# Patient Record
Sex: Female | Born: 1960 | Race: Black or African American | Hispanic: No | Marital: Married | Smoking: Current every day smoker
Health system: Southern US, Community
[De-identification: ages and names within clinical notes are randomized; demographics above are authoritative.]

## PROBLEM LIST (undated history)

## (undated) DIAGNOSIS — F319 Bipolar disorder, unspecified: Secondary | ICD-10-CM

## (undated) DIAGNOSIS — R569 Unspecified convulsions: Secondary | ICD-10-CM

## (undated) SURGERY — Surgical Case
Anesthesia: *Unknown

---

## 2013-08-31 LAB — COMPREHENSIVE METABOLIC PANEL
Alkaline Phosphatase: 115 U/L (ref 50–136)
Calcium, Total: 8.8 mg/dL (ref 8.5–10.1)
Co2: 23 mmol/L (ref 21–32)
EGFR (African American): >60
EGFR (Non-African Amer.): >60
Glucose: 84 mg/dL (ref 65–99)
Osmolality: 266 (ref 275–301)
Potassium: 3.7 mmol/L (ref 3.5–5.1)
SGOT(AST): 84 U/L — ABNORMAL HIGH (ref 15–37)
SGPT (ALT): 58 U/L (ref 12–78)

## 2013-08-31 LAB — CBC
HGB: 11.9 g/dL — ABNORMAL LOW (ref 12.0–16.0)
MCH: 26.1 pg (ref 26.0–34.0)
MCV: 81 fL (ref 80–100)
Platelet: 467 10*3/uL — ABNORMAL HIGH (ref 150–440)
RBC: 4.55 10*6/uL (ref 3.80–5.20)
WBC: 10.9 10*3/uL (ref 3.6–11.0)

## 2013-08-31 LAB — ETHANOL
Ethanol %: 0.127 % — ABNORMAL HIGH (ref 0.000–0.080)
Ethanol: 127 mg/dL

## 2013-08-31 LAB — URINALYSIS, COMPLETE
Bacteria: NONE SEEN
Bilirubin,UR: NEGATIVE
Glucose,UR: NEGATIVE mg/dL (ref 0–75)
Specific Gravity: 1.003 (ref 1.003–1.030)
Squamous Epithelial: 3

## 2013-08-31 LAB — DRUG SCREEN, URINE
Amphetamines, Ur Screen: NEGATIVE (ref ?–1000)
Cocaine Metabolite,Ur ~~LOC~~: NEGATIVE (ref ?–300)
Phencyclidine (PCP) Ur S: NEGATIVE (ref ?–25)
Tricyclic, Ur Screen: NEGATIVE (ref ?–1000)

## 2013-08-31 LAB — ACETAMINOPHEN LEVEL: Acetaminophen: <2 ug/mL

## 2013-08-31 LAB — VALPROIC ACID LEVEL: Valproic Acid: 18 ug/mL — ABNORMAL LOW

## 2013-09-01 ENCOUNTER — Inpatient Hospital Stay: Payer: Self-pay | Admitting: Psychiatry

## 2013-09-05 LAB — VALPROIC ACID LEVEL: Valproic Acid: 74 ug/mL
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
## 2020-01-23 ENCOUNTER — Ambulatory Visit
Admission: EM | Admit: 2020-01-23 | Discharge: 2020-01-23 | Disposition: A | Discharge status: Home / Self Care | Payer: Medicaid Other | Attending: Family Medicine | Admitting: Family Medicine

## 2020-01-23 ENCOUNTER — Other Ambulatory Visit: Payer: Self-pay

## 2020-01-23 ENCOUNTER — Encounter: Payer: Self-pay | Admitting: Emergency Medicine

## 2020-01-23 DIAGNOSIS — M25511 Pain in right shoulder: Secondary | ICD-10-CM | POA: Diagnosis not present

## 2020-01-23 DIAGNOSIS — T148XXA Other injury of unspecified body region, initial encounter: Secondary | ICD-10-CM

## 2020-01-23 DIAGNOSIS — M879 Osteonecrosis, unspecified: Secondary | ICD-10-CM | POA: Diagnosis not present

## 2020-01-23 HISTORY — DX: Bipolar disorder, unspecified: F31.9

## 2020-01-23 HISTORY — DX: Unspecified convulsions: R56.9

## 2020-01-23 MED ORDER — KETOROLAC TROMETHAMINE 10 MG PO TABS: 10.0000 mg | ORAL_TABLET | Freq: Four times a day (QID) | ORAL | 0 refills | Status: DC | PRN

## 2020-01-23 MED ORDER — TRIAMCINOLONE ACETONIDE 0.1 % EX OINT: 1.0000 "application " | TOPICAL_OINTMENT | Freq: Two times a day (BID) | CUTANEOUS | 0 refills | Status: DC
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
## 2020-01-23 NOTE — Discharge Instructions (Signed)
Rest.  Sling for comfort.  Pain medication as direct.  Please call [facility] [phone]) OR [facility] [phone]) for an appt.  Take care  Dr. Lacinda Axon

## 2020-03-22 ENCOUNTER — Ambulatory Visit: Payer: Medicaid Other | Attending: Family Medicine | Admitting: Physical Therapy

## 2020-04-01 ENCOUNTER — Ambulatory Visit: Payer: Medicaid Other | Admitting: Physical Therapy

## 2020-04-08 ENCOUNTER — Encounter: Payer: Medicaid Other | Admitting: Physical Therapy

## 2020-04-15 ENCOUNTER — Encounter: Payer: Medicaid Other | Admitting: Physical Therapy

## 2020-04-22 ENCOUNTER — Encounter: Payer: Medicaid Other | Admitting: Physical Therapy

## 2020-04-29 ENCOUNTER — Encounter: Payer: Medicaid Other | Admitting: Physical Therapy

## 2020-05-06 ENCOUNTER — Encounter: Payer: Medicaid Other | Admitting: Physical Therapy

## 2020-08-27 ENCOUNTER — Ambulatory Visit: Payer: Self-pay

## 2020-08-27 NOTE — Telephone Encounter (Signed)
Pt. Called to report injury to right foot on Tuesday, 10/26.  Stated a heavy wooden "carosel" rocking horse fell on her foot.  Stated she has not been able to get anywhere to have it x-rayed.  Doesn't have transportation until son comes home tonight from a trip.  Reported too painful to walk on; has been walking on side of foot when she goes to [place].  Has not looked at the foot, since the injury occurred.  Reported she put a double layer of sock on the foot, to help with the pain and swelling.  Reported the foot feels warm to touch.  Stated she cannot move the great toe at all.  Only able to move the 2nd-5th toes with her hand.  Has been applying ice to the foot at intervals.  Rated pain at "12" on 1-10 scale.  Pt. Stated she does not have any transportation to go to UC tonight, when advised.  Is expecting her son to come home later tonight.  Is unsure what time he will get home.  Does not want to go to ER.  Advised to continue to elevate the right foot, and apply ice for only 20 min. Intervals.  Advised to monitor right foot for swelling/ avoid having sock on too tight, as it could decrease blood flow to the foot.  Encouraged to seek ER treatment if pain becomes any worse, or if re-injury occurs.  Pt. Verb. Understanding.  (does not have a PCP; has appt. To establish care in December with a new PCP)      Reason for Disposition . [1] SEVERE pain AND [2] not improved 2 hours after pain medicine/ice packs    Injury to right foot on 10/26; child's rocking horse fell on top of foot.  Answer Assessment - Initial Assessment Questions 1. MECHANISM: "How did the injury happen?" (e.g., twisting injury, direct blow)      A carosel rocking horse fell on right foot 2. ONSET: "When did the injury happen?" (Minutes or hours ago)      08/24/20 3. LOCATION: "Where is the injury located?"      Right foot 4. APPEARANCE of INJURY: "What does the injury look like?"      "I haven't looked at it since the injury on  Tuesday." 5. WEIGHT-BEARING: "Can you put weight on that foot?" "Can you walk (four steps or more)?"       Unable to bear weight on right foot 6. SIZE: For cuts, bruises, or swelling, ask: "How large is it?" (e.g., inches or centimeters;  entire joint)      Denied any cuts; does not know about bruising; has not looked at  7. PAIN: "Is there pain?" If Yes, ask: "How bad is the pain?"    (e.g., Scale 1-10; or mild, moderate, severe)   - NONE (0): no pain.   - MILD (1-3): doesn't interfere with normal activities.    - MODERATE (4-7): interferes with normal activities (e.g., work or school) or awakens from sleep, limping.    - SEVERE (8-10): excruciating pain, unable to do any normal activities, unable to walk.      "12" on scale of 1-10 8. TETANUS: For any breaks in the skin, ask: "When was the last tetanus booster?"      9. OTHER SYMPTOMS: "Do you have any other symptoms?"      Pain right foot ; swelling.  Unsure about bruising; denied any break in skin. 10. PREGNANCY: "Is there any chance you are  pregnant?" "When was your last menstrual period?"       n/a  Protocols used: ANKLE AND FOOT INJURY-A-AH

## 2020-10-08 ENCOUNTER — Ambulatory Visit: Payer: Medicaid Other | Admitting: Family Medicine

## 2020-11-22 ENCOUNTER — Ambulatory Visit: Payer: Self-pay

## 2020-11-23 ENCOUNTER — Encounter: Payer: Self-pay | Admitting: Emergency Medicine

## 2020-11-26 ENCOUNTER — Ambulatory Visit: Payer: Medicaid Other | Admitting: Family Medicine

## 2020-12-27 ENCOUNTER — Ambulatory Visit: Payer: Medicaid Other | Admitting: Family Medicine

## 2021-04-21 ENCOUNTER — Telehealth: Payer: Self-pay

## 2021-04-21 NOTE — Telephone Encounter (Signed)
Received VM from patient. Attempted to call patient back- VM full and unable to leave message. Noted patient is seen by [facility]. Phone call placed to make them aware that patient is attempting to reach Palliative care. VM left

## 2022-05-15 ENCOUNTER — Emergency Department: Payer: Medicaid Other

## 2022-05-15 ENCOUNTER — Encounter: Payer: Self-pay | Admitting: *Deleted

## 2022-05-15 ENCOUNTER — Other Ambulatory Visit: Payer: Self-pay

## 2022-05-15 ENCOUNTER — Inpatient Hospital Stay
Admission: EM | Admit: 2022-05-15 | Discharge: 2022-05-19 | DRG: 871 | Disposition: A | Discharge status: Home / Self Care | Payer: Medicaid Other | Attending: Internal Medicine | Admitting: Internal Medicine

## 2022-05-15 DIAGNOSIS — Z95828 Presence of other vascular implants and grafts: Secondary | ICD-10-CM

## 2022-05-15 DIAGNOSIS — R1310 Dysphagia, unspecified: Secondary | ICD-10-CM | POA: Diagnosis present

## 2022-05-15 DIAGNOSIS — M542 Cervicalgia: Secondary | ICD-10-CM | POA: Diagnosis present

## 2022-05-15 DIAGNOSIS — R6510 Systemic inflammatory response syndrome (SIRS) of non-infectious origin without acute organ dysfunction: Secondary | ICD-10-CM

## 2022-05-15 DIAGNOSIS — G510 Bell's palsy: Secondary | ICD-10-CM | POA: Diagnosis present

## 2022-05-15 DIAGNOSIS — F17210 Nicotine dependence, cigarettes, uncomplicated: Secondary | ICD-10-CM | POA: Diagnosis present

## 2022-05-15 DIAGNOSIS — D75839 Thrombocytosis, unspecified: Secondary | ICD-10-CM | POA: Diagnosis present

## 2022-05-15 DIAGNOSIS — I959 Hypotension, unspecified: Secondary | ICD-10-CM | POA: Diagnosis present

## 2022-05-15 DIAGNOSIS — Z720 Tobacco use: Secondary | ICD-10-CM

## 2022-05-15 DIAGNOSIS — Z9181 History of falling: Secondary | ICD-10-CM

## 2022-05-15 DIAGNOSIS — R110 Nausea: Secondary | ICD-10-CM | POA: Diagnosis present

## 2022-05-15 DIAGNOSIS — Z931 Gastrostomy status: Secondary | ICD-10-CM

## 2022-05-15 DIAGNOSIS — F319 Bipolar disorder, unspecified: Secondary | ICD-10-CM | POA: Diagnosis present

## 2022-05-15 DIAGNOSIS — Z91138 Patient's unintentional underdosing of medication regimen for other reason: Secondary | ICD-10-CM

## 2022-05-15 DIAGNOSIS — R799 Abnormal finding of blood chemistry, unspecified: Secondary | ICD-10-CM

## 2022-05-15 DIAGNOSIS — C140 Malignant neoplasm of pharynx, unspecified: Secondary | ICD-10-CM

## 2022-05-15 DIAGNOSIS — F17290 Nicotine dependence, other tobacco product, uncomplicated: Secondary | ICD-10-CM | POA: Diagnosis present

## 2022-05-15 DIAGNOSIS — Z79899 Other long term (current) drug therapy: Secondary | ICD-10-CM

## 2022-05-15 DIAGNOSIS — Z1152 Encounter for screening for COVID-19: Secondary | ICD-10-CM

## 2022-05-15 DIAGNOSIS — R531 Weakness: Principal | ICD-10-CM

## 2022-05-15 DIAGNOSIS — D630 Anemia in neoplastic disease: Secondary | ICD-10-CM | POA: Diagnosis present

## 2022-05-15 DIAGNOSIS — Z79891 Long term (current) use of opiate analgesic: Secondary | ICD-10-CM

## 2022-05-15 DIAGNOSIS — Z681 Body mass index (BMI) 19 or less, adult: Secondary | ICD-10-CM

## 2022-05-15 DIAGNOSIS — Z85828 Personal history of other malignant neoplasm of skin: Secondary | ICD-10-CM

## 2022-05-15 DIAGNOSIS — C7989 Secondary malignant neoplasm of other specified sites: Secondary | ICD-10-CM | POA: Diagnosis present

## 2022-05-15 DIAGNOSIS — I82C12 Acute embolism and thrombosis of left internal jugular vein: Secondary | ICD-10-CM | POA: Diagnosis present

## 2022-05-15 DIAGNOSIS — E876 Hypokalemia: Secondary | ICD-10-CM | POA: Diagnosis present

## 2022-05-15 DIAGNOSIS — E860 Dehydration: Secondary | ICD-10-CM | POA: Diagnosis present

## 2022-05-15 DIAGNOSIS — I871 Compression of vein: Secondary | ICD-10-CM | POA: Diagnosis present

## 2022-05-15 DIAGNOSIS — R296 Repeated falls: Secondary | ICD-10-CM | POA: Diagnosis present

## 2022-05-15 DIAGNOSIS — G40909 Epilepsy, unspecified, not intractable, without status epilepticus: Secondary | ICD-10-CM | POA: Diagnosis present

## 2022-05-15 DIAGNOSIS — E43 Unspecified severe protein-calorie malnutrition: Secondary | ICD-10-CM | POA: Diagnosis present

## 2022-05-15 DIAGNOSIS — G893 Neoplasm related pain (acute) (chronic): Secondary | ICD-10-CM | POA: Diagnosis present

## 2022-05-15 DIAGNOSIS — Z9221 Personal history of antineoplastic chemotherapy: Secondary | ICD-10-CM

## 2022-05-15 DIAGNOSIS — I96 Gangrene, not elsewhere classified: Secondary | ICD-10-CM | POA: Diagnosis present

## 2022-05-15 DIAGNOSIS — E46 Unspecified protein-calorie malnutrition: Secondary | ICD-10-CM | POA: Insufficient documentation

## 2022-05-15 DIAGNOSIS — T402X6A Underdosing of other opioids, initial encounter: Secondary | ICD-10-CM | POA: Diagnosis present

## 2022-05-15 DIAGNOSIS — A419 Sepsis, unspecified organism: Principal | ICD-10-CM | POA: Diagnosis present

## 2022-05-15 DIAGNOSIS — C051 Malignant neoplasm of soft palate: Secondary | ICD-10-CM | POA: Diagnosis present

## 2022-05-15 LAB — CBC WITH DIFFERENTIAL/PLATELET
Abs Immature Granulocytes: 0.08 10*3/uL — ABNORMAL HIGH (ref 0.00–0.07)
Basophils Absolute: 0.1 10*3/uL (ref 0.0–0.1)
Basophils Relative: 0 %
Eosinophils Absolute: 0.1 10*3/uL (ref 0.0–0.5)
Eosinophils Relative: 1 %
HCT: 33.2 % — ABNORMAL LOW (ref 36.0–46.0)
Hemoglobin: 11.1 g/dL — ABNORMAL LOW (ref 12.0–15.0)
Immature Granulocytes: 1 %
Lymphocytes Relative: 8 %
Lymphs Abs: 1 10*3/uL (ref 0.7–4.0)
MCH: 32.7 pg (ref 26.0–34.0)
MCHC: 33.4 g/dL (ref 30.0–36.0)
MCV: 97.9 fL (ref 80.0–100.0)
Monocytes Absolute: 1.3 10*3/uL — ABNORMAL HIGH (ref 0.1–1.0)
Monocytes Relative: 11 %
Neutro Abs: 10 10*3/uL — ABNORMAL HIGH (ref 1.7–7.7)
Neutrophils Relative %: 79 %
Platelets: 463 10*3/uL — ABNORMAL HIGH (ref 150–400)
RBC: 3.39 MIL/uL — ABNORMAL LOW (ref 3.87–5.11)
RDW: 14.1 % (ref 11.5–15.5)
WBC: 12.5 10*3/uL — ABNORMAL HIGH (ref 4.0–10.5)
nRBC: 0 % (ref 0.0–0.2)

## 2022-05-15 LAB — COMPREHENSIVE METABOLIC PANEL
ALT: 7 U/L (ref 0–44)
AST: 13 U/L — ABNORMAL LOW (ref 15–41)
Albumin: 2.8 g/dL — ABNORMAL LOW (ref 3.5–5.0)
Alkaline Phosphatase: 137 U/L — ABNORMAL HIGH (ref 38–126)
Anion gap: 11 (ref 5–15)
BUN: 7 mg/dL — ABNORMAL LOW (ref 8–23)
CO2: 25 mmol/L (ref 22–32)
Calcium: 8.9 mg/dL (ref 8.9–10.3)
Chloride: 99 mmol/L (ref 98–111)
Creatinine, Ser: 0.71 mg/dL (ref 0.44–1.00)
GFR, Estimated: >60 mL/min (ref >60)
Glucose, Bld: 99 mg/dL (ref 70–99)
Potassium: 3.1 mmol/L — ABNORMAL LOW (ref 3.5–5.1)
Sodium: 135 mmol/L (ref 135–145)
Total Bilirubin: 0.3 mg/dL (ref 0.3–1.2)
Total Protein: 7.5 g/dL (ref 6.5–8.1)

## 2022-05-15 LAB — VALPROIC ACID LEVEL: Valproic Acid Lvl: <10 ug/mL — ABNORMAL LOW (ref 50.0–100.0)

## 2022-05-15 LAB — TROPONIN I (HIGH SENSITIVITY): Troponin I (High Sensitivity): 6 ng/L (ref <18)

## 2022-05-15 LAB — PROCALCITONIN: Procalcitonin: 13.13 ng/mL

## 2022-05-15 MED ORDER — SODIUM CHLORIDE 0.9 % IV SOLN: INTRAVENOUS | Status: DC

## 2022-05-15 MED ORDER — IOHEXOL 300 MG/ML  SOLN
75.0000 mL | Freq: Once | INTRAMUSCULAR | Status: AC | PRN
  Administered 2022-05-15: 75 mL via INTRAVENOUS

## 2022-05-15 MED ORDER — ACETAMINOPHEN 325 MG PO TABS
650.0000 mg | ORAL_TABLET | Freq: Once | ORAL | Status: AC
  Administered 2022-05-15: 650 mg via ORAL
  Filled 2022-05-15: qty 2

## 2022-05-15 MED ORDER — SODIUM CHLORIDE 0.9 % IV BOLUS (SEPSIS)
1000.0000 mL | Freq: Once | INTRAVENOUS | Status: AC
  Administered 2022-05-16: 1000 mL via INTRAVENOUS

## 2022-05-15 MED ORDER — MORPHINE SULFATE (PF) 4 MG/ML IV SOLN
4.0000 mg | Freq: Once | INTRAVENOUS | Status: AC
  Administered 2022-05-15: 4 mg via INTRAVENOUS
  Filled 2022-05-15: qty 1

## 2022-05-15 MED ORDER — FENTANYL CITRATE PF 50 MCG/ML IJ SOSY
50.0000 ug | PREFILLED_SYRINGE | Freq: Once | INTRAMUSCULAR | Status: AC
  Administered 2022-05-15: 50 ug via INTRAVENOUS
  Filled 2022-05-15: qty 1

## 2022-05-15 MED ORDER — SODIUM CHLORIDE 0.9 % IV BOLUS
1000.0000 mL | Freq: Once | INTRAVENOUS | Status: AC
  Administered 2022-05-15: 1000 mL via INTRAVENOUS

## 2022-05-15 NOTE — ED Provider Notes (Signed)
[facility] Provider Note    Event Date/Time   First MD Initiated Contact with Patient 05/15/22 1812     (approximate)  History   Chief Complaint: Fall  HPI  Christina Porter is a 61 y.o. female with a past medical history of bipolar, seizure disorder, stage IV throat cancer presents to the emergency department for weakness and falls.  According to the patient she has been feeling more weak at home states she is also had seizures at home.  States she has had increased falls recently as well.  Patient states she has a home health nurse said she had low blood pressure and referred her to the emergency department.  Patient does have a left facial droop and some swelling to the left face/throat that has been present for some time per family.  Patient states she was on chemotherapy and radiation but is no longer receiving treatment.  Patient's son is here with the patient.  Blood pressure currently 72/58.  Physical Exam   Triage Vital Signs: ED Triage Vitals  Enc Vitals Group     BP 05/15/22 1737 (!) 72/58     Pulse Rate 05/15/22 1737 (!) 116     Resp 05/15/22 1737 17     Temp 05/15/22 1737 98.7 F (37.1 C)     Temp Source 05/15/22 1737 Oral     SpO2 05/15/22 1737 98 %     Weight 05/15/22 1740 84 lb (38.1 kg)     Height 05/15/22 1740 '5\' 2"'$  (1.575 m)     Head Circumference --      Peak Flow --      Pain Score 05/15/22 1740 10     Pain Loc --      Pain Edu? --      Excl. in [place]? --     Most recent vital signs: Vitals:   05/15/22 1737  BP: (!) 72/58  Pulse: (!) 116  Resp: 17  Temp: 98.7 F (37.1 C)  SpO2: 98%    General: Awake, no distress.  CV:  Good peripheral perfusion.  Regular rate and rhythm  Resp:  Normal effort.  Equal breath sounds bilaterally.  Abd:  No distention.  Soft, nontender.  No rebound or guarding. Other:  Patient does have swelling of the left throat and jaw area which has been ongoing for some time per patient.   ED  Results / Procedures / Treatments   EKG  EKG viewed and interpreted by myself shows sinus tachycardia at 116 bpm with a narrow QRS, normal axis, prolonged QTc otherwise normal intervals, nonspecific but no concerning ST changes.  RADIOLOGY  I have reviewed and interpreted the CT images of the head I do not see any large bleed on my evaluation. Radiology is read the CT is no acute process. No acute cervical spine finding. Chest x-ray read as negative  MEDICATIONS ORDERED IN ED: Medications  sodium chloride 0.9 % bolus 1,000 mL (has no administration in time range)     IMPRESSION / MDM / ASSESSMENT AND PLAN / ED COURSE  I reviewed the triage vital signs and the nursing notes.  Patient's presentation is most consistent with acute presentation with potential threat to life or bodily function.  Patient presents emergency department for increased falls weakness found to be hypotensive 72/58.  Patient is complaining of chronic pain as well she takes oxycodone at home.  We will IV hydrate, treat pain.  We will check labs given the  increased falls and complaint of seizure we will obtain CT imaging of the head and C-spine will also obtain a chest x-ray.  CT scans of the head C-spine are negative.  Chest x-ray is negative.  Lab work is pending.  We will IV hydrate and treat pain once an IV is established.  We will continue to closely monitor in the emergency department.  Differential is quite broad but would include metabolic or electrolyte abnormality, dehydration, ACS.  Patient's blood pressure has improved nicely with fluids with 1 L fluid now it is currently 258 systolic during my evaluation.  Patient's chemistry is reassuring including normal renal function.  CBC shows slight leukocytosis otherwise no concerning findings.  CT scan of the head and chest x-ray do not show any acute findings.  CT scan of the C-spine does show what appears to be changes in the soft tissue of the neck we will repeat  CT imaging of the neck with contrast to look at the soft tissues.  Given the patient's falls and off balance sensation we will obtain an MRI of the brain to evaluate for metastatic disease.  Patient agreeable to plan of care.  She does state mild headache currently we will dose Tylenol and continue to closely monitor.  Urinalysis is pending as well, troponin pending  I reviewed the patient's last admission note from June at [facility].  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  Patient CT scan today continues to show similar findings.  Patient is feeling better after IV fluids, blood pressures improved, labs overall reassuring.  We will continue to closely monitor we will obtain an MRI with without to ensure no metastatic spread of disease given the patient's new weakness which might necessitate hospitalization otherwise I believe the patient would likely be able to be discharged home with continued home health therapy and follow-up with her doctor and oncology team.  FINAL CLINICAL IMPRESSION(S) / ED DIAGNOSES   Weakness Falls Hypotension   Note:  This document was prepared using Dragon voice recognition software and may include unintentional dictation errors.   Harvest Dark, MD 05/15/22 2311

## 2022-05-15 NOTE — ED Triage Notes (Addendum)
Pt comes via EMs from home with c/o hypotension and out of meds. EMS reports BP-93/64. Also states pt has been out of pain meds for two days. Pt has masses on in mouth per EMs. Pt has droop noted and per EMS that is her normal baseline.

## 2022-05-15 NOTE — ED Triage Notes (Signed)
Pt brought in via ems from home with recent falls, hypotension, hx throat cancer.  Pt has slurred speech and facial droop for 1 week per pt.  Pt reports a headache and difficulty walking.  Pt alert.  Pt has a port.

## 2022-05-15 NOTE — ED Notes (Signed)
Patient transported to MRI at this time. 

## 2022-05-15 NOTE — ED Provider Triage Note (Signed)
  Emergency Medicine Provider Triage Evaluation Note  Christina Porter , a 61 y.o.female,  was evaluated in triage.  Pt complains of recent falls.  Patient states that she has had seizures in the past few days and has had her head several times.  She states approximately a week ago, she noticed the left side of her face was not moving like it should, stating that she now talks " stupid".  She states that she is also out of her pain medication and would like refills.   Review of Systems  Positive: Left-sided facial droop, syncope/seizure, Negative: Denies fever, chest pain, vomiting  Physical Exam   Vitals:   05/15/22 1737  BP: (!) 72/58  Pulse: (!) 116  Resp: 17  Temp: 98.7 F (37.1 C)  SpO2: 98%   Gen:   Awake, no distress   Resp:  Normal effort  MSK:   Moves extremities without difficulty  Other:  Left-sided facial droop, decreased sensation on left side of face.  No neurological deficits in the upper or lower extremities.  Medical Decision Making  Given the patient's initial medical screening exam, the following diagnostic evaluation has been ordered. The patient will be placed in the appropriate treatment space, once one is available, to complete the evaluation and treatment. I have discussed the plan of care with the patient and I have advised the patient that an ED physician or mid-level practitioner will reevaluate their condition after the test results have been received, as the results may give them additional insight into the type of treatment they may need.    Diagnostics: Labs, UA, head/neck CT  Treatments: IV fluids   Teodoro Spray, [place] 05/15/22 1747

## 2022-05-15 NOTE — ED Provider Notes (Incomplete)
11:15 PM  Assumed care at shift change.  Patient here with history of metastatic throat cancer status post chemoradiation in 2022 with recent findings of recurrence in June 2023 followed by heme-onc at [facility] who presents to the emergency department with generalized weakness.  Patient did have hypotension which resolved with a liter of IV fluids.  No infectious symptoms.  No fever here but did have an oral temp of 99.4.  Has been slightly tachycardic.  White count of 12,000.  We will add on a procalcitonin.  Urinalysis, cultures pending.  MRI brain pending to evaluate for metastasis.  1:22 AM  Pt's MRI brain reviewed/interpreted by myself and radiologist and shows no metastasis or other acute abnormality.  Her procalcitonin has come back elevated at 13.  I have added on blood cultures and will give cefepime and vancomycin for possible sepsis.  Chest x-ray is clear.  Urine shows no sign of infection.  We will admit to the hospitalist.  1:43 AM  Consulted and discussed patient's case with hospitalist, Dr. Sidney Ace.  I have recommended admission and consulting physician agrees and will place admission orders.  Patient (and family if present) agree with this plan.   Blood pressure currently 117/73 with a heart rate of 106.  I reviewed all nursing notes, vitals, pertinent previous records.  All labs, EKGs, imaging ordered have been independently reviewed and interpreted by myself.    CRITICAL CARE Performed by: Pryor Curia   Total critical care time: 45 minutes  Critical care time was exclusive of separately billable procedures and treating other patients.  Critical care was necessary to treat or prevent imminent or life-threatening deterioration.  Critical care was time spent personally by me on the following activities: development of treatment plan with patient and/or surrogate as well as nursing, discussions with consultants, evaluation of patient's response to treatment, examination of  patient, obtaining history from patient or surrogate, ordering and performing treatments and interventions, ordering and review of laboratory studies, ordering and review of radiographic studies, pulse oximetry and re-evaluation of patient's condition.     Armiyah Capron, Delice Bison, DO 05/16/22 [phone]

## 2022-05-16 ENCOUNTER — Telehealth: Payer: Self-pay

## 2022-05-16 DIAGNOSIS — R296 Repeated falls: Secondary | ICD-10-CM | POA: Diagnosis present

## 2022-05-16 DIAGNOSIS — D630 Anemia in neoplastic disease: Secondary | ICD-10-CM | POA: Diagnosis present

## 2022-05-16 DIAGNOSIS — E860 Dehydration: Secondary | ICD-10-CM | POA: Diagnosis present

## 2022-05-16 DIAGNOSIS — Z1152 Encounter for screening for COVID-19: Secondary | ICD-10-CM | POA: Diagnosis not present

## 2022-05-16 DIAGNOSIS — G893 Neoplasm related pain (acute) (chronic): Secondary | ICD-10-CM | POA: Diagnosis present

## 2022-05-16 DIAGNOSIS — E46 Unspecified protein-calorie malnutrition: Secondary | ICD-10-CM | POA: Insufficient documentation

## 2022-05-16 DIAGNOSIS — C140 Malignant neoplasm of pharynx, unspecified: Secondary | ICD-10-CM

## 2022-05-16 DIAGNOSIS — I959 Hypotension, unspecified: Secondary | ICD-10-CM

## 2022-05-16 DIAGNOSIS — G40909 Epilepsy, unspecified, not intractable, without status epilepticus: Secondary | ICD-10-CM

## 2022-05-16 DIAGNOSIS — R799 Abnormal finding of blood chemistry, unspecified: Secondary | ICD-10-CM

## 2022-05-16 DIAGNOSIS — C051 Malignant neoplasm of soft palate: Secondary | ICD-10-CM | POA: Diagnosis present

## 2022-05-16 DIAGNOSIS — E876 Hypokalemia: Secondary | ICD-10-CM

## 2022-05-16 DIAGNOSIS — C7989 Secondary malignant neoplasm of other specified sites: Secondary | ICD-10-CM | POA: Diagnosis present

## 2022-05-16 DIAGNOSIS — I96 Gangrene, not elsewhere classified: Secondary | ICD-10-CM | POA: Diagnosis present

## 2022-05-16 DIAGNOSIS — A419 Sepsis, unspecified organism: Principal | ICD-10-CM

## 2022-05-16 DIAGNOSIS — F319 Bipolar disorder, unspecified: Secondary | ICD-10-CM | POA: Diagnosis present

## 2022-05-16 DIAGNOSIS — R110 Nausea: Secondary | ICD-10-CM | POA: Diagnosis present

## 2022-05-16 DIAGNOSIS — R1310 Dysphagia, unspecified: Secondary | ICD-10-CM | POA: Diagnosis present

## 2022-05-16 DIAGNOSIS — D75839 Thrombocytosis, unspecified: Secondary | ICD-10-CM | POA: Diagnosis present

## 2022-05-16 DIAGNOSIS — I871 Compression of vein: Secondary | ICD-10-CM | POA: Diagnosis present

## 2022-05-16 DIAGNOSIS — I82C12 Acute embolism and thrombosis of left internal jugular vein: Secondary | ICD-10-CM

## 2022-05-16 DIAGNOSIS — T402X6A Underdosing of other opioids, initial encounter: Secondary | ICD-10-CM | POA: Diagnosis present

## 2022-05-16 DIAGNOSIS — Z720 Tobacco use: Secondary | ICD-10-CM

## 2022-05-16 DIAGNOSIS — M542 Cervicalgia: Secondary | ICD-10-CM | POA: Diagnosis present

## 2022-05-16 DIAGNOSIS — Z681 Body mass index (BMI) 19 or less, adult: Secondary | ICD-10-CM | POA: Diagnosis not present

## 2022-05-16 DIAGNOSIS — E43 Unspecified severe protein-calorie malnutrition: Secondary | ICD-10-CM | POA: Insufficient documentation

## 2022-05-16 DIAGNOSIS — Z95828 Presence of other vascular implants and grafts: Secondary | ICD-10-CM | POA: Diagnosis not present

## 2022-05-16 LAB — BASIC METABOLIC PANEL
Anion gap: 3 — ABNORMAL LOW (ref 5–15)
BUN: 6 mg/dL — ABNORMAL LOW (ref 8–23)
CO2: 26 mmol/L (ref 22–32)
Calcium: 7.8 mg/dL — ABNORMAL LOW (ref 8.9–10.3)
Chloride: 105 mmol/L (ref 98–111)
Creatinine, Ser: 0.5 mg/dL (ref 0.44–1.00)
GFR, Estimated: >60 mL/min (ref >60)
Glucose, Bld: 125 mg/dL — ABNORMAL HIGH (ref 70–99)
Potassium: 3.4 mmol/L — ABNORMAL LOW (ref 3.5–5.1)
Sodium: 134 mmol/L — ABNORMAL LOW (ref 135–145)

## 2022-05-16 LAB — BLOOD CULTURE ID PANEL (REFLEXED) - BCID2

## 2022-05-16 LAB — PROTIME-INR
INR: 1.1 (ref 0.8–1.2)
Prothrombin Time: 14.2 seconds (ref 11.4–15.2)

## 2022-05-16 LAB — HIV ANTIBODY (ROUTINE TESTING W REFLEX): HIV Screen 4th Generation wRfx: NONREACTIVE

## 2022-05-16 LAB — URINALYSIS, ROUTINE W REFLEX MICROSCOPIC
Bacteria, UA: NONE SEEN
Bilirubin Urine: NEGATIVE
Glucose, UA: NEGATIVE mg/dL
Hgb urine dipstick: NEGATIVE
Ketones, ur: NEGATIVE mg/dL
Nitrite: NEGATIVE
Protein, ur: NEGATIVE mg/dL
Specific Gravity, Urine: 1.028 (ref 1.005–1.030)
pH: 6 (ref 5.0–8.0)

## 2022-05-16 LAB — MAGNESIUM
Magnesium: 2.1 mg/dL (ref 1.7–2.4)
Magnesium: 1.5 mg/dL — ABNORMAL LOW (ref 1.7–2.4)

## 2022-05-16 LAB — PHOSPHORUS: Phosphorus: 2.6 mg/dL (ref 2.5–4.6)

## 2022-05-16 LAB — PROCALCITONIN: Procalcitonin: 7.16 ng/mL

## 2022-05-16 LAB — CBC
HCT: 28.5 % — ABNORMAL LOW (ref 36.0–46.0)
Hemoglobin: 9.5 g/dL — ABNORMAL LOW (ref 12.0–15.0)
MCH: 33.1 pg (ref 26.0–34.0)
MCHC: 33.3 g/dL (ref 30.0–36.0)
MCV: 99.3 fL (ref 80.0–100.0)
Platelets: 380 10*3/uL (ref 150–400)
RBC: 2.87 MIL/uL — ABNORMAL LOW (ref 3.87–5.11)
RDW: 13.8 % (ref 11.5–15.5)
WBC: 10.8 10*3/uL — ABNORMAL HIGH (ref 4.0–10.5)
nRBC: 0 % (ref 0.0–0.2)

## 2022-05-16 LAB — GLUCOSE, CAPILLARY
Glucose-Capillary: 99 mg/dL (ref 70–99)
Glucose-Capillary: 111 mg/dL — ABNORMAL HIGH (ref 70–99)

## 2022-05-16 LAB — CORTISOL-AM, BLOOD: Cortisol - AM: 20 ug/dL (ref 6.7–22.6)

## 2022-05-16 LAB — SARS CORONAVIRUS 2 BY RT PCR: SARS Coronavirus 2 by RT PCR: NEGATIVE

## 2022-05-16 MED ORDER — TRAZODONE HCL 50 MG PO TABS: 25.0000 mg | ORAL_TABLET | Freq: Every evening | ORAL | Status: DC | PRN

## 2022-05-16 MED ORDER — CHLORHEXIDINE GLUCONATE CLOTH 2 % EX PADS
6.0000 | MEDICATED_PAD | Freq: Every day | CUTANEOUS | Status: DC
Start: 2022-05-17 — End: 2022-05-19
  Administered 2022-05-17 – 2022-05-19 (×3): 6 via TOPICAL

## 2022-05-16 MED ORDER — VANCOMYCIN HCL 500 MG/100ML IV SOLN: 500.0000 mg | INTRAVENOUS | Status: DC

## 2022-05-16 MED ORDER — FLUTICASONE PROPIONATE 50 MCG/ACT NA SUSP
2.0000 | Freq: Every day | NASAL | Status: DC
  Filled 2022-05-16: qty 16

## 2022-05-16 MED ORDER — VANCOMYCIN HCL IN DEXTROSE 1-5 GM/200ML-% IV SOLN
1000.0000 mg | Freq: Once | INTRAVENOUS | Status: AC
Start: 2022-05-16 — End: 2022-05-16
  Administered 2022-05-16: 1000 mg via INTRAVENOUS
  Filled 2022-05-16: qty 200

## 2022-05-16 MED ORDER — VANCOMYCIN HCL IN DEXTROSE 1-5 GM/200ML-% IV SOLN
INTRAVENOUS | Status: AC
  Administered 2022-05-16: 1000 mg via INTRAVENOUS
  Filled 2022-05-16: qty 200

## 2022-05-16 MED ORDER — SODIUM CHLORIDE 0.9 % IV SOLN
3.0000 g | Freq: Four times a day (QID) | INTRAVENOUS | Status: DC
  Administered 2022-05-16 – 2022-05-19 (×10): 3 g via INTRAVENOUS
  Filled 2022-05-16 (×2): qty 3
  Filled 2022-05-16 (×4): qty 8
  Filled 2022-05-16 (×2): qty 3
  Filled 2022-05-16: qty 8
  Filled 2022-05-16: qty 3
  Filled 2022-05-16 (×2): qty 8
  Filled 2022-05-16: qty 3
  Filled 2022-05-16: qty 8

## 2022-05-16 MED ORDER — ENOXAPARIN SODIUM 30 MG/0.3ML IJ SOSY
30.0000 mg | PREFILLED_SYRINGE | Freq: Every day | INTRAMUSCULAR | Status: DC
  Administered 2022-05-16 – 2022-05-18 (×4): 30 mg via SUBCUTANEOUS
  Filled 2022-05-16 (×4): qty 0.3

## 2022-05-16 MED ORDER — MORPHINE SULFATE (PF) 4 MG/ML IV SOLN
4.0000 mg | INTRAVENOUS | Status: DC | PRN
  Administered 2022-05-16 – 2022-05-18 (×2): 4 mg via INTRAVENOUS
  Filled 2022-05-16 (×3): qty 1

## 2022-05-16 MED ORDER — ACETAMINOPHEN 325 MG PO TABS: 650.0000 mg | ORAL_TABLET | Freq: Four times a day (QID) | ORAL | Status: DC | PRN

## 2022-05-16 MED ORDER — MORPHINE SULFATE ER 15 MG PO TBCR
15.0000 mg | EXTENDED_RELEASE_TABLET | Freq: Three times a day (TID) | ORAL | Status: DC
  Administered 2022-05-16 – 2022-05-19 (×10): 15 mg via ORAL
  Filled 2022-05-16 (×10): qty 1

## 2022-05-16 MED ORDER — TRAZODONE HCL 100 MG PO TABS
100.0000 mg | ORAL_TABLET | Freq: Every day | ORAL | Status: DC
  Administered 2022-05-16 – 2022-05-18 (×4): 100 mg via ORAL
  Filled 2022-05-16 (×4): qty 1

## 2022-05-16 MED ORDER — PROSOURCE TF PO LIQD
45.0000 mL | Freq: Two times a day (BID) | ORAL | Status: DC
Start: 2022-05-16 — End: 2022-05-19
  Administered 2022-05-16 – 2022-05-19 (×6): 45 mL
  Filled 2022-05-16 (×7): qty 45

## 2022-05-16 MED ORDER — VITAL HIGH PROTEIN PO LIQD: 1000.0000 mL | ORAL | Status: DC

## 2022-05-16 MED ORDER — ONDANSETRON HCL 4 MG/2ML IJ SOLN: 4.0000 mg | Freq: Four times a day (QID) | INTRAMUSCULAR | Status: DC | PRN

## 2022-05-16 MED ORDER — GUAIFENESIN ER 600 MG PO TB12
600.0000 mg | ORAL_TABLET | Freq: Two times a day (BID) | ORAL | Status: DC
  Administered 2022-05-16 – 2022-05-18 (×6): 600 mg via ORAL
  Filled 2022-05-16 (×6): qty 1

## 2022-05-16 MED ORDER — POTASSIUM CHLORIDE 20 MEQ PO PACK
40.0000 meq | PACK | Freq: Once | ORAL | Status: AC
Start: 2022-05-16 — End: 2022-05-16
  Administered 2022-05-16: 40 meq
  Filled 2022-05-16: qty 2

## 2022-05-16 MED ORDER — SODIUM CHLORIDE 0.9 % IV SOLN: 2.0000 g | Freq: Two times a day (BID) | INTRAVENOUS | Status: DC

## 2022-05-16 MED ORDER — OSMOLITE 1.5 CAL PO LIQD
237.0000 mL | Freq: Four times a day (QID) | ORAL | Status: DC
Start: 2022-05-16 — End: 2022-05-19
  Administered 2022-05-16 – 2022-05-19 (×10): 237 mL

## 2022-05-16 MED ORDER — METRONIDAZOLE 500 MG/100ML IV SOLN
500.0000 mg | Freq: Two times a day (BID) | INTRAVENOUS | Status: DC
  Administered 2022-05-16: 500 mg via INTRAVENOUS
  Filled 2022-05-16: qty 100

## 2022-05-16 MED ORDER — OXYCODONE HCL 5 MG PO TABS
10.0000 mg | ORAL_TABLET | Freq: Once | ORAL | Status: AC
  Administered 2022-05-16: 10 mg via ORAL
  Filled 2022-05-16: qty 2

## 2022-05-16 MED ORDER — POTASSIUM CHLORIDE 20 MEQ PO PACK
40.0000 meq | PACK | Freq: Once | ORAL | Status: AC
  Administered 2022-05-16: 40 meq via ORAL
  Filled 2022-05-16: qty 2

## 2022-05-16 MED ORDER — POTASSIUM CHLORIDE IN NACL 20-0.9 MEQ/L-% IV SOLN
INTRAVENOUS | Status: DC
  Filled 2022-05-16: qty 1000

## 2022-05-16 MED ORDER — FREE WATER
150.0000 mL | Freq: Four times a day (QID) | Status: DC
  Administered 2022-05-16 – 2022-05-19 (×11): 150 mL

## 2022-05-16 MED ORDER — OXYCODONE HCL 5 MG PO TABS
10.0000 mg | ORAL_TABLET | ORAL | Status: DC | PRN
  Administered 2022-05-17 – 2022-05-19 (×5): 10 mg via ORAL
  Filled 2022-05-16 (×5): qty 2

## 2022-05-16 MED ORDER — ACETAMINOPHEN 325 MG PO TABS
650.0000 mg | ORAL_TABLET | ORAL | Status: DC | PRN
  Administered 2022-05-17 – 2022-05-19 (×9): 650 mg via ORAL
  Filled 2022-05-16 (×10): qty 2

## 2022-05-16 MED ORDER — CEFEPIME HCL 2 G IV SOLR: 2.0000 g | Freq: Once | INTRAVENOUS | Status: AC

## 2022-05-16 MED ORDER — ADULT MULTIVITAMIN W/MINERALS CH
1.0000 | ORAL_TABLET | Freq: Every day | ORAL | Status: DC
  Administered 2022-05-16 – 2022-05-19 (×4): 1 via ORAL
  Filled 2022-05-16 (×4): qty 1

## 2022-05-16 MED ORDER — ACETAMINOPHEN 650 MG RE SUPP: 650.0000 mg | Freq: Four times a day (QID) | RECTAL | Status: DC | PRN

## 2022-05-16 MED ORDER — MAGNESIUM HYDROXIDE 400 MG/5ML PO SUSP: 30.0000 mL | Freq: Every day | ORAL | Status: DC | PRN

## 2022-05-16 MED ORDER — VANCOMYCIN HCL IN DEXTROSE 1-5 GM/200ML-% IV SOLN: 1000.0000 mg | Freq: Once | INTRAVENOUS | Status: DC

## 2022-05-16 MED ORDER — POTASSIUM CHLORIDE CRYS ER 20 MEQ PO TBCR
EXTENDED_RELEASE_TABLET | ORAL | Status: AC
  Filled 2022-05-16: qty 2

## 2022-05-16 MED ORDER — GADOBUTROL 1 MMOL/ML IV SOLN
3.0000 mL | Freq: Once | INTRAVENOUS | Status: AC | PRN
  Administered 2022-05-16: 3 mL via INTRAVENOUS

## 2022-05-16 MED ORDER — OXYCODONE HCL 5 MG PO TABS
10.0000 mg | ORAL_TABLET | Freq: Three times a day (TID) | ORAL | Status: DC | PRN
  Administered 2022-05-16: 10 mg via ORAL
  Filled 2022-05-16: qty 2

## 2022-05-16 MED ORDER — QUETIAPINE FUMARATE 25 MG PO TABS
100.0000 mg | ORAL_TABLET | Freq: Every day | ORAL | Status: DC
  Administered 2022-05-16 – 2022-05-18 (×4): 100 mg via ORAL
  Filled 2022-05-16 (×4): qty 4

## 2022-05-16 MED ORDER — ONDANSETRON HCL 4 MG PO TABS: 4.0000 mg | ORAL_TABLET | Freq: Four times a day (QID) | ORAL | Status: DC | PRN

## 2022-05-16 MED ORDER — POTASSIUM CHLORIDE CRYS ER 20 MEQ PO TBCR
40.0000 meq | EXTENDED_RELEASE_TABLET | Freq: Once | ORAL | Status: AC
  Administered 2022-05-16: 40 meq via ORAL

## 2022-05-16 MED ORDER — PIPERACILLIN-TAZOBACTAM 3.375 G IVPB
3.3750 g | Freq: Three times a day (TID) | INTRAVENOUS | Status: DC
Start: 2022-05-16 — End: 2022-05-16
  Administered 2022-05-16: 3.375 g via INTRAVENOUS
  Filled 2022-05-16: qty 50

## 2022-05-16 MED ORDER — MORPHINE SULFATE ER 15 MG PO TBCR
15.0000 mg | EXTENDED_RELEASE_TABLET | Freq: Once | ORAL | Status: AC
  Administered 2022-05-16: 15 mg via ORAL
  Filled 2022-05-16 (×2): qty 1

## 2022-05-16 MED ORDER — SODIUM CHLORIDE 0.9 % IV SOLN
INTRAVENOUS | Status: AC
  Administered 2022-05-16: 2 g via INTRAVENOUS
  Filled 2022-05-16: qty 12.5

## 2022-05-16 MED ORDER — GABAPENTIN 300 MG PO CAPS
600.0000 mg | ORAL_CAPSULE | Freq: Three times a day (TID) | ORAL | Status: DC
  Administered 2022-05-16: 600 mg via ORAL
  Filled 2022-05-16: qty 2

## 2022-05-16 NOTE — Assessment & Plan Note (Signed)
-   1/4 bottles from admission blood cultures growing Staph (species pending); for now is presumed contaminate (noted from port a cath)

## 2022-05-16 NOTE — Assessment & Plan Note (Addendum)
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  In setting of underlying neck cancer, was some concern for septic thrombophlebitis.  She is however asymptomatic with no complaints of distant organ involvement and has no high-grade fever or significant leukocytosis. -Have asked for ID evaluation for further opinion as well.  Currently low suspicion also -Antibiotics have been transitioned to Unasyn and we will continue to follow cultures -Last CT neck noted in care everywhere from 04/18/2022: Left IJ not visualized "similar to prior, likely reflecting chronic occlusion or compression". -Also discussed with oncology in house and no recommendation for anticoagulation.  She will have close outpatient follow-up with oncology

## 2022-05-16 NOTE — Assessment & Plan Note (Addendum)
repleted ?

## 2022-05-16 NOTE — Consult Note (Signed)
Pharmacy Antibiotic Note  Christina Porter is a 61 y.o. female admitted on 05/15/2022 with sepsis.  Pharmacy has been consulted for cefepime and Vancomycin dosing.  Plan: Cefepime 2 gram Q12H Vancomycin 1000 mg x 1 given in ED.  Initiate Vancomycin 500 mg Q24H. Goal AUC 400-550 Estimated AUC 441 Scr 0.8, TBW, Vd 0.72   Height: '5\' 2"'$  (157.5 cm) Weight: 38.1 kg (84 lb) IBW/kg (Calculated) : 50.1  Temp (24hrs), Avg:99.1 F (37.3 C), Min:98.7 F (37.1 C), Max:99.4 F (37.4 C)  Recent Labs  Lab 05/15/22 1758  WBC 12.5*  CREATININE 0.71    Estimated Creatinine Clearance: 44.4 mL/min (by C-G formula based on SCr of 0.71 mg/dL).    No Known Allergies  Antimicrobials this admission: 7/18 cefepime >>  7/18 Metronidazole >>  7/18 Vancomycin >>  Dose adjustments this admission:   Microbiology results: 7/17 BCx: sent 7/17 UCx: sent    Thank you for allowing pharmacy to be a part of this patient's care.  Dorothe Pea, PharmD, BCPS Clinical Pharmacist   05/16/2022 2:12 AM

## 2022-05-16 NOTE — Assessment & Plan Note (Addendum)
-   Leukocytosis, tachycardia, tachypnea, suspected source of necrotic neck mass which is known - Initially started on vancomycin, cefepime, Flagyl and transitioned to Unasyn after ID evaluation.  Augmentin prescribed at discharge to complete total of 2-week course -Blood cultures remained negative at time of discharge - Procalcitonin downtrending

## 2022-05-16 NOTE — Assessment & Plan Note (Addendum)
-   Prior notes reviewed in epic, notably June 2023 admission at [facility] when she was also evaluated by neurology.  She has mention of seizure disorder history but notes indicate not on medications. Notes indicate patient complains of "seziures" but with further investigation it seems to be "sharp electrocuting pain" and no loss of consciousness or postictal periods - to myself, she is also describing that the neck pain makes her "jolt and twitch quickly" because of the pain. She is alert and aware during these events and has full memory of them and they are not followed by any periods of lethargy, weakness, confusion to suggest a post-ictal state -Pharmacy has also reviewed prescription fill history and Depakote has not been filled in about 2 years (since 2021) -Patient on no medications which is consistent with epic chart review -I do not think she is having clinical seizures; if suspicion still increased and/or she presents soon after episode, she may benefit from continuous EEG possibly

## 2022-05-16 NOTE — Consult Note (Signed)
Pharmacy Antibiotic Note  Christina Porter is a 61 y.o. female admitted on 05/15/2022 with sepsis.  Pharmacy has been consulted for unasyn dosing.  Plan: Unasyn 3gm IV every 6 hours Continue to monitor renal function and dose accordingly  Height: '5\' 2"'$  (157.5 cm) Weight: 38.1 kg (84 lb) IBW/kg (Calculated) : 50.1  Temp (24hrs), Avg:98.8 F (37.1 C), Min:98.1 F (36.7 C), Max:99.4 F (37.4 C)  Recent Labs  Lab 05/15/22 1758 05/16/22 0519  WBC 12.5* 10.8*  CREATININE 0.71 0.50    Estimated Creatinine Clearance: 44.4 mL/min (by C-G formula based on SCr of 0.5 mg/dL).    No Known Allergies  Antimicrobials this admission: 7/18 Zosyn >> 7/18 7/18 Unasyn >>     Microbiology results: 7/17 BCx: GPC 7/17 BCID: staph species detected (1/4 anaerobic only)   Thank you for allowing pharmacy to be a part of this patient's care.  Darrick Penna 05/16/2022 7:10 PM

## 2022-05-16 NOTE — Assessment & Plan Note (Signed)
-   RD consulted - continue FLD and resume TF

## 2022-05-16 NOTE — H&P (Addendum)
PATIENT NAME: Christina Porter    MR#:  [id number]  DATE OF BIRTH:  1961/06/21  DATE OF ADMISSION:  05/15/2022  PRIMARY CARE PHYSICIAN: Neomia Dear, MD   Patient is coming from: Home  REQUESTING/REFERRING PHYSICIAN: Ward, Delice Bison, DO  CHIEF COMPLAINT:   Chief Complaint  Patient presents with   Fall    HISTORY OF PRESENT ILLNESS:  Christina Porter is a 61 y.o. African-American female with medical history significant for bipolar 1 disorder, seizures and metastatic throat cancer which has been managed at [facility] in 2022 and recently had a relapse last month.  Palliative care has been thought but was not seen by palliative yet and she wants to still be full code.  She has been having generalized weakness and believes that she had a seizure at home.  She has been having increasing falls recently as well.  Her home health nurse noticed her blood pressure was low and therefore she was sent to the ER.  She has a left facial droop and swelling to left face/throat which has been there for a while she has been on chemotherapy and radiotherapy and is no longer receiving treatment.  She admits to nausea without vomiting.  She has been having mild dyspnea and wheezing.  She admits to dizziness and lightheadedness with her hypotension.  No dysuria, oliguria or hematuria or flank pain.  She has chills but denies any measured fever.  ED Course: Upon presentation to the emergency room, BP was 98/74 with heart rate of 102.  Oral temperature was 99.4 later labs revealed a potassium of 3.1 with albumin of 2.8 alk phos 137 protein of 7.5.  CBC showed leukocytosis 12.5 with neutrophilia and anemia close to baseline with thrombocytosis close to previous levels.  Procalcitonin came back at 13.  Blood cultures were added. EKG as reviewed by me : showed sinus tachycardia with a rate of 116 with right axis deviation and nonspecific intraventricular conduction delay. Imaging: Two-view  chest x-ray showed no acute cardiopulmonary disease.  Soft tissue neck CT showed the following: 1. Area of abnormal contrast enhancement at left level 2A, likely nodal conglomerate with areas of necrosis. There is likely extranodal extension with a cutaneous lesion of the retroauricular soft tissue. 2. Diffuse edema of the soft tissues of the oropharynx, hypopharynx and larynx, likely sequela of radiation therapy. 3. 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  Noncontrasted CT scan and C-spine CT showed the following: 1.  No acute intracranial process. 2.  No acute fracture or traumatic listhesis in the cervical spine. 3. Evaluation of the neck soft tissues is limited by the absence of intravenous contrast. Within this limitation, there are changes in the left neck that are likely related to the patient's throat cancer.  Brain MRI showed no evidence for metastasis or acute abnormality.  The patient was given IV vancomycin and cefepime, 650 mg p.o. Tylenol, fentanyl 50 mcg, MS Contin and OxyContin, 40 mill [place]abbell p.o. potassium chloride and 1 L bolus of IV normal saline.  She will be admitted to a medical telemetry bed for further evaluation and management. PAST MEDICAL HISTORY:   Past Medical History:  Diagnosis Date   Bipolar 1 disorder ([place])    Seizures ([place])     PAST SURGICAL HISTORY:   Past Surgical History:  Procedure Laterality Date   CESAREAN SECTION      SOCIAL HISTORY:   Social History   Tobacco Use  Smoking status: Every Day    Types: Cigarettes   Smokeless tobacco: Never  Substance Use Topics   Alcohol use: Yes    FAMILY HISTORY:   Family History  Family history unknown: Yes  No pertinent familial diseases.  DRUG ALLERGIES:  No Known Allergies  REVIEW OF SYSTEMS:   ROS As per history of present illness. All pertinent systems were reviewed above. Constitutional, HEENT, cardiovascular, respiratory, GI, GU, musculoskeletal, neuro, psychiatric,  endocrine, integumentary and hematologic systems were reviewed and are otherwise negative/unremarkable except for positive findings mentioned above in the HPI.   MEDICATIONS AT HOME:   Prior to Admission medications   Medication Sig Start Date End Date Taking? Authorizing Provider  morphine (MS CONTIN) 15 MG 12 hr tablet Take 15 mg by mouth 3 (three) times daily. 04/25/22  Yes [provider]  Oxycodone HCl 10 MG TABS Take 10 mg by mouth every 8 (eight) hours as needed. 04/13/22  Yes [provider]  QUEtiapine (SEROQUEL) 100 MG tablet Take 100 mg by mouth at bedtime. 02/13/22  Yes [provider]  traZODone (DESYREL) 100 MG tablet Take 100 mg by mouth at bedtime. 02/13/22  Yes [provider]  acetaminophen (TYLENOL) 500 MG tablet Take by mouth. Patient not taking: Reported on 05/16/2022    [provider]  divalproex (DEPAKOTE ER) 500 MG 24 hr tablet Take by mouth. Patient not taking: Reported on 05/16/2022 01/08/20   [provider]  dronabinol (MARINOL) 2.5 MG capsule Take 2.5 mg by mouth 2 (two) times daily. Patient not taking: Reported on 05/16/2022 03/02/22   [provider]  gabapentin (NEURONTIN) 300 MG capsule Take 600 mg by mouth 3 (three) times daily. Patient not taking: Reported on 05/16/2022 04/03/22   [provider]  ibuprofen (ADVIL) 600 MG tablet Take by mouth. Patient not taking: Reported on 05/16/2022 01/31/22 01/31/23  [provider]  loperamide (IMODIUM) 2 MG capsule Take by mouth. Patient not taking: Reported on 05/16/2022 04/25/22 05/25/22  [provider]  mirtazapine (REMERON) 15 MG tablet Take by mouth. Patient not taking: Reported on 05/16/2022 01/08/20   [provider]  Multiple Vitamins-Minerals (THERA-M) TABS Take 1 tablet by mouth daily. Patient not taking: Reported on 05/16/2022 04/25/22 09/02/22  [provider]      VITAL SIGNS:  Blood pressure 98/70, pulse 99,  temperature 99.4 F (37.4 C), temperature source Oral, resp. rate 18, height $RemoveBe'5\' 2"'jGnbOfZWE$  (1.575 m), weight 38.1 kg, SpO2 97 %.  PHYSICAL EXAMINATION:  Physical Exam  GENERAL:  61 y.o.-year-old African-American female patient lying in the bed with no acute distress.  EYES: Pupils equal, round, reactive to light and accommodation. No scleral icterus. Extraocular muscles intact.  HEENT: Head atraumatic, normocephalic. Oropharynx and nasopharynx clear.  NECK:  Supple, no jugular venous distention. No thyroid enlargement, no tenderness.  Left throat and jaw area swelling with left infra-auricular mass. LUNGS: Normal breath sounds bilaterally, no wheezing, rales,rhonchi or crepitation. No use of accessory muscles of respiration.  CARDIOVASCULAR: Regular rate and rhythm, S1, S2 normal. No murmurs, rubs, or gallops.  ABDOMEN: Soft, nondistended, nontender. Bowel sounds present. No organomegaly or mass.  G-tube in place without clear signs of infection surrounding it. EXTREMITIES: No pedal edema, cyanosis, or clubbing.  NEUROLOGIC: Cranial nerves II through XII are intact. Muscle strength 5/5 in all extremities. Sensation intact. Gait not checked.  PSYCHIATRIC: The patient is alert and oriented x 3.  Normal affect and good eye contact. SKIN: No obvious rash, lesion,  or ulcer.   LABORATORY PANEL:   CBC Recent Labs  Lab 05/15/22 1758  WBC 12.5*  HGB 11.1*  HCT 33.2*  PLT 463*   ------------------------------------------------------------------------------------------------------------------  Chemistries  Recent Labs  Lab 05/15/22 1758  NA 135  K 3.1*  CL 99  CO2 25  GLUCOSE 99  BUN 7*  CREATININE 0.71  CALCIUM 8.9  MG 2.1  AST 13*  ALT 7  ALKPHOS 137*  BILITOT 0.3   ------------------------------------------------------------------------------------------------------------------  Cardiac Enzymes No results for input(s): "TROPONINI" in the last 168  hours. ------------------------------------------------------------------------------------------------------------------  RADIOLOGY:  MR Brain W and Wo Contrast  Result Date: 05/16/2022 CLINICAL DATA:  Seizures and balance loss. History of head neck carcinoma. EXAM: MRI HEAD WITHOUT AND WITH CONTRAST TECHNIQUE: Multiplanar, multiecho pulse sequences of the brain and surrounding structures were obtained without and with intravenous contrast. CONTRAST:  49m GADAVIST GADOBUTROL 1 MMOL/ML IV SOLN COMPARISON:  None Available. FINDINGS: Brain: No acute infarct, mass effect or extra-axial collection. No acute or chronic hemorrhage. Normal white matter signal, parenchymal volume and CSF spaces. The midline structures are normal. Vascular: Major flow voids are preserved. Skull and upper cervical spine: Findings within the soft tissues of the left neck are more completely described on the concomitant CT of the neck. Briefly, there is confluent lymphadenopathy with central necrosis and a postauricular skin lesion. Normal calvarium. Sinuses/Orbits:No paranasal sinus fluid levels or advanced mucosal thickening. No mastoid or middle ear effusion. Normal orbits. IMPRESSION: 1. Normal brain.  No intracranial metastatic disease. 2. Findings of disease spread within the soft tissues of the left neck are more completely described on the concomitant CT of the neck. Electronically Signed   By: KUlyses JarredM.D.   On: 05/16/2022 01:04   CT Soft Tissue Neck W Contrast  Result Date: 05/15/2022 CLINICAL DATA:  Soft tissue swelling. History of head neck carcinoma. EXAM: CT NECK WITH CONTRAST TECHNIQUE: Multidetector CT imaging of the neck was performed using the standard protocol following the bolus administration of intravenous contrast. RADIATION DOSE REDUCTION: This exam was performed according to the departmental dose-optimization program which includes automated exposure control, adjustment of the mA and/or kV according to  patient size and/or use of iterative reconstruction technique. CONTRAST:  727mOMNIPAQUE IOHEXOL 300 MG/ML  SOLN COMPARISON:  None Available. FINDINGS: PHARYNX AND LARYNX: Diffuse edema of the soft tissues of the oropharynx, hypopharynx and larynx, including the epiglottis. There is a small retropharyngeal effusion but no retropharyngeal or peritonsillar abscess. SALIVARY GLANDS: Increased salivary gland enhancement is likely a post treatment effect. THYROID: Normal. LYMPH NODES: Left level 2A nodal conglomerate with area of central necrosis that measures approximately 1.4 cm. There is an enhancing 5 mm right level 1A node. VASCULAR: The left internal jugular vein is thrombosed. There is abnormal contrast enhancement surrounding its course in the left neck, just distal to the carotid bifurcation. The other major vessels are patent. There is a right chest wall Port-A-Cath with its tip below the field of view. LIMITED INTRACRANIAL: Normal. VISUALIZED ORBITS: Normal. MASTOIDS AND VISUALIZED PARANASAL SINUSES: No fluid levels or advanced mucosal thickening. No mastoid effusion. SKELETON: No bony spinal canal stenosis. No lytic or blastic lesions. UPPER CHEST: Clear. OTHER: There is a left retro auricular skin lesion measuring 2.5 x 1.3 cm. IMPRESSION: 1. Area of abnormal contrast enhancement at left level 2A, likely nodal conglomerate with areas of necrosis. There is likely extranodal extension with a cutaneous lesion of the retroauricular soft tissue. 2. Diffuse edema of the soft  tissues of the oropharynx, hypopharynx and larynx, likely sequela of radiation therapy. 3. 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 Electronically Signed   By: Ulyses Jarred M.D.   On: 05/15/2022 23:03   DG Chest 2 View  Result Date: 05/15/2022 CLINICAL DATA:  Weakness EXAM: CHEST - 2 VIEW COMPARISON:  Chest radiograph 08/31/2013 FINDINGS: A right chest wall port is in place with tip terminating in the right atrium. The  cardiomediastinal silhouette is normal. There is no focal consolidation or pulmonary edema. There is no pleural effusion or pneumothorax There is no acute osseous abnormality. IMPRESSION: No radiographic evidence of acute cardiopulmonary process. Electronically Signed   By: Valetta Mole M.D.   On: 05/15/2022 18:19   CT Head Wo Contrast  Result Date: 05/15/2022 CLINICAL DATA:  Slurred speech facial droop multiple falls EXAM: CT HEAD WITHOUT CONTRAST CT CERVICAL SPINE WITHOUT CONTRAST TECHNIQUE: Multidetector CT imaging of the head and cervical spine was performed following the standard protocol without intravenous contrast. Multiplanar CT image reconstructions of the cervical spine were also generated. RADIATION DOSE REDUCTION: This exam was performed according to the departmental dose-optimization program which includes automated exposure control, adjustment of the mA and/or kV according to patient size and/or use of iterative reconstruction technique. COMPARISON:  None Available. FINDINGS: CT HEAD FINDINGS Brain: No evidence of acute infarction, hemorrhage, cerebral edema, mass, mass effect, or midline shift. No hydrocephalus or extra-axial fluid collection. Vascular: No hyperdense vessel. Skull: Normal. Negative for fracture or focal lesion. Sinuses/Orbits: Mild mucosal thickening in the maxillary sinuses. The orbits are unremarkable. Other: The mastoid air cells are well aerated. CT CERVICAL SPINE FINDINGS Alignment: Reversal of the normal cervical lordosis. No traumatic listhesis. Skull base and vertebrae: No acute fracture or suspicious osseous lesion. Soft tissues and spinal canal: No prevertebral fluid or swelling. No visible canal hematoma. Disc levels: No high-grade spinal canal stenosis or neural foraminal narrowing. Upper chest: No focal pulmonary opacity or pleural effusion. Other: Evaluation of the neck soft tissues is limited by the absence of intravenous contrast. Within this limitation, abnormal  soft tissue in the left parapharyngeal space, in the region of left level 2A lymph nodes, extending into the left paravertebral musculature, and an exophytic lesion posterior to the left ear are favored to be related to the patient's known throat cancer. IMPRESSION: 1.  No acute intracranial process. 2.  No acute fracture or traumatic listhesis in the cervical spine. 3. Evaluation of the neck soft tissues is limited by the absence of intravenous contrast. Within this limitation, there are changes in the left neck that are likely related to the patient's throat cancer. Electronically Signed   By: Merilyn Baba M.D.   On: 05/15/2022 18:13   CT Cervical Spine Wo Contrast  Result Date: 05/15/2022 CLINICAL DATA:  Slurred speech facial droop multiple falls EXAM: CT HEAD WITHOUT CONTRAST CT CERVICAL SPINE WITHOUT CONTRAST TECHNIQUE: Multidetector CT imaging of the head and cervical spine was performed following the standard protocol without intravenous contrast. Multiplanar CT image reconstructions of the cervical spine were also generated. RADIATION DOSE REDUCTION: This exam was performed according to the departmental dose-optimization program which includes automated exposure control, adjustment of the mA and/or kV according to patient size and/or use of iterative reconstruction technique. COMPARISON:  None Available. FINDINGS: CT HEAD FINDINGS Brain: No evidence of acute infarction, hemorrhage, cerebral edema, mass, mass effect, or midline shift. No hydrocephalus or extra-axial fluid collection. Vascular: No hyperdense vessel. Skull: Normal. Negative for fracture or focal lesion.  Sinuses/Orbits: Mild mucosal thickening in the maxillary sinuses. The orbits are unremarkable. Other: The mastoid air cells are well aerated. CT CERVICAL SPINE FINDINGS Alignment: Reversal of the normal cervical lordosis. No traumatic listhesis. Skull base and vertebrae: No acute fracture or suspicious osseous lesion. Soft tissues and  spinal canal: No prevertebral fluid or swelling. No visible canal hematoma. Disc levels: No high-grade spinal canal stenosis or neural foraminal narrowing. Upper chest: No focal pulmonary opacity or pleural effusion. Other: Evaluation of the neck soft tissues is limited by the absence of intravenous contrast. Within this limitation, abnormal soft tissue in the left parapharyngeal space, in the region of left level 2A lymph nodes, extending into the left paravertebral musculature, and an exophytic lesion posterior to the left ear are favored to be related to the patient's known throat cancer. IMPRESSION: 1.  No acute intracranial process. 2.  No acute fracture or traumatic listhesis in the cervical spine. 3. Evaluation of the neck soft tissues is limited by the absence of intravenous contrast. Within this limitation, there are changes in the left neck that are likely related to the patient's throat cancer. Electronically Signed   By: Merilyn Baba M.D.   On: 05/15/2022 18:13      IMPRESSION AND PLAN:  Assessment and Plan: * Sepsis ([place]) - This is sepsis of undetermined organism and questionable source.  This is manifested by leukocytosis of 12.5 with neutrophilia, tachycardia and tachypnea.  She also has hypotension and can qualify her for severe sepsis. - The patient will be admitted to a medical telemetry bed. - We will continue broad-spectrum antibiotics with IV vancomycin, cefepime and Flagyl. - We will follow blood cultures. - Her neck nodal conglomerate with necrosis could be the source.  Seizure disorder ([place]) - We will continue Neurontin and Depakote ER.  Throat cancer [facility]) - This is a stage IV throat cancer. - Pain management will be provided. - She may need palliative care consult at some point.  Hypokalemia Potassium will be replaced.  Bipolar 1 disorder ([place]) - We will continue her Seroquel and Depakote ER.  We will resume her mirtazapine and Cymbalta as well.  Tobacco abuse -  She will be counseled for smoking cessation.   DVT prophylaxis: Lovenox.  Advanced Care Planning:  Code Status: full code.  This was discussed with her. Family Communication:  The plan of care was discussed in details with the patient (and family). I answered all questions. The patient agreed to proceed with the above mentioned plan. Further management will depend upon hospital course. Disposition Plan: Back to previous home environment Consults called: none.  All the records are reviewed and case discussed with ED provider.  Status is: Inpatient   At the time of the admission, it appears that the appropriate admission status for this patient is inpatient.  This is judged to be reasonable and necessary in order to provide the required intensity of service to ensure the patient's safety given the presenting symptoms, physical exam findings and initial radiographic and laboratory data in the context of comorbid conditions.  The patient requires inpatient status due to high intensity of service, high risk of further deterioration and high frequency of surveillance required.  I certify that at the time of admission, it is my clinical judgment that the patient will require inpatient hospital care extending more than 2 midnights.  Dispo: The patient is from: Home              Anticipated d/c is to: Home              Patient currently is not medically stable to d/c.              Difficult to place patient: No  Christel Mormon M.D on 05/16/2022 at 4:25 AM  [facility]   From 7 PM-7 AM, contact night-coverage [email or web address]  CC: Primary care physician; Neomia Dear, MD
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
## 2022-05-16 NOTE — Progress Notes (Addendum)
PHARMACY - PHYSICIAN COMMUNICATION CRITICAL VALUE ALERT - BLOOD CULTURE IDENTIFICATION (BCID)  Christina Porter is an 61 y.o. female who presented to [facility] on [facility]/15/2022 with a chief complaint of fall  Assessment:  1/4 anaerobic bottle growing GPC staph species no res gene detected (include suspected source if known)  Name of physician (or Provider) Contacted: Dr Ara Kussmaul  Current antibiotics: Unasyn IV  Changes to prescribed antibiotics recommended:  Patient is on recommended antibiotics - No changes needed  Results for orders placed or performed during the hospital encounter of 05/15/22  Blood Culture ID Panel (Reflexed) (Collected: 05/15/2022 11:48 PM)  Result Value Ref Range   Enterococcus faecalis NOT DETECTED NOT DETECTED   Enterococcus Faecium NOT DETECTED NOT DETECTED   Listeria monocytogenes NOT DETECTED NOT DETECTED   Staphylococcus species DETECTED (A) NOT DETECTED   Staphylococcus aureus (BCID) NOT DETECTED NOT DETECTED   Staphylococcus epidermidis NOT DETECTED NOT DETECTED   Staphylococcus lugdunensis NOT DETECTED NOT DETECTED   Streptococcus species NOT DETECTED NOT DETECTED   Streptococcus agalactiae NOT DETECTED NOT DETECTED   Streptococcus pneumoniae NOT DETECTED NOT DETECTED   Streptococcus pyogenes NOT DETECTED NOT DETECTED   A.calcoaceticus-baumannii NOT DETECTED NOT DETECTED   Bacteroides fragilis NOT DETECTED NOT DETECTED   Enterobacterales NOT DETECTED NOT DETECTED   Enterobacter cloacae complex NOT DETECTED NOT DETECTED   Escherichia coli NOT DETECTED NOT DETECTED   Klebsiella aerogenes NOT DETECTED NOT DETECTED   Klebsiella oxytoca NOT DETECTED NOT DETECTED   Klebsiella pneumoniae NOT DETECTED NOT DETECTED   Proteus species NOT DETECTED NOT DETECTED   Salmonella species NOT DETECTED NOT DETECTED   Serratia marcescens NOT DETECTED NOT DETECTED   Haemophilus influenzae NOT DETECTED NOT DETECTED   Neisseria meningitidis NOT DETECTED NOT  DETECTED   Pseudomonas aeruginosa NOT DETECTED NOT DETECTED   Stenotrophomonas maltophilia NOT DETECTED NOT DETECTED   Candida albicans NOT DETECTED NOT DETECTED   Candida auris NOT DETECTED NOT DETECTED   Candida glabrata NOT DETECTED NOT DETECTED   Candida krusei NOT DETECTED NOT DETECTED   Candida parapsilosis NOT DETECTED NOT DETECTED   Candida tropicalis NOT DETECTED NOT DETECTED   Cryptococcus neoformans/gattii NOT DETECTED NOT DETECTED    Darrick Penna 05/16/2022  7:16 PM

## 2022-05-16 NOTE — Progress Notes (Addendum)
Initial Nutrition Assessment  DOCUMENTATION CODES:   Severe malnutrition in context of chronic illness, Underweight  INTERVENTION:   -MVI with minerals daily -Advance diet to full liquid diet to better mimic home regimen; case discussed with MD and received permission to advance diet -Initiate bolus TF per home regimen:   237 ml (1 ARC carton) Osmolite 1.5 4 times daily via g-tube  30 ml Prosource TF BID.    75 ml free water flush before and after each feeding administration  Tube feeding regimen provides 1500 kcal (100% of needs), 82 grams of protein, and 724 ml of H2O.  Total free water: 1324 ml daily  -Monitor Mg, K, and Phos daily and replete as needed secondary to high refeeding risk  NUTRITION DIAGNOSIS:   Severe Malnutrition related to chronic illness (stage IV throat cancer) as evidenced by moderate fat depletion, severe fat depletion, moderate muscle depletion, severe muscle depletion.  GOAL:   Patient will meet greater than or equal to 90% of their needs  MONITOR:   PO intake, TF tolerance  REASON FOR ASSESSMENT:   Consult Enteral/tube feeding initiation and management  ASSESSMENT:   Pt with medical history significant for bipolar 1 disorder, seizures and metastatic throat cancer which has been managed at [facility] in 2022 and recently had a relapse last month.  Pt admitted with sepsis.   Reviewed I/O's: -350 ml x 24 hours  UOP: 350 ml x 24 hours  Pt with stage IV throat cancer and is followed by [facility]. Reviewed outpatient RD notes from earlier this year. Pt administers 1 carton of Osmolite 1.5 via PEG 4 times per day (1200. 1600, 2000, 0000). Pt refused continuous feedings at last outpatient RD visit and reports preference for bolus feedings.   Spoke with pt at bedside, who kept her eyes closed throughout visit, but answered RD questions. Pt reports she takes very little by mouth due to pain, but will eat broth, juices, and a few bites of pudding. She also takes  a MVI PO per her report.   Pt confirms use of feeding tube at home. She states "I'm not as good about using it at home and I need to be better. That's my goal for when I go home". Pt shares that she typically administers 2-4 cartons of Osmolite 1.5 via g-tube daily. Typical feeding schedule is 0800, 1200, 1600, and 2000, so she has time to watch her TV shows at night. Pt amenable to re-start home TF regimen. RD discussed importance of adequate nutrition and administering TF as prescribed to help meet nutritional goals.   Per pt, her UBW is around 125#, but estimates she has lost about 55# over the past year and half gradually. Noted wt of 92# on 04/19/22. Pt has experienced a 9.1% wt loss over the past month, which is significant for time frame.   Pt currently on clear liquid diet. Case discussed with MD; RD received permission to advance diet to full liquids to better mimic home regimen.   Medications reviewed and include morphine.   Labs reviewed: Na: 134, K: 3.4.    NUTRITION - FOCUSED PHYSICAL EXAM:  Flowsheet Row Most Recent Value  Orbital Region Mild depletion  Upper Arm Region Severe depletion  Thoracic and Lumbar Region Severe depletion  Buccal Region Moderate depletion  Temple Region Mild depletion  Clavicle Bone Region Severe depletion  Clavicle and Acromion Bone Region Severe depletion  Scapular Bone Region Severe depletion  Dorsal Hand Moderate depletion  Patellar Region Severe depletion  Anterior Thigh Region Severe depletion  Posterior Calf Region Severe depletion  Edema (RD Assessment) None  Hair Reviewed  Eyes Reviewed  Mouth Reviewed  Skin Reviewed  Nails Reviewed       Diet Order:   Diet Order             Diet full liquid Room service appropriate? Yes; Fluid consistency: Thin  Diet effective now                   EDUCATION NEEDS:   Education needs have been addressed  Skin:  Skin Assessment: Reviewed RN Assessment  Last BM:  05/14/22  Height:    Ht Readings from Last 1 Encounters:  05/15/22 '5\' 2"'$  (1.575 m)    Weight:   Wt Readings from Last 1 Encounters:  05/15/22 38.1 kg    Ideal Body Weight:  50 kg  BMI:  Body mass index is 15.36 kg/m.  Estimated Nutritional Needs:   Kcal:  1500-1700  Protein:  80-95 grams  Fluid:  > 1.5 L    Loistine Chance, RD, LDN, [place] Registered Dietitian II Certified Diabetes Care and Education Specialist Please refer to [facility] for RD and/or RD on-call/weekend/after hours pager

## 2022-05-16 NOTE — Hospital Course (Addendum)
[name] is a 61 yo AA female with PMH soft palate SCC (follows with Dr. Harriette Ohara, [facility] bipolar disorder, tobacco use, chronic pain who presented with hypotension, uncontrolled pain (out of pain meds as least 2-4 days), and recent falls. She underwent extensive work-up on admission due to concern for sepsis. She was afebrile, noted to be tachycardic, and hypotensive (72/58). CT soft tissue neck noted nodal conglomerate mass with area of necrosis (seen on prior [name] imaging too). She was started on antibiotics and admitted for further work-up and monitoring.   Blood cultures obtained on admission remained negative at time of discharge.  Of note, did grow Staph hominis in 1 bottle which was considered contaminant per ID as well. Procalcitonin was elevated on admission, 13.13 and after initiation of antibiotics showed good response with trending and had down trended to 1.7 prior to discharge. She was maintained on Unasyn during hospitalization and discharged with Augmentin to complete a total of 2-week course.  She has close followup with oncology on 7/25 as well. Furthermore, her main complaint on admission was uncontrolled pain in her neck.  Due to being out of pain medication, she required increased doses initially to achieve adequate pain control.  Case was discussed with Dr. Venora Maples as well. At discharge she is provided a bridging prescription of MS Contin and Oxy IR.  2-week course was provided to allow adequate bridging and follow-up for her to obtain new prescriptions/refills on these chronic pain medications.  Dosages and prescriptions were verified on the database.

## 2022-05-16 NOTE — ED Notes (Signed)
Pt took her trazodone and Seroquel PO. Pt choked on trazodone and coughed the pill back up. Pt put pill in med cup and wanted to dissolve pill. This nurse said she would reach out to provider about the fact she had the issue with the medication.

## 2022-05-16 NOTE — Progress Notes (Signed)
Progress Note    Christina Porter   LGX:[id number]  DOB: August 26, 1961  DOA: 05/15/2022     0 PCP: Neomia Dear, MD  Initial CC: Uncontrolled pain, hypotension, falling  Hospital Course: Christina Porter is a 61 yo AA female with PMH soft palate SCC (follows with Dr. Harriette Ohara, [facility]), bipolar disorder, tobacco use, chronic pain who presented with hypotension, uncontrolled pain (out of pain meds x 2 days), and recent falls. She underwent extensive work-up on admission due to concern for sepsis. She was afebrile, noted to be tachycardic, and hypotensive (72/58). CT soft tissue neck noted nodal conglomerate mass with area of necrosis. She was started on antibiotics and admitted for further work-up and monitoring.   Interval History:  Seen in the ER this morning.  Pain was her biggest complaint.  She again brought up having "seizures" and asked for medication for this.  After reviewing her chart, she is not treated for a true seizure disorder and it appears that the pains she describes makes her shake and jolt at times which she is interpreting as seizure.   Assessment and Plan: * Sepsis ([place]) - Leukocytosis, tachycardia, tachypnea, suspected source of necrotic neck mass which is known - Initially started on vancomycin, cefepime, Flagyl and transitioned to Unasyn after ID evaluation -Continue following cultures -Follow-up cortisol - Trend procalcitonin -Hypotension may also be due to abuse of opioids which may also be contributing to her falls  Internal jugular vein thrombosis, left ([place]) - Left IJ thrombosis noted on CT on admission.  In setting of underlying neck cancer, was some concern for septic thrombophlebitis.  She is however asymptomatic with no complaints of distant organ involvement and has no high-grade fever or significant leukocytosis. -Have asked for ID evaluation for further opinion as well.  Currently low suspicion also -Antibiotics have been transitioned to Unasyn and we  will continue to follow cultures -Last CT neck noted in care everywhere from 04/18/2022: Left IJ not visualized "similar to prior, likely reflecting chronic occlusion or compression".  Seizure disorder [facility]) - Prior notes reviewed in epic, notably June 2023 admission at [facility] when she was also evaluated by neurology.  She has mention of seizure disorder history but notes indicate not on medications. Notes indicate patient complains of "seziures" but with further investigation it seems to be "sharp electrocuting pain" and no loss of consciousness or postictal periods -Depakote noted to be on med rec but not taking per reconciliation  Throat cancer ([place]) - soft palate SCC; left neck FNA positive for malignancy consistent with metastatic SCC.  PET scan 12/15/2020 also notable for local metastatic disease - follows with Dr. Harriette Ohara at [facility] - s/p chemo (cisplatin) - last heme/onc OV 02/15/21 -Recently contacted by radiation oncology (Dr. Crisoforo Oxford) 05/12/2022.  They are trying to reschedule study injection and simulation appointments; she has missed prior appointments but endorses wanting to resume radiation - will discuss case with Dr. Harriette Ohara regarding rec's while patient is hospitalized -One of her biggest complaints is uncontrolled pain and apparently was out of pain medication for about 2 days prior to admission.  Concern for possibly taking more than prescribed given running out; database reviewed, filled MS Contin 15 mg on 04/25/2022 (quantity 90 for 30-day supply).  Filled oxy IR 5 mg on 04/25/2022 (quantity 180, 30-day supply) -Theoretically her supply should still go for another approximately 7 to 10 days - For now, continue on morphine IV for breakthrough pain control and oxycodone (higher than home dosing) - resuming  MS contin as well   Contamination of blood culture - 1/4 bottles from admission blood cultures growing Staph (species pending); for now is presumed contaminate (noted from port a  cath)  Hypokalemia - replete as needed  Bipolar 1 disorder ([place]) - continue Seroquel and Depakote ER.  We will resume her mirtazapine and Cymbalta as well.  Protein-calorie malnutrition, severe - RD consulted - continue FLD and resume TF  Tobacco abuse - She will be counseled for smoking cessation.   Old records reviewed in assessment of this patient  Antimicrobials: Cefepime 05/16/22 x 1 Vanc 05/16/22 x 2 doses Zosyn 05/16/22 x 1 Unasyn 05/16/22 >> current  DVT prophylaxis:  enoxaparin (LOVENOX) injection 30 mg Start: 05/16/22 0300   Code Status:   Code Status: Full Code  Mobility Assessment (last 72 hours)     Mobility Assessment     Row Name 05/16/22 1328 05/16/22 0759         Does patient have an order for bedrest or is patient medically unstable No - Continue assessment No - Continue assessment      What is the highest level of mobility based on the progressive mobility assessment? Level 5 (Walks with assist in room/hall) - Balance while stepping forward/back and can walk in room with assist - Complete --               Disposition Plan:  Home in 2-3 days Status is: Inpt  Objective: Blood pressure 108/81, pulse 99, temperature 98.1 F (36.7 C), resp. rate 18, height '5\' 2"'$  (1.575 m), weight 38.1 kg, SpO2 98 %.  Examination:  Physical Exam Constitutional:      General: She is not in acute distress.    Appearance: Normal appearance.  HENT:     Head: Normocephalic and atraumatic.     Mouth/Throat:     Mouth: Mucous membranes are dry.  Eyes:     Extraocular Movements: Extraocular movements intact.  Neck:     Comments: ~4 cm indurated left sided neck mass noted which is TTP Cardiovascular:     Rate and Rhythm: Normal rate and regular rhythm.  Pulmonary:     Effort: Pulmonary effort is normal.     Breath sounds: Normal breath sounds.  Abdominal:     General: Bowel sounds are normal. There is no distension.     Palpations: Abdomen is soft.      Tenderness: There is no abdominal tenderness.  Musculoskeletal:        General: Normal range of motion.  Skin:    General: Skin is warm and dry.  Neurological:     General: No focal deficit present.     Mental Status: She is alert.  Psychiatric:        Mood and Affect: Mood normal.      Consultants:  ID  Procedures:    Data Reviewed: Results for orders placed or performed during the hospital encounter of 05/15/22 (from the past 24 hour(s))  Blood culture (routine x 2)     Status: None (Preliminary result)   Collection Time: 05/15/22 11:48 PM   Specimen: BLOOD  Result Value Ref Range   Specimen Description BLOOD PORTA CATH    Special Requests      BOTTLES DRAWN AEROBIC AND ANAEROBIC Blood Culture results may not be optimal due to an inadequate volume of blood received in culture bottles   Culture  Setup Time      Organism ID to follow ANAEROBIC BOTTLE ONLY [place]  CRITICAL RESULT CALLED TO, READ BACK BY AND VERIFIED WITH: Nilsa Nutting 05/16/22 1708 KLW Performed at [facility], [address] [place], [place] [address]    Culture GRAM POSITIVE COCCI    Report Status PENDING   Urinalysis, Routine w reflex microscopic     Status: Abnormal   Collection Time: 05/15/22 11:48 PM  Result Value Ref Range   Color, Urine YELLOW (A) YELLOW   APPearance CLEAR (A) CLEAR   Specific Gravity, Urine 1.028 1.005 - 1.030   pH 6.0 5.0 - 8.0   Glucose, UA NEGATIVE NEGATIVE mg/dL   Hgb urine dipstick NEGATIVE NEGATIVE   Bilirubin Urine NEGATIVE NEGATIVE   Ketones, ur NEGATIVE NEGATIVE mg/dL   Protein, ur NEGATIVE NEGATIVE mg/dL   Nitrite NEGATIVE NEGATIVE   Leukocytes,Ua SMALL (A) NEGATIVE   RBC / HPF 0-5 0 - 5 RBC/hpf   WBC, UA 0-5 0 - 5 WBC/hpf   Bacteria, UA NONE SEEN NONE SEEN   Squamous Epithelial / LPF 0-5 0 - 5   Mucus PRESENT    Hyaline Casts, UA PRESENT   Blood Culture ID Panel (Reflexed)     Status: Abnormal   Collection Time: 05/15/22 11:48 PM   Result Value Ref Range   Enterococcus faecalis NOT DETECTED NOT DETECTED   Enterococcus Faecium NOT DETECTED NOT DETECTED   Listeria monocytogenes NOT DETECTED NOT DETECTED   Staphylococcus species DETECTED (A) NOT DETECTED   Staphylococcus aureus (BCID) NOT DETECTED NOT DETECTED   Staphylococcus epidermidis NOT DETECTED NOT DETECTED   Staphylococcus lugdunensis NOT DETECTED NOT DETECTED   Streptococcus species NOT DETECTED NOT DETECTED   Streptococcus agalactiae NOT DETECTED NOT DETECTED   Streptococcus pneumoniae NOT DETECTED NOT DETECTED   Streptococcus pyogenes NOT DETECTED NOT DETECTED   A.calcoaceticus-baumannii NOT DETECTED NOT DETECTED   Bacteroides fragilis NOT DETECTED NOT DETECTED   Enterobacterales NOT DETECTED NOT DETECTED   Enterobacter cloacae complex NOT DETECTED NOT DETECTED   Escherichia coli NOT DETECTED NOT DETECTED   Klebsiella aerogenes NOT DETECTED NOT DETECTED   Klebsiella oxytoca NOT DETECTED NOT DETECTED   Klebsiella pneumoniae NOT DETECTED NOT DETECTED   Proteus species NOT DETECTED NOT DETECTED   Salmonella species NOT DETECTED NOT DETECTED   Serratia marcescens NOT DETECTED NOT DETECTED   Haemophilus influenzae NOT DETECTED NOT DETECTED   Neisseria meningitidis NOT DETECTED NOT DETECTED   Pseudomonas aeruginosa NOT DETECTED NOT DETECTED   Stenotrophomonas maltophilia NOT DETECTED NOT DETECTED   Candida albicans NOT DETECTED NOT DETECTED   Candida auris NOT DETECTED NOT DETECTED   Candida glabrata NOT DETECTED NOT DETECTED   Candida krusei NOT DETECTED NOT DETECTED   Candida parapsilosis NOT DETECTED NOT DETECTED   Candida tropicalis NOT DETECTED NOT DETECTED   Cryptococcus neoformans/gattii NOT DETECTED NOT DETECTED  SARS Coronavirus 2 by RT PCR (hospital order, performed in [place] hospital lab) *cepheid single result test* Anterior Nasal Swab     Status: None   Collection Time: 05/16/22  1:10 AM   Specimen: Anterior Nasal Swab  Result Value  Ref Range   SARS Coronavirus 2 by RT PCR NEGATIVE NEGATIVE  HIV Antibody (routine testing w rflx)     Status: None   Collection Time: 05/16/22  5:19 AM  Result Value Ref Range   HIV Screen 4th Generation wRfx Non Reactive Non Reactive  Protime-INR     Status: None   Collection Time: 05/16/22  5:19 AM  Result Value Ref Range   Prothrombin Time 14.2 11.4 -  15.2 seconds   INR 1.1 0.8 - 1.2  Procalcitonin     Status: None   Collection Time: 05/16/22  5:19 AM  Result Value Ref Range   Procalcitonin 7.16 ng/mL  Basic metabolic panel     Status: Abnormal   Collection Time: 05/16/22  5:19 AM  Result Value Ref Range   Sodium 134 (L) 135 - 145 mmol/L   Potassium 3.4 (L) 3.5 - 5.1 mmol/L   Chloride 105 98 - 111 mmol/L   CO2 26 22 - 32 mmol/L   Glucose, Bld 125 (H) 70 - 99 mg/dL   BUN 6 (L) 8 - 23 mg/dL   Creatinine, Ser 0.50 0.44 - 1.00 mg/dL   Calcium 7.8 (L) 8.9 - 10.3 mg/dL   GFR, Estimated >60 >60 mL/min   Anion gap 3 (L) 5 - 15  CBC     Status: Abnormal   Collection Time: 05/16/22  5:19 AM  Result Value Ref Range   WBC 10.8 (H) 4.0 - 10.5 K/uL   RBC 2.87 (L) 3.87 - 5.11 MIL/uL   Hemoglobin 9.5 (L) 12.0 - 15.0 g/dL   HCT 28.5 (L) 36.0 - 46.0 %   MCV 99.3 80.0 - 100.0 fL   MCH 33.1 26.0 - 34.0 pg   MCHC 33.3 30.0 - 36.0 g/dL   RDW 13.8 11.5 - 15.5 %   Platelets 380 150 - 400 K/uL   nRBC 0.0 0.0 - 0.2 %  Glucose, capillary     Status: None   Collection Time: 05/16/22  5:05 PM  Result Value Ref Range   Glucose-Capillary 99 70 - 99 mg/dL    I have Reviewed nursing notes, Vitals, and Lab results since pt's last encounter. Pertinent lab results : see above I have ordered test including BMP, CBC, Mg I have reviewed the last note from staff over past 24 hours I have discussed pt's care plan and test results with nursing staff, case manager   LOS: 0 days   Dwyane Dee, MD [facility] 05/16/2022, 7:21 PM

## 2022-05-16 NOTE — Consult Note (Addendum)
NAME: Christina Porter  DOB: Jun 12, 1961  MRN: [id number]  Date/Time: 05/16/2022 2:38 PM  REQUESTING PROVIDER: Dr.Girguis Subjective:  REASON FOR CONSULT: septic thrombophlebitis ?Chart reviewed completely- spoke to patient and her son Christina Porter. Demetric Porter is a 61 y.o. 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 The tumor was abutting on the left carotid and left IJ was obstructed Case was reviewed at Tumor Board and no surgical intervention was recommended because of extensive disease and great vessel involvement. Pt was asked to follow up with primary oncologist for clinical trial ( SBRT+nanoparticle injection on NBTX 1100 study Pt was discharged home and has visiting nurse three times a week- yesterday they checked her BP and it was low and EMS was called and she was sent to ED Pt also c/o pain not being well controlled She says she has been falling and thinks it is her grand mal seizures She deneis any fever  Feels very weak Severe pain Has lost weight Son who came later said nobody has talked to him and also nobody called from [facility] regarding follow up appts In the ED  Initial BP 72/58, Pulse 116, temp 98,7 and sats 98% WBC 12.5, HB 11, pLT 463, cr 0.71 K 3.1, Cr 0.71, alb 2.8, AST 13 CT head without contrast no acute process CT soft tissue neck showed nodal conglomerate with necrosis at left level 2 A. Extranodal extension with cutaneous lesion Diffuse edema of the soft tissue of oropharynx, hypopharynx and larynx likely sequela to radiation therapy and thrombosis of left IJ vein. No lactate was done- procal was 13 and she was started on vanco/cefepime I am asked to see her for septic thrombophlebitis.   Past Medical History:  Diagnosis Date   Bipolar 1  disorder ([place])    Seizures ([place])    Smoker Alcohol use  Past Surgical History:  Procedure Laterality Date   CESAREAN SECTION      Social History   Socioeconomic History   Marital status: Married    Spouse name: Not on file   Number of children: Not on file   Years of education: Not on file   Highest education level: Not on file  Occupational History   Not on file  Tobacco Use   Smoking status: Every Day    Types: Cigarettes   Smokeless tobacco: Never  Vaping Use   Vaping Use: Never used  Substance and Sexual Activity   Alcohol use: Yes   Drug use: Never   Sexual activity: Not on file  Other Topics Concern   Not on file  Social History Narrative   ** Merged History Encounter **       Social Determinants of Health   Financial Resource Strain: Not on file  Food Insecurity: Not on file  Transportation Needs: Not on file  Physical Activity: Not on file  Stress: Not on file  Social Connections: Not on file  Intimate Partner Violence: Not on file    Family History  Family history unknown: Yes   No Known Allergies I? Current Facility-Administered Medications  Medication Dose Route Frequency Provider Last Rate Last Admin   acetaminophen (TYLENOL) tablet 650 mg  650 mg Oral Q6H PRN Mansy, Arvella Merles, MD  Or   acetaminophen (TYLENOL) suppository 650 mg  650 mg Rectal Q6H PRN Mansy, Jan A, MD       enoxaparin (LOVENOX) injection 30 mg  30 mg Subcutaneous QHS Mansy, Jan A, MD   30 mg at 05/16/22 0305   fluticasone (FLONASE) 50 MCG/ACT nasal spray 2 spray  2 spray Each Nare Daily Mansy, Jan A, MD       guaiFENesin (MUCINEX) 12 hr tablet 600 mg  600 mg Oral BID Mansy, Jan A, MD   600 mg at 05/16/22 0855   magnesium hydroxide (MILK OF MAGNESIA) suspension 30 mL  30 mL Oral Daily PRN Mansy, Jan A, MD       morphine (MS CONTIN) 12 hr tablet 15 mg  15 mg Oral TID Mansy, Jan A, MD   15 mg at 05/16/22 0855   ondansetron (ZOFRAN) tablet 4 mg  4 mg Oral Q6H PRN Mansy, Jan A, MD        Or   ondansetron [facility]) injection 4 mg  4 mg Intravenous Q6H PRN Mansy, Jan A, MD       oxyCODONE (Oxy IR/ROXICODONE) immediate release tablet 10 mg  10 mg Oral Q8H PRN Mansy, Jan A, MD   10 mg at 05/16/22 1311   piperacillin-tazobactam (ZOSYN) IVPB 3.375 g  3.375 g Intravenous Lenise Arena, MD 12.5 mL/hr at 05/16/22 1425 3.375 g at 05/16/22 1425   QUEtiapine (SEROQUEL) tablet 100 mg  100 mg Oral QHS Mansy, Jan A, MD   100 mg at 05/16/22 0302   traZODone (DESYREL) tablet 100 mg  100 mg Oral QHS Mansy, Jan A, MD   100 mg at 05/16/22 0302   traZODone (DESYREL) tablet 25 mg  25 mg Oral QHS PRN Mansy, Arvella Merles, MD         Abtx:  Anti-infectives (From admission, onward)    Start     Dose/Rate Route Frequency Ordered Stop   05/17/22 0300  vancomycin (VANCOREADY) IVPB 500 mg/100 mL  Status:  Discontinued        500 mg 100 mL/hr over 60 Minutes Intravenous Every 24 hours 05/16/22 0215 05/16/22 1035   05/16/22 1300  ceFEPIme (MAXIPIME) 2 g in sodium chloride 0.9 % 100 mL IVPB  Status:  Discontinued        2 g 200 mL/hr over 30 Minutes Intravenous Every 12 hours 05/16/22 0202 05/16/22 1035   05/16/22 1200  piperacillin-tazobactam (ZOSYN) IVPB 3.375 g        3.375 g 12.5 mL/hr over 240 Minutes Intravenous Every 8 hours 05/16/22 1035     05/16/22 0300  metroNIDAZOLE (FLAGYL) IVPB 500 mg  Status:  Discontinued        500 mg 100 mL/hr over 60 Minutes Intravenous Every 12 hours 05/16/22 0202 05/16/22 1035   05/16/22 0215  vancomycin (VANCOCIN) IVPB 1000 mg/200 mL premix  Status:  Discontinued        1,000 mg 200 mL/hr over 60 Minutes Intravenous  Once 05/16/22 0202 05/16/22 0206   05/16/22 0015  ceFEPIme (MAXIPIME) 2 g in sodium chloride 0.9 % 100 mL IVPB        2 g 200 mL/hr over 30 Minutes Intravenous  Once 05/16/22 0010 05/16/22 0212   05/16/22 0015  vancomycin (VANCOCIN) IVPB 1000 mg/200 mL premix        1,000 mg 200 mL/hr over 60 Minutes Intravenous  Once 05/16/22 0010 05/16/22 0516        REVIEW OF SYSTEMS:  Const:  negative fever, negative chills, +weight loss Eyes: negative diplopia or visual changes, negative eye pain ENT: difficulty swallowing Swelling left side of face Left facial palsy Pain left side of the neck and face Resp:  cough,no dyspnea Cards: negative for chest pain, palpitations, lower extremity edema GU: negative for frequency, dysuria and hematuria GI: Negative for abdominal pain, diarrhea, bleeding, constipation Skin: negative for rash and pruritus Heme: negative for easy bruising and gum/nose bleeding MS: general weakness Neurolo:says falls, seizures Psych:  anxiety, depression  Endocrine: negative for thyroid, diabetes Allergy/Immunology- negative for any medication or food allergies ? Objective:  VITALS:  BP 118/80   Pulse 95   Temp 98.6 F (37 C)   Resp 19   Ht '5\' 2"'$  (1.575 m)   Wt 38.1 kg   SpO2 100%   BMI 15.36 kg/m  LDA port PHYSICAL EXAM:  General:  Awake Alert, frustrated and irritable no distress at rest,  Head: Normocephalic, without obvious abnormality, atraumatic. Eyes: no proptosis ENT unable to open the mouth adequately to examine the throat and moth Dentition poor Neck: radiation changes left side of neck with thickened discolored skin Fungating mass upper part of neck- no discharge   Back: did not  examine Lungs: b/l air entry PORT  Heart: s1s2 Abdomen: Soft, PEG in place Extremities: atraumatic, no cyanosis. No edema. No clubbing Skin: No rashes or lesions. Or bruising Lymph: Cervical, supraclavicular normal. Neurologic: left facial palsy Grossly non-focal Pertinent Labs Lab Results CBC    Component Value Date/Time   WBC 10.8 (H) 05/16/2022 0519   RBC 2.87 (L) 05/16/2022 0519   HGB 9.5 (L) 05/16/2022 0519   HGB 11.9 (L) 08/31/2013 0436   HCT 28.5 (L) 05/16/2022 0519   HCT 36.8 08/31/2013 0436   PLT 380 05/16/2022 0519   PLT 467 (H) 08/31/2013 0436   MCV 99.3 05/16/2022 0519   MCV 81  08/31/2013 0436   MCH 33.1 05/16/2022 0519   MCHC 33.3 05/16/2022 0519   RDW 13.8 05/16/2022 0519   RDW 19.9 (H) 08/31/2013 0436   LYMPHSABS 1.0 05/15/2022 1758   MONOABS 1.3 (H) 05/15/2022 1758   EOSABS 0.1 05/15/2022 1758   BASOSABS 0.1 05/15/2022 1758       Latest Ref Rng & Units 05/16/2022    5:19 AM 05/15/2022    5:58 PM 08/31/2013    4:36 AM  CMP  Glucose 70 - 99 mg/dL 125  99  84   BUN 8 - 23 mg/dL '6  7  3   '$ Creatinine 0.44 - 1.00 mg/dL 0.50  0.71  0.77   Sodium 135 - 145 mmol/L 134  135  135   Potassium 3.5 - 5.1 mmol/L 3.4  3.1  3.7   Chloride 98 - 111 mmol/L 105  99  105   CO2 22 - 32 mmol/L '26  25  23   '$ Calcium 8.9 - 10.3 mg/dL 7.8  8.9  8.8   Total Protein 6.5 - 8.1 g/dL  7.5  8.5   Total Bilirubin 0.3 - 1.2 mg/dL  0.3  0.3   Alkaline Phos 38 - 126 U/L  137  115   AST 15 - 41 U/L  13  84   ALT 0 - 44 U/L  7  58       Microbiology: Recent Results (from the past 240 hour(s))  Blood culture (routine x 2)     Status: None (Preliminary result)   Collection Time: 05/15/22 11:48 PM   Specimen: BLOOD  Result Value Ref Range Status   Specimen Description BLOOD PORTA CATH  Final   Special Requests   Final    BOTTLES DRAWN AEROBIC AND ANAEROBIC Blood Culture results may not be optimal due to an inadequate volume of blood received in culture bottles   Culture   Final    NO GROWTH < 12 HOURS Performed at [facility], [address] [place], [place] [address]    Report Status PENDING  Incomplete  SARS Coronavirus 2 by RT PCR (hospital order, performed in [facility] hospital lab) *cepheid single result test* Anterior Nasal Swab     Status: None   Collection Time: 05/16/22  1:10 AM   Specimen: Anterior Nasal Swab  Result Value Ref Range Status   SARS Coronavirus 2 by RT PCR NEGATIVE NEGATIVE Final    Comment: (NOTE) SARS-CoV-2 target nucleic acids are NOT DETECTED.  The SARS-CoV-2 RNA is generally detectable in upper and lower respiratory specimens  during the acute phase of infection. The lowest concentration of SARS-CoV-2 viral copies this assay can detect is 250 copies / mL. A negative result does not preclude SARS-CoV-2 infection and should not be used as the sole basis for treatment or other patient management decisions.  A negative result may occur with improper specimen collection / handling, submission of specimen other than nasopharyngeal swab, presence of viral mutation(s) within the areas targeted by this assay, and inadequate number of viral copies (<250 copies / mL). A negative result must be combined with clinical observations, patient history, and epidemiological information.  Fact Sheet for Patients:   https://www.patel.info/  Fact Sheet for Healthcare Providers: https://hall.com/  This test is not yet approved or  cleared by the [place] FDA and has been authorized for detection and/or diagnosis of SARS-CoV-2 by FDA under an Emergency Use Authorization (EUA).  This EUA will remain in effect (meaning this test can be used) for the duration of the COVID-19 declaration under Section 564(b)(1) of the Act, 21 U.S.C. section 360bbb-3(b)(1), unless the authorization is terminated or revoked sooner.  Performed at [facility], [address] [place],  [address]     IMAGING RESULTS: CXR no infiltrate CT neck  I have personally reviewed the films ?left nodal conglomerate with area of necrosis Thrombosis of left IJ  Impression/Recommendation ?SCC of the soft palate on the left side s/p chemoradiation in 2022, now with recurrence with extranodal extension on the left with fungating dry mass and chronic Left IJ thrombosis  Left IJ thrombosis due to compression from tumor VS tumor thrombosis VS Septic thrombosis which  is less likely- eventhough at risk for infection,because of the tumor location with fusobacterium and strep and other anerobes from mouth  she  has no fever, no leucocytosis and no other features of Lemierre's  like lung emboli or proptosis  Currently on zosyn- change to unasyn and if culture is negative by 3-4 days then can stop Need to discuss with her oncologist at [facility] Dr.Sheth to see whether she is a candidate for clinical trial and further treatment VS hospice If she remains stable then could be seen at [facility] as OP rather than transfer  Coag neg staph is likely a contaminant from port- will not treat  Watch closely for complication like extension of thrombus to involve the sinuses in the brain Bleeding due to erosion into left carotid Aspiration pneumonia  Procal can be high in malignancy , but treat like infection currently  Dysphagia secondary to radiation complications -has  peg Malnutrtition ? Anemia  Hypotension on presentaion- she has soft BP usually- could be worsened by dehydration Check cortisol  Falls ? Pt says she has seizures Primary team to explore this further  Current tobacco /marijuana user ___________________________________________________ Discussed with patient, her son and requesting provider in detail Total time spent 60 min Note:  This document was prepared using Dragon voice recognition software and may include unintentional dictation errors.

## 2022-05-16 NOTE — Assessment & Plan Note (Addendum)
-   continue Seroquel and Depakote ER.  We will resume her mirtazapine and Cymbalta as well.

## 2022-05-16 NOTE — Progress Notes (Signed)
PHARMACIST - PHYSICIAN COMMUNICATION  CONCERNING:  Enoxaparin (Lovenox) for DVT Prophylaxis    Filed Weights   05/15/22 1740  Weight: 38.1 kg (84 lb)    Body mass index is 15.36 kg/m.  Estimated Creatinine Clearance: 44.4 mL/min (by C-G formula based on SCr of 0.5 mg/dL).  Patient is candidate for enoxaparin '30mg'$  every 24 hours based on CrCl <62m/min or Weight <45kg  DESCRIPTION: [facility]  Patient is receiving enoxaparin 30 mg every 24 hours    CWynelle Cleveland PharmD Clinical Pharmacist  05/16/2022 10:53 AM

## 2022-05-16 NOTE — ED Notes (Addendum)
Pt refusing 2nd set of blood cultures to be drawn at this time. MD Mansy notified.

## 2022-05-16 NOTE — Consult Note (Signed)
Pharmacy Antibiotic Note  Christina Porter is a 61 y.o. female admitted on 05/15/2022 with sepsis. Presented with hypotension and severe sepsis possibly 2/2 neck nodal conglomerate with necrosis. Past medical history includes metastatic throat cancer, bipolar 1 disorder, and seizures. Pharmacy has been consulted for cefepime and vancomycin dosing.   On day 2 of antibiotics. Renal function remains stable.   Plan: Cefepime 2 gram Q12H Vancomycin 1000 mg x 1 given in ED.  Continue vancomycin 500 mg Q24H. Goal AUC 400-550 Estimated AUC 441 Scr 0.8, TBW, Vd 0.72   Height: '5\' 2"'$  (157.5 cm) Weight: 38.1 kg (84 lb) IBW/kg (Calculated) : 50.1  Temp (24hrs), Avg:99.1 F (37.3 C), Min:98.7 F (37.1 C), Max:99.4 F (37.4 C)  Recent Labs  Lab 05/15/22 1758 05/16/22 0519  WBC 12.5* 10.8*  CREATININE 0.71 0.50     Estimated Creatinine Clearance: 44.4 mL/min (by C-G formula based on SCr of 0.5 mg/dL).    No Known Allergies  Antimicrobials this admission: 7/18 cefepime >>  7/18 Metronidazole >>  7/18 Vancomycin >>  Dose adjustments this admission:   Microbiology results: 7/17 BCx: NG < 12 hours 7/17 UCx: in process    Thank you for allowing pharmacy to be a part of this patient's care.  Wynelle Cleveland, PharmD Clinical Pharmacist   05/16/2022 8:26 AM

## 2022-05-16 NOTE — Progress Notes (Signed)
End of shift note:  Pt was admitted to the floor for IV antibiotic and pain management. Right chest port is SL. VS improving. Schedule medications were given through peg tube. Plan of care was reviewed with pt and pt's son.

## 2022-05-16 NOTE — Assessment & Plan Note (Addendum)
-   continues to smoke and vape unfortunately -Patient was found vaping in her room on 1 occasion during hospitalization and on another occasion she had left her room and was found smoking outside

## 2022-05-16 NOTE — Telephone Encounter (Signed)
Received inbound call from Sarajane Marek with [employer] calling to confirm patient admission. This RNCM advised patient is no longer in the ED and has been admitted. No additional TOC needs at this time.

## 2022-05-16 NOTE — Assessment & Plan Note (Addendum)
-   soft palate SCC; left neck FNA positive for malignancy consistent with metastatic SCC.  PET scan 12/15/2020 also notable for local metastatic disease - follows with Dr. Harriette Ohara at [facility] - s/p chemo (cisplatin) -Recently contacted by radiation oncology (Dr. Crisoforo Oxford) 05/12/2022.  They are trying to reschedule study injection and simulation appointments; she has missed prior appointments but endorses wanting to resume radiation -One of her biggest complaints is uncontrolled pain and apparently was out of pain medication for about 4 days prior to admission (according to patient). She states her medication "pouch" is missing and she thinks has been "stolen". Regardless she says she will not have any pain medication available to her when she goes home.  - per database: filled MS Contin 15 mg on 04/25/2022 (quantity 90 for 30-day supply).  Filled oxy IR 5 mg on 04/25/2022 (quantity 180, 30-day supply) - I provided patient a 2 week course of MS Contin and OxyIR at discharge; she is instructed to follow up with Dr. Venora Maples within this timeframe to obtain new scripts/refills; patient voiced understanding  - patient has followup appt with Dr. Harriette Ohara on 7/25 as well

## 2022-05-16 NOTE — Progress Notes (Signed)
Admission profile updated. ?

## 2022-05-16 NOTE — Consult Note (Signed)
PHARMACY -  BRIEF ANTIBIOTIC NOTE   Pharmacy has received consult(s) for cefepime and Vancomycin from an ED provider.  The patient's profile has been reviewed for ht/wt/allergies/indication/available labs.    One time order(s) placed for  Cefepime 2 gram Vancomycin 1000 mg  Further antibiotics/pharmacy consults should be ordered by admitting physician if indicated.                       Thank you, Dorothe Pea, PharmD, BCPS Clinical Pharmacist   05/16/2022  12:11 AM

## 2022-05-17 DIAGNOSIS — A419 Sepsis, unspecified organism: Secondary | ICD-10-CM | POA: Diagnosis not present

## 2022-05-17 DIAGNOSIS — C140 Malignant neoplasm of pharynx, unspecified: Secondary | ICD-10-CM | POA: Diagnosis not present

## 2022-05-17 LAB — CBC WITH DIFFERENTIAL/PLATELET
Abs Immature Granulocytes: 0.04 10*3/uL (ref 0.00–0.07)
Basophils Absolute: 0 10*3/uL (ref 0.0–0.1)
Basophils Relative: 0 %
Eosinophils Absolute: 0.2 10*3/uL (ref 0.0–0.5)
Eosinophils Relative: 3 %
HCT: 26.3 % — ABNORMAL LOW (ref 36.0–46.0)
Hemoglobin: 8.6 g/dL — ABNORMAL LOW (ref 12.0–15.0)
Immature Granulocytes: 0 %
Lymphocytes Relative: 6 %
Lymphs Abs: 0.6 10*3/uL — ABNORMAL LOW (ref 0.7–4.0)
MCH: 31.9 pg (ref 26.0–34.0)
MCHC: 32.7 g/dL (ref 30.0–36.0)
MCV: 97.4 fL (ref 80.0–100.0)
Monocytes Absolute: 1.2 10*3/uL — ABNORMAL HIGH (ref 0.1–1.0)
Monocytes Relative: 13 %
Neutro Abs: 7.3 10*3/uL (ref 1.7–7.7)
Neutrophils Relative %: 78 %
Platelets: 341 10*3/uL (ref 150–400)
RBC: 2.7 MIL/uL — ABNORMAL LOW (ref 3.87–5.11)
RDW: 13.9 % (ref 11.5–15.5)
WBC: 9.4 10*3/uL (ref 4.0–10.5)
nRBC: 0 % (ref 0.0–0.2)

## 2022-05-17 LAB — BASIC METABOLIC PANEL
Anion gap: 3 — ABNORMAL LOW (ref 5–15)
BUN: 6 mg/dL — ABNORMAL LOW (ref 8–23)
CO2: 27 mmol/L (ref 22–32)
Calcium: 8.5 mg/dL — ABNORMAL LOW (ref 8.9–10.3)
Chloride: 106 mmol/L (ref 98–111)
Creatinine, Ser: 0.5 mg/dL (ref 0.44–1.00)
GFR, Estimated: >60 mL/min (ref >60)
Glucose, Bld: 121 mg/dL — ABNORMAL HIGH (ref 70–99)
Potassium: 3.6 mmol/L (ref 3.5–5.1)
Sodium: 136 mmol/L (ref 135–145)

## 2022-05-17 LAB — CORTISOL-AM, BLOOD: Cortisol - AM: 12 ug/dL (ref 6.7–22.6)

## 2022-05-17 LAB — GLUCOSE, CAPILLARY
Glucose-Capillary: 117 mg/dL — ABNORMAL HIGH (ref 70–99)
Glucose-Capillary: 117 mg/dL — ABNORMAL HIGH (ref 70–99)
Glucose-Capillary: 119 mg/dL — ABNORMAL HIGH (ref 70–99)
Glucose-Capillary: 130 mg/dL — ABNORMAL HIGH (ref 70–99)
Glucose-Capillary: 133 mg/dL — ABNORMAL HIGH (ref 70–99)
Glucose-Capillary: 135 mg/dL — ABNORMAL HIGH (ref 70–99)
Glucose-Capillary: 142 mg/dL — ABNORMAL HIGH (ref 70–99)

## 2022-05-17 LAB — PHOSPHORUS
Phosphorus: 2.6 mg/dL (ref 2.5–4.6)
Phosphorus: 2.6 mg/dL (ref 2.5–4.6)

## 2022-05-17 LAB — MAGNESIUM
Magnesium: 1.5 mg/dL — ABNORMAL LOW (ref 1.7–2.4)
Magnesium: 2.1 mg/dL (ref 1.7–2.4)

## 2022-05-17 LAB — PROCALCITONIN: Procalcitonin: 3.69 ng/mL

## 2022-05-17 MED ORDER — POTASSIUM CHLORIDE 20 MEQ PO PACK
40.0000 meq | PACK | Freq: Once | ORAL | Status: AC
Start: 2022-05-17 — End: 2022-05-17
  Administered 2022-05-17: 40 meq
  Filled 2022-05-17: qty 2

## 2022-05-17 MED ORDER — MAGNESIUM SULFATE 2 GM/50ML IV SOLN
2.0000 g | Freq: Once | INTRAVENOUS | Status: AC
  Administered 2022-05-17: 2 g via INTRAVENOUS
  Filled 2022-05-17: qty 50

## 2022-05-17 MED ORDER — GABAPENTIN 100 MG PO CAPS
200.0000 mg | ORAL_CAPSULE | Freq: Three times a day (TID) | ORAL | Status: DC
  Administered 2022-05-17 – 2022-05-19 (×7): 200 mg via ORAL
  Filled 2022-05-17 (×7): qty 2

## 2022-05-17 NOTE — Progress Notes (Signed)
Progress Note    Christina Porter   [id number]:[id number]  DOB: 1960/11/11  DOA: 05/15/2022     1 PCP: Neomia Dear, MD  Initial CC: Uncontrolled pain, hypotension, falling  Hospital Course: Christina Porter is a 61 yo AA female with PMH soft palate SCC (follows with Dr. Harriette Ohara, [facility]), bipolar disorder, tobacco use, chronic pain who presented with hypotension, uncontrolled pain (out of pain meds x 2 days), and recent falls. She underwent extensive work-up on admission due to concern for sepsis. She was afebrile, noted to be tachycardic, and hypotensive (72/58). CT soft tissue neck noted nodal conglomerate mass with area of necrosis. She was started on antibiotics and admitted for further work-up and monitoring.   Interval History:  No events overnight.  Resting in bed when seen this morning.  Pain is still her biggest complaint and issue but is more controlled after being restarted back on pain medication yesterday. She denies any fevers, chills. Recommended that she needs to follow-up outpatient as planned with radiation oncology for further discussion regarding treatment for her neck mass.  She further confirms that she is missing her medication pouch at home and would not have any pain medications at time of discharge. I have placed a phone call to her provider at [facility] to further discuss her pain regimen plan prior to discharge.  Assessment and Plan: * Sepsis ([place]) - Leukocytosis, tachycardia, tachypnea, suspected source of necrotic neck mass which is known - Initially started on vancomycin, cefepime, Flagyl and transitioned to Unasyn after ID evaluation -Continue following cultures - cortisol: Normal - Procalcitonin downtrending  Internal jugular vein thrombosis, left ([place]) - Left IJ thrombosis noted on CT on admission.  In setting of underlying neck cancer, was some concern for septic thrombophlebitis.  She is however asymptomatic with no complaints of distant organ involvement and  has no high-grade fever or significant leukocytosis. -Have asked for ID evaluation for further opinion as well.  Currently low suspicion also -Antibiotics have been transitioned to Unasyn and we will continue to follow cultures -Last CT neck noted in care everywhere from 04/18/2022: Left IJ not visualized "similar to prior, likely reflecting chronic occlusion or compression".  Seizure disorder [facility]) - Prior notes reviewed in epic, notably June 2023 admission at [facility] when she was also evaluated by neurology.  She has mention of seizure disorder history but notes indicate not on medications. 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  PET scan 12/15/2020 also notable for local metastatic disease - follows with Dr. Harriette Ohara at [facility] - s/p chemo (cisplatin) - last heme/onc OV 02/15/21 -Recently contacted by radiation oncology (Dr. Crisoforo Oxford) 05/12/2022.  They are trying to reschedule study injection and simulation appointments; she has missed prior appointments but endorses wanting to resume radiation - will discuss case with Dr. Harriette Ohara regarding rec's while patient is hospitalized (called office and left message for callback on 7/19) -One of her biggest complaints is uncontrolled pain and apparently was out of pain medication for about 4 days prior to admission (according to patient). She states her medication "pouch" is missing and she think has been "stolen". Regardless she  says she will not have any pain medication available to her when she goes home.  - per database: filled MS Contin 15 mg on 04/25/2022  (quantity 90 for 30-day supply).  Filled oxy IR 5 mg on 04/25/2022 (quantity 180, 30-day supply) - For now, continue on morphine IV for breakthrough pain control and oxycodone (higher than home dosing) - continue MS contin   Contamination of blood culture - 1/4 bottles from admission blood cultures growing Staph (species pending); for now is presumed contaminate (noted from port a cath)  Hypokalemia - replete as needed  Bipolar 1 disorder ([place]) - continue Seroquel and Depakote ER.  We will resume her mirtazapine and Cymbalta as well.  Protein-calorie malnutrition, severe - RD consulted - continue FLD and resume TF  Tobacco abuse - She will be counseled for smoking cessation.   Old records reviewed in assessment of this patient  Antimicrobials: Cefepime 05/16/22 x 1 Vanc 05/16/22 x 2 doses Zosyn 05/16/22 x 1 Unasyn 05/16/22 >> current  DVT prophylaxis:  enoxaparin (LOVENOX) injection 30 mg Start: 05/16/22 0300   Code Status:   Code Status: Full Code  Mobility Assessment (last 72 hours)     Mobility Assessment     Row Name 05/17/22 0900 05/16/22 2051 05/16/22 1328 05/16/22 0759     Does patient have an order for bedrest or is patient medically unstable No - Continue assessment No - Continue assessment No - Continue assessment No - Continue assessment    What is the highest level of mobility based on the progressive mobility assessment? Level 5 (Walks with assist in room/hall) - Balance while stepping forward/back and can walk in room with assist - Complete Level 5 (Walks with assist in room/hall) - Balance while stepping forward/back and can walk in room with assist - Complete Level 5 (Walks with assist in room/hall) - Balance while stepping forward/back and can walk in room with assist - Complete --             Disposition Plan:  Home in 1-2 days Status is: Inpt  Objective: Blood pressure (!) 89/67, pulse 99, temperature 98.1 F (36.7 C), resp. rate 18, height '5\' 2"'$   (1.914 m), weight 42 kg, SpO2 100 %.  Examination:  Physical Exam Constitutional:      General: She is not in acute distress.    Appearance: Normal appearance.  HENT:     Head: Normocephalic and atraumatic.     Mouth/Throat:     Mouth: Mucous membranes are dry.  Eyes:     Extraocular Movements: Extraocular movements intact.  Neck:     Comments: ~4 cm indurated left sided neck mass noted which is TTP Cardiovascular:     Rate and Rhythm: Normal rate and regular rhythm.  Pulmonary:     Effort: Pulmonary effort is normal.     Breath sounds: Normal breath sounds.  Abdominal:     General: Bowel sounds are normal. There is no distension.     Palpations: Abdomen is soft.     Tenderness: There is no abdominal tenderness.  Musculoskeletal:        General: Normal range of motion.  Skin:    General: Skin is warm and dry.  Neurological:     General: No focal deficit present.     Mental Status: She is alert.  Psychiatric:        Mood and Affect: Mood normal.      Consultants:  ID  Procedures:    Data Reviewed: Results  for orders placed or performed during the hospital encounter of 05/15/22 (from the past 24 hour(s))  Glucose, capillary     Status: None   Collection Time: 05/16/22  5:05 PM  Result Value Ref Range   Glucose-Capillary 99 70 - 99 mg/dL  Glucose, capillary     Status: Abnormal   Collection Time: 05/16/22  8:07 PM  Result Value Ref Range   Glucose-Capillary 111 (H) 70 - 99 mg/dL  Magnesium     Status: Abnormal   Collection Time: 05/16/22 10:07 PM  Result Value Ref Range   Magnesium 1.5 (L) 1.7 - 2.4 mg/dL  Phosphorus     Status: None   Collection Time: 05/16/22 10:07 PM  Result Value Ref Range   Phosphorus 2.6 2.5 - 4.6 mg/dL  Glucose, capillary     Status: Abnormal   Collection Time: 05/17/22 12:09 AM  Result Value Ref Range   Glucose-Capillary 117 (H) 70 - 99 mg/dL  Glucose, capillary     Status: Abnormal   Collection Time: 05/17/22  3:22 AM  Result  Value Ref Range   Glucose-Capillary 130 (H) 70 - 99 mg/dL  Procalcitonin     Status: None   Collection Time: 05/17/22  4:34 AM  Result Value Ref Range   Procalcitonin 3.69 ng/mL  Magnesium     Status: Abnormal   Collection Time: 05/17/22  4:34 AM  Result Value Ref Range   Magnesium 1.5 (L) 1.7 - 2.4 mg/dL  Phosphorus     Status: None   Collection Time: 05/17/22  4:34 AM  Result Value Ref Range   Phosphorus 2.6 2.5 - 4.6 mg/dL  Cortisol-am, blood     Status: None   Collection Time: 05/17/22  4:34 AM  Result Value Ref Range   Cortisol - AM 12.0 6.7 - 22.6 ug/dL  Basic metabolic panel     Status: Abnormal   Collection Time: 05/17/22  4:34 AM  Result Value Ref Range   Sodium 136 135 - 145 mmol/L   Potassium 3.6 3.5 - 5.1 mmol/L   Chloride 106 98 - 111 mmol/L   CO2 27 22 - 32 mmol/L   Glucose, Bld 121 (H) 70 - 99 mg/dL   BUN 6 (L) 8 - 23 mg/dL   Creatinine, Ser 0.50 0.44 - 1.00 mg/dL   Calcium 8.5 (L) 8.9 - 10.3 mg/dL   GFR, Estimated >60 >60 mL/min   Anion gap 3 (L) 5 - 15  CBC with Differential/Platelet     Status: Abnormal   Collection Time: 05/17/22  4:34 AM  Result Value Ref Range   WBC 9.4 4.0 - 10.5 K/uL   RBC 2.70 (L) 3.87 - 5.11 MIL/uL   Hemoglobin 8.6 (L) 12.0 - 15.0 g/dL   HCT 26.3 (L) 36.0 - 46.0 %   MCV 97.4 80.0 - 100.0 fL   MCH 31.9 26.0 - 34.0 pg   MCHC 32.7 30.0 - 36.0 g/dL   RDW 13.9 11.5 - 15.5 %   Platelets 341 150 - 400 K/uL   nRBC 0.0 0.0 - 0.2 %   Neutrophils Relative % 78 %   Neutro Abs 7.3 1.7 - 7.7 K/uL   Lymphocytes Relative 6 %   Lymphs Abs 0.6 (L) 0.7 - 4.0 K/uL   Monocytes Relative 13 %   Monocytes Absolute 1.2 (H) 0.1 - 1.0 K/uL   Eosinophils Relative 3 %   Eosinophils Absolute 0.2 0.0 - 0.5 K/uL   Basophils Relative 0 %   Basophils Absolute  0.0 0.0 - 0.1 K/uL   Immature Granulocytes 0 %   Abs Immature Granulocytes 0.04 0.00 - 0.07 K/uL  Glucose, capillary     Status: Abnormal   Collection Time: 05/17/22  8:12 AM  Result Value Ref  Range   Glucose-Capillary 117 (H) 70 - 99 mg/dL   Comment 1 Notify RN    Comment 2 Document in Chart   Glucose, capillary     Status: Abnormal   Collection Time: 05/17/22 11:40 AM  Result Value Ref Range   Glucose-Capillary 119 (H) 70 - 99 mg/dL   Comment 1 Notify RN    Comment 2 Document in Chart   Glucose, capillary     Status: Abnormal   Collection Time: 05/17/22  3:40 PM  Result Value Ref Range   Glucose-Capillary 142 (H) 70 - 99 mg/dL    I have Reviewed nursing notes, Vitals, and Lab results since pt's last encounter. Pertinent lab results : see above I have ordered test including BMP, CBC, Mg I have reviewed the last note from staff over past 24 hours I have discussed pt's care plan and test results with nursing staff, case manager   LOS: 1 day   Dwyane Dee, MD [facility] 05/17/2022, 4:06 PM

## 2022-05-17 NOTE — Plan of Care (Signed)

## 2022-05-18 DIAGNOSIS — I82C12 Acute embolism and thrombosis of left internal jugular vein: Secondary | ICD-10-CM | POA: Diagnosis not present

## 2022-05-18 DIAGNOSIS — C051 Malignant neoplasm of soft palate: Secondary | ICD-10-CM | POA: Diagnosis not present

## 2022-05-18 LAB — CBC WITH DIFFERENTIAL/PLATELET
Abs Immature Granulocytes: 0.06 10*3/uL (ref 0.00–0.07)
Basophils Absolute: 0 10*3/uL (ref 0.0–0.1)
Basophils Relative: 0 %
Eosinophils Absolute: 0.3 10*3/uL (ref 0.0–0.5)
Eosinophils Relative: 3 %
HCT: 25.9 % — ABNORMAL LOW (ref 36.0–46.0)
Hemoglobin: 8.6 g/dL — ABNORMAL LOW (ref 12.0–15.0)
Immature Granulocytes: 1 %
Lymphocytes Relative: 6 %
Lymphs Abs: 0.6 10*3/uL — ABNORMAL LOW (ref 0.7–4.0)
MCH: 32.7 pg (ref 26.0–34.0)
MCHC: 33.2 g/dL (ref 30.0–36.0)
MCV: 98.5 fL (ref 80.0–100.0)
Monocytes Absolute: 1.4 10*3/uL — ABNORMAL HIGH (ref 0.1–1.0)
Monocytes Relative: 14 %
Neutro Abs: 7.4 10*3/uL (ref 1.7–7.7)
Neutrophils Relative %: 76 %
Platelets: 335 10*3/uL (ref 150–400)
RBC: 2.63 MIL/uL — ABNORMAL LOW (ref 3.87–5.11)
RDW: 14.1 % (ref 11.5–15.5)
WBC: 9.7 10*3/uL (ref 4.0–10.5)
nRBC: 0 % (ref 0.0–0.2)

## 2022-05-18 LAB — BASIC METABOLIC PANEL
Anion gap: 4 — ABNORMAL LOW (ref 5–15)
BUN: 6 mg/dL — ABNORMAL LOW (ref 8–23)
CO2: 29 mmol/L (ref 22–32)
Calcium: 8.5 mg/dL — ABNORMAL LOW (ref 8.9–10.3)
Chloride: 103 mmol/L (ref 98–111)
Creatinine, Ser: 0.54 mg/dL (ref 0.44–1.00)
GFR, Estimated: >60 mL/min (ref >60)
Glucose, Bld: 123 mg/dL — ABNORMAL HIGH (ref 70–99)
Potassium: 3.9 mmol/L (ref 3.5–5.1)
Sodium: 136 mmol/L (ref 135–145)

## 2022-05-18 LAB — GLUCOSE, CAPILLARY
Glucose-Capillary: 133 mg/dL — ABNORMAL HIGH (ref 70–99)
Glucose-Capillary: 123 mg/dL — ABNORMAL HIGH (ref 70–99)
Glucose-Capillary: 143 mg/dL — ABNORMAL HIGH (ref 70–99)
Glucose-Capillary: 113 mg/dL — ABNORMAL HIGH (ref 70–99)

## 2022-05-18 LAB — CULTURE, BLOOD (ROUTINE X 2)

## 2022-05-18 LAB — MAGNESIUM: Magnesium: 1.7 mg/dL (ref 1.7–2.4)

## 2022-05-18 LAB — URINE CULTURE

## 2022-05-18 LAB — PHOSPHORUS: Phosphorus: 3 mg/dL (ref 2.5–4.6)

## 2022-05-18 LAB — PROCALCITONIN: Procalcitonin: 1.7 ng/mL

## 2022-05-18 MED ORDER — GUAIFENESIN 100 MG/5ML PO LIQD: 10.0000 mL | ORAL | Status: DC | PRN

## 2022-05-18 NOTE — Progress Notes (Signed)
Progress Note    Lya R. Carleah Yablonski   ZOX:[id number]  DOB: [birth year]  DOA: 05/15/2022     2 PCP: Neomia Dear, MD  Initial CC: Uncontrolled pain, hypotension, falling  Hospital Course: [name] is a 61 yo AA female with PMH soft palate SCC (follows with Dr. Harriette Ohara, [facility]), bipolar disorder, tobacco use, chronic pain who presented with hypotension, uncontrolled pain (out of pain meds x 2 days), and recent falls. She underwent extensive work-up on admission due to concern for sepsis. She was afebrile, noted to be tachycardic, and hypotensive (72/58). CT soft tissue neck noted nodal conglomerate mass with area of necrosis. She was started on antibiotics and admitted for further work-up and monitoring.   Interval History:  Patient found to be vaping overnight then complained of congestion this morning.  To vapes were confiscated by nursing and checked into security. This morning she is otherwise still fixated on the mass on her left neck thinking it is getting bigger.  I was able to discuss her pain regimen with Dr. Venora Maples yesterday.  I will plan on bridging her with pain medication until she can make a follow-up appointment outpatient.  Patient understands this plan.  Assessment and Plan: * Sepsis ([place]) - Leukocytosis, tachycardia, tachypnea, suspected source of necrotic neck mass which is known - Initially started on vancomycin, cefepime, Flagyl and transitioned to Unasyn after ID evaluation -Continue following cultures - cortisol: Normal - Procalcitonin downtrending  Internal jugular vein thrombosis, left ([place]) - Left IJ thrombosis noted on CT on admission.  In setting of underlying neck cancer, was some concern for septic thrombophlebitis.  She is however asymptomatic with no complaints of distant organ involvement and has no high-grade fever or significant leukocytosis. -Have asked for ID evaluation for further opinion as well.  Currently low suspicion also -Antibiotics  have been transitioned to Unasyn and we will continue to follow cultures -Last CT neck noted in care everywhere from 04/18/2022: Left IJ not visualized "similar to prior, likely reflecting chronic occlusion or compression".  Seizure disorder [facility]) - Prior notes reviewed in epic, notably June 2023 admission at [facility] when she was also evaluated by neurology.  She has mention of seizure disorder history but notes indicate not on medications. 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  PET scan 12/15/2020 also notable for local metastatic disease - follows with Dr. Harriette Ohara at [facility] - s/p chemo (cisplatin) - last heme/onc OV 02/15/21 -Recently contacted by radiation oncology (Dr. Crisoforo Oxford) 05/12/2022.  They are trying to reschedule study injection and simulation appointments; she has missed prior appointments but endorses wanting to resume radiation - will discuss case with Dr. Harriette Ohara regarding rec's while patient is hospitalized (called office and left message for callback on 7/19) -One of her biggest complaints is uncontrolled pain and apparently was out of pain medication for about 4 days prior to admission (according to patient). She states her medication "pouch" is missing and she think has been "stolen". Regardless she says she will not have any pain medication available to her when she goes home.  - per database: filled MS Contin 15 mg on  04/25/2022 (quantity 90 for 30-day supply).  Filled oxy IR 5 mg on 04/25/2022 (quantity 180, 30-day supply) - For now, continue on morphine IV for breakthrough pain control  and oxycodone (higher than home dosing) - continue MS contin   Contamination of blood culture - 1/4 bottles from admission blood cultures growing Staph (species pending); for now is presumed contaminate (noted from port a cath)  Hypokalemia - replete as needed  Bipolar 1 disorder ([place]) - continue Seroquel and Depakote ER.  We will resume her mirtazapine and Cymbalta as well.  Protein-calorie malnutrition, severe - RD consulted - continue FLD and resume TF  Tobacco abuse - She will be counseled for smoking cessation.   Old records reviewed in assessment of this patient  Antimicrobials: Cefepime 05/16/22 x 1 Vanc 05/16/22 x 2 doses Zosyn 05/16/22 x 1 Unasyn 05/16/22 >> current  DVT prophylaxis:  enoxaparin (LOVENOX) injection 30 mg Start: 05/16/22 0300   Code Status:   Code Status: Full Code  Mobility Assessment (last 72 hours)     Mobility Assessment     Row Name 05/17/22 2200 05/17/22 0900 05/16/22 2051 05/16/22 1328 05/16/22 0759   Does patient have an order for bedrest or is patient medically unstable No - Continue assessment No - Continue assessment No - Continue assessment No - Continue assessment No - Continue assessment   What is the highest level of mobility based on the progressive mobility assessment? Level 5 (Walks with assist in room/hall) - Balance while stepping forward/back and can walk in room with assist - Complete Level 5 (Walks with assist in room/hall) - Balance while stepping forward/back and can walk in room with assist - Complete Level 5 (Walks with assist in room/hall) - Balance while stepping forward/back and can walk in room with assist - Complete Level 5 (Walks with assist in room/hall) - Balance while stepping forward/back and can walk in room with assist - Complete --            Disposition Plan:  Home in 1-2 days Status is: Inpt  Objective: Blood pressure 113/89, pulse (!) 112, temperature 98.2 F (36.8 C), temperature source Oral, resp.  rate 18, height '5\' 2"'$  (1.575 m), weight 42 kg, SpO2 100 %.  Examination:  Physical Exam Constitutional:      General: She is not in acute distress.    Appearance: Normal appearance.  HENT:     Head: Normocephalic and atraumatic.     Mouth/Throat:     Mouth: Mucous membranes are dry.  Eyes:     Extraocular Movements: Extraocular movements intact.  Neck:     Comments: ~4 cm indurated left sided neck mass noted which is TTP Cardiovascular:     Rate and Rhythm: Normal rate and regular rhythm.  Pulmonary:     Effort: Pulmonary effort is normal.     Breath sounds: Normal breath sounds.  Abdominal:     General: Bowel sounds are normal. There is no distension.     Palpations: Abdomen is soft.     Tenderness: There is no abdominal tenderness.  Musculoskeletal:        General: Normal range of motion.  Skin:    General: Skin is warm and dry.  Neurological:     General: No focal deficit present.     Mental Status: She is alert.  Psychiatric:        Mood and Affect: Mood normal.      Consultants:  ID  Procedures:    Data Reviewed:  Results for orders placed or performed during the hospital encounter of 05/15/22 (from the past 24 hour(s))  Magnesium     Status: None   Collection Time: 05/17/22  6:04 PM  Result Value Ref Range   Magnesium 2.1 1.7 - 2.4 mg/dL  Phosphorus     Status: None   Collection Time: 05/17/22  6:04 PM  Result Value Ref Range   Phosphorus 2.6 2.5 - 4.6 mg/dL  Glucose, capillary     Status: Abnormal   Collection Time: 05/17/22  8:17 PM  Result Value Ref Range   Glucose-Capillary 133 (H) 70 - 99 mg/dL  Glucose, capillary     Status: Abnormal   Collection Time: 05/17/22 11:29 PM  Result Value Ref Range   Glucose-Capillary 135 (H) 70 - 99 mg/dL  Glucose, capillary     Status: Abnormal   Collection Time: 05/18/22  3:41 AM  Result Value Ref Range   Glucose-Capillary 133 (H) 70 - 99 mg/dL  Procalcitonin     Status: None   Collection Time: 05/18/22  3:53  AM  Result Value Ref Range   Procalcitonin 1.70 ng/mL  Magnesium     Status: None   Collection Time: 05/18/22  3:53 AM  Result Value Ref Range   Magnesium 1.7 1.7 - 2.4 mg/dL  Phosphorus     Status: None   Collection Time: 05/18/22  3:53 AM  Result Value Ref Range   Phosphorus 3.0 2.5 - 4.6 mg/dL  Basic metabolic panel     Status: Abnormal   Collection Time: 05/18/22  3:53 AM  Result Value Ref Range   Sodium 136 135 - 145 mmol/L   Potassium 3.9 3.5 - 5.1 mmol/L   Chloride 103 98 - 111 mmol/L   CO2 29 22 - 32 mmol/L   Glucose, Bld 123 (H) 70 - 99 mg/dL   BUN 6 (L) 8 - 23 mg/dL   Creatinine, Ser 0.54 0.44 - 1.00 mg/dL   Calcium 8.5 (L) 8.9 - 10.3 mg/dL   GFR, Estimated >60 >60 mL/min   Anion gap 4 (L) 5 - 15  CBC with Differential/Platelet     Status: Abnormal   Collection Time: 05/18/22  3:53 AM  Result Value Ref Range   WBC 9.7 4.0 - 10.5 K/uL   RBC 2.63 (L) 3.87 - 5.11 MIL/uL   Hemoglobin 8.6 (L) 12.0 - 15.0 g/dL   HCT 25.9 (L) 36.0 - 46.0 %   MCV 98.5 80.0 - 100.0 fL   MCH 32.7 26.0 - 34.0 pg   MCHC 33.2 30.0 - 36.0 g/dL   RDW 14.1 11.5 - 15.5 %   Platelets 335 150 - 400 K/uL   nRBC 0.0 0.0 - 0.2 %   Neutrophils Relative % 76 %   Neutro Abs 7.4 1.7 - 7.7 K/uL   Lymphocytes Relative 6 %   Lymphs Abs 0.6 (L) 0.7 - 4.0 K/uL   Monocytes Relative 14 %   Monocytes Absolute 1.4 (H) 0.1 - 1.0 K/uL   Eosinophils Relative 3 %   Eosinophils Absolute 0.3 0.0 - 0.5 K/uL   Basophils Relative 0 %   Basophils Absolute 0.0 0.0 - 0.1 K/uL   Immature Granulocytes 1 %   Abs Immature Granulocytes 0.06 0.00 - 0.07 K/uL  Glucose, capillary     Status: Abnormal   Collection Time: 05/18/22  7:45 AM  Result Value Ref Range   Glucose-Capillary 123 (H) 70 - 99 mg/dL  Glucose, capillary     Status: Abnormal  Collection Time: 05/18/22 12:05 PM  Result Value Ref Range   Glucose-Capillary 143 (H) 70 - 99 mg/dL    I have Reviewed nursing notes, Vitals, and Lab results since pt's last  encounter. Pertinent lab results : see above I have ordered test including BMP, CBC, Mg I have reviewed the last note from staff over past 24 hours I have discussed pt's care plan and test results with nursing staff, case manager   LOS: 2 days   Dwyane Dee, MD [facility] 05/18/2022, 4:23 PM

## 2022-05-18 NOTE — Progress Notes (Signed)
2200-During med pass, I caught patient vaping in her room. Educated her that she cannot use any type of smoking devices within the hospital. I took 2 vapes from patient and asked secretary to have security confiscate it until patient leaves. Secretary stated to place vapes in bag with label and place it in patient's bin for her to retrieve at discharge-placed in labeled bag in patient's bin.  0000-I set patient's bed alarm at night because she has a propensity to become confused at night. She was educated during med pass to not get up under any circumstances without help. Her bed alarm went off shortly after midnight. Went into room and found patient standing up with blood on her gown under her port. Patient had took the entire line and end cap off of port. Educated patient that in doing that, she can introduce deadly infections into her bloodstream. Also, re-educated her about falls and calling staff before getting up st night alone. Patient does not follow directions well. Seems to become forgetful. Tried to change gown but patient took gown off and stated that she did not want another one on.

## 2022-05-18 NOTE — Progress Notes (Addendum)
ID Patient is complaining of worsening swelling on the left side of the face Also states that her face is totally twisted She apparently was out of the room and was found smoking cigarette outside and was brought back by wheelchair No fever  O/e awake an alert No obvious distress Not septic Left facial palsy Left side of face swollen Left upper neck mas sis fungating and some blood seen 7/20     7/18   Port on the chest wall Bilateral air entry Heart sound S1-S2 Abdomen soft PEG in place CNS Left facial palsy Otherwise nonfocal Labs    Latest Ref Rng & Units 05/18/2022    3:53 AM 05/17/2022    4:34 AM 05/16/2022    5:19 AM  CBC  WBC 4.0 - 10.5 K/uL 9.7  9.4  10.8   Hemoglobin 12.0 - 15.0 g/dL 8.6  8.6  9.5   Hematocrit 36.0 - 46.0 % 25.9  26.3  28.5   Platelets 150 - 400 K/uL 335  341  380         Latest Ref Rng & Units 05/18/2022    3:53 AM 05/17/2022    4:34 AM 05/16/2022    5:19 AM  CMP  Glucose 70 - 99 mg/dL 123  121  125   BUN 8 - 23 mg/dL '6  6  6   '$ Creatinine 0.44 - 1.00 mg/dL 0.54  0.50  0.50   Sodium 135 - 145 mmol/L 136  136  134   Potassium 3.5 - 5.1 mmol/L 3.9  3.6  3.4   Chloride 98 - 111 mmol/L 103  106  105   CO2 22 - 32 mmol/L '29  27  26   '$ Calcium 8.9 - 10.3 mg/dL 8.5  8.5  7.8     Micro Blood culture ng  Impression/recommendation  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  Septic thrombosis is unlikely Even though at risk for infection because of the tumor location within the oral cavity ( anaerobes in the oral cavity likeFusobacterium and Streptococcus )she does not have any fever or leukocytosis or no other features of lemierre's like lung emboli.  There is no proptosis But there is concern that the thrombus may extend into the  sigmoid sinus and cavernous sinus. Patient is currently on Unasyn The swelling on the left side of the face is increasing. Wonder whether she needs steroids The most important thing is to get her  back to her oncologist at [facility] for further care.  Her oncologist Dr. Harriette Ohara is aware. She is currently on Unasyn but on discharge can be switched to Augmentin for 2 weeks. 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  Has PEG Anemia Hypotension on presentation but she always has had soft BP.  This was worsened by dehydration and is better now.  Normal cortisol.  Discussed with patient and Dr. Sabino Gasser in great detail

## 2022-05-18 NOTE — Progress Notes (Signed)
Patient called out for pain medicine for neck. When got into room with morphine, patient stated that she had congestion. Congestion seems to have come after she was caught vaping, however, patient claims that she has had congestion for 2 days. Saw a few blood stained tissues where patient had blown  her nose so hard until it bled. No active bleeding seen currently. Will report to day shift nurse.

## 2022-05-18 NOTE — Progress Notes (Signed)
Patient left the unit around 1630. Several staff members including security went to find patient. Patient was found outside smoking a cigarette. Patient was returned by wheelchair to room.

## 2022-05-19 DIAGNOSIS — A419 Sepsis, unspecified organism: Secondary | ICD-10-CM | POA: Diagnosis not present

## 2022-05-19 DIAGNOSIS — I82C12 Acute embolism and thrombosis of left internal jugular vein: Secondary | ICD-10-CM | POA: Diagnosis not present

## 2022-05-19 DIAGNOSIS — C140 Malignant neoplasm of pharynx, unspecified: Secondary | ICD-10-CM | POA: Diagnosis not present

## 2022-05-19 LAB — GLUCOSE, CAPILLARY
Glucose-Capillary: 110 mg/dL — ABNORMAL HIGH (ref 70–99)
Glucose-Capillary: 114 mg/dL — ABNORMAL HIGH (ref 70–99)
Glucose-Capillary: 116 mg/dL — ABNORMAL HIGH (ref 70–99)
Glucose-Capillary: 142 mg/dL — ABNORMAL HIGH (ref 70–99)

## 2022-05-19 MED ORDER — OXYCODONE HCL 5 MG PO TABS: 5.0000 mg | ORAL_TABLET | ORAL | 0 refills | Status: DC | PRN

## 2022-05-19 MED ORDER — MORPHINE SULFATE ER 15 MG PO TBCR: 15.0000 mg | EXTENDED_RELEASE_TABLET | Freq: Three times a day (TID) | ORAL | 0 refills | Status: DC

## 2022-05-19 MED ORDER — AMOXICILLIN-POT CLAVULANATE 875-125 MG PO TABS: 1.0000 | ORAL_TABLET | Freq: Two times a day (BID) | ORAL | 0 refills | Status: AC

## 2022-05-19 MED ORDER — GABAPENTIN 300 MG PO CAPS: 600.0000 mg | ORAL_CAPSULE | Freq: Three times a day (TID) | ORAL | 0 refills | Status: DC

## 2022-05-19 MED ORDER — ACETAMINOPHEN 325 MG PO TABS: 650.0000 mg | ORAL_TABLET | ORAL | Status: AC | PRN

## 2022-05-19 NOTE — Discharge Instructions (Signed)
Follow up with Dr. Harriette Ohara on 05/23/22

## 2022-05-19 NOTE — Discharge Summary (Signed)
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 declaration under Section 564(b)(1) of the Act, 21 U.S.C. section 360bbb-3(b)(1), unless the authorization is terminated or revoked sooner.  Performed at [facility], [address] [place], [place] [address]   Culture, blood (Routine X 2) w Reflex to ID Panel     Status: None (Preliminary result)   Collection Time: 05/16/22  1:23 PM   Specimen: BLOOD RIGHT HAND  Result Value Ref Range Status   Specimen Description BLOOD RIGHT HAND   Final   Special Requests   Final    BOTTLES DRAWN AEROBIC AND ANAEROBIC Blood Culture adequate volume   Culture   Final    NO GROWTH 3 DAYS Performed at [facility], [address] [place], [place] [address]    Report Status PENDING  Incomplete    Procedures/Studies: MR Brain W and Wo Contrast  Result Date: 05/16/2022 CLINICAL DATA:  Seizures and balance loss. History of head neck carcinoma. EXAM: MRI HEAD WITHOUT AND WITH CONTRAST TECHNIQUE: Multiplanar, multiecho pulse sequences of the brain and surrounding structures were obtained without and with intravenous contrast. CONTRAST:  28m GADAVIST GADOBUTROL 1 MMOL/ML IV SOLN COMPARISON:  None Available. FINDINGS: Brain: No acute infarct, mass effect or extra-axial collection. No acute or chronic hemorrhage. Normal white matter signal, parenchymal volume and CSF spaces. The midline structures are normal. Vascular: Major flow voids are preserved. Skull and upper cervical spine: Findings within the soft tissues of the left neck are more completely described on the concomitant CT of the neck. Briefly, there is confluent lymphadenopathy with central necrosis and a postauricular skin lesion. Normal calvarium. Sinuses/Orbits:No paranasal sinus fluid levels or advanced mucosal thickening. No mastoid or middle ear effusion. Normal orbits. IMPRESSION: 1. Normal brain.  No intracranial metastatic disease. 2. Findings of disease spread within the soft tissues of the left neck are more completely described on the concomitant CT of the neck. Electronically Signed   By: KUlyses JarredM.D.   On: 05/16/2022 01:04   CT Soft Tissue Neck W Contrast  Result Date: 05/15/2022 CLINICAL DATA:  Soft tissue swelling. History of head neck carcinoma. EXAM: CT NECK WITH CONTRAST TECHNIQUE: Multidetector CT imaging of the neck was performed using the standard protocol following the bolus administration of intravenous contrast. RADIATION DOSE REDUCTION: This exam was  performed according to the departmental dose-optimization program which includes automated exposure control, adjustment of the mA and/or kV according to patient size and/or use of iterative reconstruction technique. CONTRAST:  746mOMNIPAQUE IOHEXOL 300 MG/ML  SOLN COMPARISON:  None Available. FINDINGS: PHARYNX AND LARYNX: Diffuse edema of the soft tissues of the oropharynx, hypopharynx and larynx, including the epiglottis. There is a small retropharyngeal effusion but no retropharyngeal or peritonsillar abscess. SALIVARY GLANDS: Increased salivary gland enhancement is likely a post  treatment effect. THYROID: Normal. LYMPH NODES: Left level 2A nodal conglomerate with area of central necrosis that measures approximately 1.4 cm. There is an enhancing 5 mm right level 1A node. VASCULAR: The left internal jugular vein is thrombosed. There is abnormal contrast enhancement surrounding its course in the left neck, just distal to the carotid bifurcation. The other major vessels are patent. There is a right chest wall Port-A-Cath with its tip below the field of view. LIMITED INTRACRANIAL: Normal. VISUALIZED ORBITS: Normal. MASTOIDS AND VISUALIZED PARANASAL SINUSES: No fluid levels or advanced mucosal thickening. No mastoid effusion. SKELETON: No bony spinal canal stenosis. No lytic or blastic lesions. UPPER CHEST: Clear. OTHER: There is a left retro auricular skin lesion measuring 2.5 x 1.3 cm. IMPRESSION: 1. Area of abnormal contrast enhancement at left level 2A, likely nodal conglomerate with areas of necrosis. There is likely extranodal extension with a cutaneous lesion of the retroauricular soft tissue. 2. Diffuse edema of the soft tissues of the oropharynx, hypopharynx and larynx, likely sequela of radiation therapy. 3. 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 Electronically Signed   By: Ulyses Jarred M.D.   On: 05/15/2022 23:03   DG Chest 2 View  Result Date: 05/15/2022 CLINICAL DATA:  Weakness EXAM: CHEST - 2  VIEW COMPARISON:  Chest radiograph 08/31/2013 FINDINGS: A right chest wall port is in place with tip terminating in the right atrium. The cardiomediastinal silhouette is normal. There is no focal consolidation or pulmonary edema. There is no pleural effusion or pneumothorax There is no acute osseous abnormality. IMPRESSION: No radiographic evidence of acute cardiopulmonary process. Electronically Signed   By: Valetta Mole M.D.   On: 05/15/2022 18:19   CT Head Wo Contrast  Result Date: 05/15/2022 CLINICAL DATA:  Slurred speech facial droop multiple falls EXAM: CT HEAD WITHOUT CONTRAST CT CERVICAL SPINE WITHOUT CONTRAST TECHNIQUE: Multidetector CT imaging of the head and cervical spine was performed following the standard protocol without intravenous contrast. Multiplanar CT image reconstructions of the cervical spine were also generated. RADIATION DOSE REDUCTION: This exam was performed according to the departmental dose-optimization program which includes automated exposure control, adjustment of the mA and/or kV according to patient size and/or use of iterative reconstruction technique. COMPARISON:  None Available. FINDINGS: CT HEAD FINDINGS Brain: No evidence of acute infarction, hemorrhage, cerebral edema, mass, mass effect, or midline shift. No hydrocephalus or extra-axial fluid collection. Vascular: No hyperdense vessel. Skull: Normal. Negative for fracture or focal lesion. Sinuses/Orbits: Mild mucosal thickening in the maxillary sinuses. The orbits are unremarkable. Other: The mastoid air cells are well aerated. CT CERVICAL SPINE FINDINGS Alignment: Reversal of the normal cervical lordosis. No traumatic listhesis. Skull base and vertebrae: No acute fracture or suspicious osseous lesion. Soft tissues and spinal canal: No prevertebral fluid or swelling. No visible canal hematoma. Disc levels: No high-grade spinal canal stenosis or neural foraminal narrowing. Upper chest: No focal pulmonary opacity or pleural  effusion. Other: Evaluation of the neck soft tissues is limited by the absence of intravenous contrast. Within this limitation, abnormal soft tissue in the left parapharyngeal space, in the region of left level 2A lymph nodes, extending into the left paravertebral musculature, and an exophytic lesion posterior to the left ear are favored to be related to the patient's known throat cancer. IMPRESSION: 1.  No acute intracranial process. 2.  No acute fracture or traumatic listhesis in the cervical spine. 3. Evaluation of the neck soft tissues is limited by the absence of intravenous contrast. Within this limitation,  there are changes in the left neck that are likely related to the patient's throat cancer. Electronically Signed   By: Merilyn Baba M.D.   On: 05/15/2022 18:13   CT Cervical Spine Wo Contrast  Result Date: 05/15/2022 CLINICAL DATA:  Slurred speech facial droop multiple falls EXAM: CT HEAD WITHOUT CONTRAST CT CERVICAL SPINE WITHOUT CONTRAST TECHNIQUE: Multidetector CT imaging of the head and cervical spine was performed following the standard protocol without intravenous contrast. Multiplanar CT image reconstructions of the cervical spine were also generated. RADIATION DOSE REDUCTION: This exam was performed according to the departmental dose-optimization program which includes automated exposure control, adjustment of the mA and/or kV according to patient size and/or use of iterative reconstruction technique. COMPARISON:  None Available. FINDINGS: CT HEAD FINDINGS Brain: No evidence of acute infarction, hemorrhage, cerebral edema, mass, mass effect, or midline shift. No hydrocephalus or extra-axial fluid collection. Vascular: No hyperdense vessel. Skull: Normal. Negative for fracture or focal lesion. Sinuses/Orbits: Mild mucosal thickening in the maxillary sinuses. The orbits are unremarkable. Other: The mastoid air cells are well aerated. CT CERVICAL SPINE FINDINGS Alignment: Reversal of the normal  cervical lordosis. No traumatic listhesis. Skull base and vertebrae: No acute fracture or suspicious osseous lesion. Soft tissues and spinal canal: No prevertebral fluid or swelling. No visible canal hematoma. Disc levels: No high-grade spinal canal stenosis or neural foraminal narrowing. Upper chest: No focal pulmonary opacity or pleural effusion. Other: Evaluation of the neck soft tissues is limited by the absence of intravenous contrast. Within this limitation, abnormal soft tissue in the left parapharyngeal space, in the region of left level 2A lymph nodes, extending into the left paravertebral musculature, and an exophytic lesion posterior to the left ear are favored to be related to the patient's known throat cancer. IMPRESSION: 1.  No acute intracranial process. 2.  No acute fracture or traumatic listhesis in the cervical spine. 3. Evaluation of the neck soft tissues is limited by the absence of intravenous contrast. Within this limitation, there are changes in the left neck that are likely related to the patient's throat cancer. Electronically Signed   By: Merilyn Baba M.D.   On: 05/15/2022 18:13     Time coordinating discharge: Over 30 minutes    Dwyane Dee, MD  [facility] 05/19/2022, 5:25 PM

## 2022-05-19 NOTE — TOC CM/SW Note (Addendum)
Cab voucher cancelled.  Christina Porter, [name]

## 2022-05-19 NOTE — Progress Notes (Signed)
   05/19/22 0345  Assess: MEWS Score  Temp 98.6 F (37 C)  BP 104/67  MAP (mmHg) 78  Pulse Rate (!) 118  Resp 20  SpO2 98 %  O2 Device Room Air  Assess: MEWS Score  MEWS Temp 0  MEWS Systolic 0  MEWS Pulse 2  MEWS RR 0  MEWS LOC 0  MEWS Score 2  MEWS Score Color Yellow  Assess: if the MEWS score is Yellow or Red  Were vital signs taken at a resting state? Yes  Focused Assessment No change from prior assessment  Does the patient meet 2 or more of the SIRS criteria? Yes  Does the patient have a confirmed or suspected source of infection? Yes  Provider and Rapid Response Notified? No  MEWS guidelines implemented *See Row Information* Yes  Treat  MEWS Interventions Other (Comment) (continue to monitor)  Pain Scale 0-10  Pain Score Asleep  Take Vital Signs  Increase Vital Sign Frequency  Yellow: Q 2hr X 2 then Q 4hr X 2, if remains yellow, continue Q 4hrs  Escalate  MEWS: Escalate Yellow: discuss with charge nurse/RN and consider discussing with provider and RRT  Notify: Charge Nurse/RN  Name of Charge Nurse/RN Notified Jodie, RN  Date Charge Nurse/RN Notified 05/19/22  Time Charge Nurse/RN Notified [phone]  Document  Patient Outcome Other (Comment) (continue to monitor)  Progress note created (see row info) Yes  Assess: SIRS CRITERIA  SIRS Temperature  0  SIRS Pulse 1  SIRS Respirations  0  SIRS WBC 1  SIRS Score Sum  2

## 2022-05-19 NOTE — Progress Notes (Signed)
Pt discharge to home,  son is picking her up. VSS. Denies pain. Discharge paperwork discussed with patient. All questions/concerns addressed. All needs met, no further needs at this time.

## 2022-05-19 NOTE — Progress Notes (Signed)
Pt had 800cc output from her PEG Tube, and the drainage color is greenish. Pt refused her Osmolite and flush for her PEG Tube.

## 2022-05-21 LAB — CULTURE, BLOOD (ROUTINE X 2)
Culture: NO GROWTH
Special Requests: ADEQUATE

## 2022-06-02 ENCOUNTER — Emergency Department
Admission: EM | Admit: 2022-06-02 | Discharge: 2022-06-02 | Discharge status: Against Medical Advice | Payer: Medicaid Other | Attending: Emergency Medicine | Admitting: Emergency Medicine

## 2022-06-02 ENCOUNTER — Other Ambulatory Visit: Payer: Self-pay

## 2022-06-02 DIAGNOSIS — R079 Chest pain, unspecified: Secondary | ICD-10-CM | POA: Diagnosis not present

## 2022-06-02 DIAGNOSIS — Z5321 Procedure and treatment not carried out due to patient leaving prior to being seen by health care provider: Secondary | ICD-10-CM | POA: Diagnosis not present

## 2022-06-02 NOTE — ED Triage Notes (Signed)
To triage via wheelchair with c/o port issues. Pt has single lumen Port a cath accessed to left chest that was accessed at apx 630pm tonight. She states that PCP called her today afterwards to tell her it needed to be flushed with Heparin

## 2022-06-03 ENCOUNTER — Inpatient Hospital Stay
Admission: EM | Admit: 2022-06-03 | Discharge: 2022-06-20 | DRG: 011 | Disposition: A | Discharge status: Hospice | Payer: Medicaid Other | Attending: Internal Medicine | Admitting: Internal Medicine

## 2022-06-03 ENCOUNTER — Emergency Department: Payer: Medicaid Other

## 2022-06-03 ENCOUNTER — Encounter: Payer: Self-pay | Admitting: Emergency Medicine

## 2022-06-03 DIAGNOSIS — Z888 Allergy status to other drugs, medicaments and biological substances status: Secondary | ICD-10-CM

## 2022-06-03 DIAGNOSIS — E43 Unspecified severe protein-calorie malnutrition: Secondary | ICD-10-CM | POA: Insufficient documentation

## 2022-06-03 DIAGNOSIS — Z931 Gastrostomy status: Secondary | ICD-10-CM

## 2022-06-03 DIAGNOSIS — R636 Underweight: Secondary | ICD-10-CM

## 2022-06-03 DIAGNOSIS — R490 Dysphonia: Secondary | ICD-10-CM | POA: Diagnosis present

## 2022-06-03 DIAGNOSIS — E860 Dehydration: Secondary | ICD-10-CM | POA: Diagnosis present

## 2022-06-03 DIAGNOSIS — Z79899 Other long term (current) drug therapy: Secondary | ICD-10-CM

## 2022-06-03 DIAGNOSIS — R042 Hemoptysis: Secondary | ICD-10-CM

## 2022-06-03 DIAGNOSIS — Z66 Do not resuscitate: Secondary | ICD-10-CM | POA: Diagnosis present

## 2022-06-03 DIAGNOSIS — L8990 Pressure ulcer of unspecified site, unspecified stage: Secondary | ICD-10-CM | POA: Insufficient documentation

## 2022-06-03 DIAGNOSIS — R6510 Systemic inflammatory response syndrome (SIRS) of non-infectious origin without acute organ dysfunction: Secondary | ICD-10-CM

## 2022-06-03 DIAGNOSIS — F319 Bipolar disorder, unspecified: Secondary | ICD-10-CM | POA: Diagnosis present

## 2022-06-03 DIAGNOSIS — Z720 Tobacco use: Secondary | ICD-10-CM | POA: Diagnosis present

## 2022-06-03 DIAGNOSIS — J449 Chronic obstructive pulmonary disease, unspecified: Secondary | ICD-10-CM | POA: Diagnosis present

## 2022-06-03 DIAGNOSIS — R1310 Dysphagia, unspecified: Secondary | ICD-10-CM | POA: Diagnosis present

## 2022-06-03 DIAGNOSIS — E46 Unspecified protein-calorie malnutrition: Secondary | ICD-10-CM

## 2022-06-03 DIAGNOSIS — C051 Malignant neoplasm of soft palate: Secondary | ICD-10-CM | POA: Diagnosis present

## 2022-06-03 DIAGNOSIS — D62 Acute posthemorrhagic anemia: Secondary | ICD-10-CM | POA: Diagnosis not present

## 2022-06-03 DIAGNOSIS — R58 Hemorrhage, not elsewhere classified: Secondary | ICD-10-CM | POA: Diagnosis not present

## 2022-06-03 DIAGNOSIS — E876 Hypokalemia: Secondary | ICD-10-CM | POA: Diagnosis present

## 2022-06-03 DIAGNOSIS — C779 Secondary and unspecified malignant neoplasm of lymph node, unspecified: Secondary | ICD-10-CM | POA: Diagnosis present

## 2022-06-03 DIAGNOSIS — I10 Essential (primary) hypertension: Secondary | ICD-10-CM | POA: Diagnosis present

## 2022-06-03 DIAGNOSIS — R8290 Unspecified abnormal findings in urine: Secondary | ICD-10-CM

## 2022-06-03 DIAGNOSIS — N6320 Unspecified lump in the left breast, unspecified quadrant: Secondary | ICD-10-CM | POA: Diagnosis present

## 2022-06-03 DIAGNOSIS — G40909 Epilepsy, unspecified, not intractable, without status epilepticus: Secondary | ICD-10-CM

## 2022-06-03 DIAGNOSIS — C140 Malignant neoplasm of pharynx, unspecified: Secondary | ICD-10-CM | POA: Diagnosis present

## 2022-06-03 DIAGNOSIS — Z515 Encounter for palliative care: Secondary | ICD-10-CM

## 2022-06-03 DIAGNOSIS — L89152 Pressure ulcer of sacral region, stage 2: Secondary | ICD-10-CM | POA: Diagnosis present

## 2022-06-03 DIAGNOSIS — E871 Hypo-osmolality and hyponatremia: Secondary | ICD-10-CM

## 2022-06-03 DIAGNOSIS — Z79891 Long term (current) use of opiate analgesic: Secondary | ICD-10-CM

## 2022-06-03 DIAGNOSIS — I82C12 Acute embolism and thrombosis of left internal jugular vein: Secondary | ICD-10-CM | POA: Diagnosis present

## 2022-06-03 DIAGNOSIS — R4182 Altered mental status, unspecified: Secondary | ICD-10-CM

## 2022-06-03 DIAGNOSIS — G893 Neoplasm related pain (acute) (chronic): Secondary | ICD-10-CM | POA: Diagnosis present

## 2022-06-03 DIAGNOSIS — Z86718 Personal history of other venous thrombosis and embolism: Secondary | ICD-10-CM

## 2022-06-03 DIAGNOSIS — F17210 Nicotine dependence, cigarettes, uncomplicated: Secondary | ICD-10-CM | POA: Diagnosis present

## 2022-06-03 DIAGNOSIS — Z20822 Contact with and (suspected) exposure to covid-19: Secondary | ICD-10-CM | POA: Diagnosis present

## 2022-06-03 DIAGNOSIS — J9601 Acute respiratory failure with hypoxia: Secondary | ICD-10-CM | POA: Diagnosis present

## 2022-06-03 DIAGNOSIS — C109 Malignant neoplasm of oropharynx, unspecified: Principal | ICD-10-CM | POA: Diagnosis present

## 2022-06-03 DIAGNOSIS — J690 Pneumonitis due to inhalation of food and vomit: Secondary | ICD-10-CM | POA: Diagnosis present

## 2022-06-03 DIAGNOSIS — G510 Bell's palsy: Secondary | ICD-10-CM | POA: Diagnosis present

## 2022-06-03 DIAGNOSIS — I959 Hypotension, unspecified: Secondary | ICD-10-CM | POA: Diagnosis present

## 2022-06-03 DIAGNOSIS — C760 Malignant neoplasm of head, face and neck: Secondary | ICD-10-CM

## 2022-06-03 DIAGNOSIS — Z681 Body mass index (BMI) 19 or less, adult: Secondary | ICD-10-CM

## 2022-06-03 DIAGNOSIS — R64 Cachexia: Secondary | ICD-10-CM | POA: Diagnosis present

## 2022-06-03 DIAGNOSIS — D649 Anemia, unspecified: Secondary | ICD-10-CM | POA: Diagnosis present

## 2022-06-03 LAB — BLOOD GAS, VENOUS
Acid-Base Excess: 10.3 mmol/L — ABNORMAL HIGH (ref 0.0–2.0)
Bicarbonate: 35.1 mmol/L — ABNORMAL HIGH (ref 20.0–28.0)
O2 Saturation: 91.5 %
Patient temperature: 37
pCO2, Ven: 46 mmHg (ref 44–60)
pH, Ven: 7.49 — ABNORMAL HIGH (ref 7.25–7.43)
pO2, Ven: 56 mmHg — ABNORMAL HIGH (ref 32–45)

## 2022-06-03 LAB — COMPREHENSIVE METABOLIC PANEL
ALT: 9 U/L (ref 0–44)
AST: 18 U/L (ref 15–41)
Albumin: 2.5 g/dL — ABNORMAL LOW (ref 3.5–5.0)
Alkaline Phosphatase: 106 U/L (ref 38–126)
Anion gap: 12 (ref 5–15)
BUN: 6 mg/dL — ABNORMAL LOW (ref 8–23)
CO2: 26 mmol/L (ref 22–32)
Calcium: 8.6 mg/dL — ABNORMAL LOW (ref 8.9–10.3)
Chloride: 95 mmol/L — ABNORMAL LOW (ref 98–111)
Creatinine, Ser: 0.51 mg/dL (ref 0.44–1.00)
GFR, Estimated: >60 mL/min (ref >60)
Glucose, Bld: 111 mg/dL — ABNORMAL HIGH (ref 70–99)
Potassium: 3.2 mmol/L — ABNORMAL LOW (ref 3.5–5.1)
Sodium: 133 mmol/L — ABNORMAL LOW (ref 135–145)
Total Bilirubin: 0.5 mg/dL (ref 0.3–1.2)
Total Protein: 6.1 g/dL — ABNORMAL LOW (ref 6.5–8.1)

## 2022-06-03 LAB — PROCALCITONIN: Procalcitonin: 1.13 ng/mL

## 2022-06-03 LAB — LACTIC ACID, PLASMA: Lactic Acid, Venous: 0.7 mmol/L (ref 0.5–1.9)

## 2022-06-03 MED ORDER — POTASSIUM CHLORIDE 20 MEQ PO PACK
40.0000 meq | PACK | Freq: Two times a day (BID) | ORAL | Status: DC
  Administered 2022-06-04 – 2022-06-05 (×4): 40 meq
  Filled 2022-06-03 (×5): qty 2

## 2022-06-03 MED ORDER — LACTATED RINGERS IV BOLUS
1000.0000 mL | Freq: Once | INTRAVENOUS | Status: AC
  Administered 2022-06-03: 1000 mL via INTRAVENOUS

## 2022-06-03 MED ORDER — POTASSIUM CHLORIDE CRYS ER 20 MEQ PO TBCR
40.0000 meq | EXTENDED_RELEASE_TABLET | Freq: Once | ORAL | Status: DC
Start: 2022-06-03 — End: 2022-06-03
  Filled 2022-06-03: qty 2

## 2022-06-03 MED ORDER — IOHEXOL 350 MG/ML SOLN
50.0000 mL | Freq: Once | INTRAVENOUS | Status: AC | PRN
  Administered 2022-06-03: 50 mL via INTRAVENOUS

## 2022-06-03 MED ORDER — HEPARIN SOD (PORK) LOCK FLUSH 10 UNIT/ML IV SOLN
10.0000 [IU] | Freq: Once | INTRAVENOUS | Status: DC
  Filled 2022-06-03: qty 1

## 2022-06-03 MED ORDER — POTASSIUM CHLORIDE 10 MEQ/100ML IV SOLN
10.0000 meq | Freq: Once | INTRAVENOUS | Status: AC
  Administered 2022-06-03: 10 meq via INTRAVENOUS
  Filled 2022-06-03: qty 100

## 2022-06-03 NOTE — ED Provider Notes (Addendum)
Medical screening examination/treatment/procedure(s) were conducted as a shared visit with non-physician practitioner(s) and myself.  I personally evaluated the patient during the encounter.   Asked to see patient by nursing staff as patient wanted to have her port accessed flushed.  However after discussing with the patient and noting that nursing staff had discussed with her IV team who recommended it be deaccessed.  I discussed with the patient patient indicates that she is agreeable with having the port deaccessed.  Does not appear that she is having any medications infused to it, but rather had it accessed in a previous ED visit at [facility] in possibly left the hospital with a still accessed  In addition, she is noted to be tachycardic and slightly tachypneic and had a recent visit to [facility].  Discussed with PA Ashok Cordia, and we will initiate further medical work-up as well evaluation for possible sepsis, ongoing pneumonia infection etc. are all considered in her differential though the patient's presentation she reports is here to have access discontinued or flushed, but my concern is that she may certainly have other condition such as sepsis ongoing based off review of [name] notes.   Delman Kitten, MD 06/03/22 2100  Discussed case with Dr. Hulan Saas, oncoming attending.  He will see and evaluate the patient and is aware of the concerns of a possible ongoing issue such as sepsis    Delman Kitten, MD 06/03/22 2102

## 2022-06-03 NOTE — ED Triage Notes (Addendum)
Patient to ED for port to be flushed. Patient states she left AMA yesterday due to wait time. Patient's port is still accessed at this time and needs to be flushed with heparin. Patient also complaining of pain in head and ear. Patient not wanting to be seen for anything other than the port being flushed at this time.

## 2022-06-03 NOTE — ED Provider Triage Note (Signed)
Emergency Medicine Provider Triage Evaluation Note  Christina Porter , a 61 y.o. female  was evaluated in triage.  Pt complains of "I need my port flushed." Her port is still accessed. She does not want anything else done today. Denies any SOB/CP/fevers.   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 Left AMA with port in place.   Unwilling to have any other tests done today.   Review of Systems  Positive: Port needs flushing Negative: CP/SOB  Physical Exam  There were no vitals taken for this visit. Gen:   Awake, no distress   Resp:  Normal effort  MSK:   Moves extremities without difficulty  Other:    Medical Decision Making  Medically screening exam initiated at 5:25 PM.  Appropriate orders placed.  Christina Porter was informed that the remainder of the evaluation will be completed by another provider, this initial triage assessment does not replace that evaluation, and the importance of remaining in the ED until their evaluation is complete.     Marquette Old, PA-C 06/03/22 1731

## 2022-06-03 NOTE — ED Provider Notes (Signed)
[facility] Provider Note    Event Date/Time   First MD Initiated Contact with Patient 06/03/22 2059     (approximate)   History   Vascular Access Problem   HPI  Christina Porter is a 61 y.o. female  with PMH soft palate SCC (follows with Dr. Harriette Ohara, [facility] getting radiation), asthma, seizures, bipolar disorder, tobacco use, chronic pain who presents requesting that her right Port-A-Cath be "deaccessed" as she had left the hospital yesterday before this was properly done.  Patient is denying any new shortness of breath, cough, vomiting, diarrhea, burning with urination, abdominal pain headache or falls.  However further history is limited from her that she is not oriented stating it is October and she is not sure where she is.  She is also not oriented to year on my initial assessment.  I was able to speak with her son and her [place] who felt quite frustrated with the care she received at [facility] and states she is only here after her port properly deaccessed.  Attempted to have goals of care discussion to determine if patient was comfort care only and if he wished for [place] to defer any additional diagnostic studies or evaluation to look for other causes for patient's tachycardia on arrival he states he does want this to be done.   Of note patient was evaluated in family medicine office yesterday where she was noted to be hypoxic and orthopneic and reported several days of chest congestion and shortness of breath and occasional hemoptysis and was referred to the emergency room for further evaluation.  In the emergency room patient was noted be hypoxic and had a CTA chest obtained that did not show PE but patient apparently left AMA.  CTA chest from yesterday ED visit remarkable for: "No evidence of acute pulmonary embolus. Trace fluid in the distal esophagus which can be seen with gastroesophageal reflux. Clinical correlation is recommended. Subcentimeter  soft tissue nodule in the left breast, indeterminate. "     Physical Exam  Triage Vital Signs: ED Triage Vitals  Enc Vitals Group     BP 06/03/22 1726 95/61     Pulse Rate 06/03/22 1726 (!) 116     Resp 06/03/22 1726 18     Temp 06/03/22 1726 99 F (37.2 C)     Temp Source 06/03/22 1726 Oral     SpO2 06/03/22 1726 98 %     Weight 06/03/22 1728 92 lb 9.5 oz (42 kg)     Height 06/03/22 1728 '5\' 2"'$  (1.575 m)     Head Circumference --      Peak Flow --      Pain Score 06/03/22 1728 10     Pain Loc --      Pain Edu? --      Excl. in [place]? --     Most recent vital signs: Vitals:   06/03/22 2044 06/03/22 2119  BP: (!) 133/91   Pulse: (!) 124   Resp: (!) 21   Temp:  99.8 F (37.7 C)  SpO2: 98%     General: Awake, no distress.  CV:  Audible murmur at this time.  2+ radial pulse.  Tachycardic.  Prolonged cap refill in the digits Resp:  Normal effort.  Mild rhonchi Abd:  No distention.  Soft Other:  Tube is in place without purulent drainage or tenderness or bleeding.  Her abdomen is soft.  Patient is able to follow commands in all  extremities.  She does not appear volume overloaded.  She is a large deformity and mass over the left jaw and neck.  He is in very mild rhonchi.  No wheezing or rales.  She is not oriented.   ED Results / Procedures / Treatments  Labs (all labs ordered are listed, but only abnormal results are displayed) Labs Reviewed  COMPREHENSIVE METABOLIC PANEL - Abnormal; Notable for the following components:      Result Value   Sodium 133 (*)    Potassium 3.2 (*)    Chloride 95 (*)    Glucose, Bld 111 (*)    BUN 6 (*)    Calcium 8.6 (*)    Total Protein 6.1 (*)    Albumin 2.5 (*)    All other components within normal limits  BLOOD GAS, VENOUS - Abnormal; Notable for the following components:   pH, Ven 7.49 (*)    pO2, Ven 56 (*)    Bicarbonate 35.1 (*)    Acid-Base Excess 10.3 (*)    All other components within normal limits  CULTURE, BLOOD  (ROUTINE X 2)  CULTURE, BLOOD (ROUTINE X 2)  URINE CULTURE  SARS CORONAVIRUS 2 BY RT PCR  LACTIC ACID, PLASMA  PROCALCITONIN  LACTIC ACID, PLASMA  CBC WITH DIFFERENTIAL/PLATELET  URINALYSIS, COMPLETE (UACMP) WITH MICROSCOPIC  PROTIME-INR  APTT  CBC WITH DIFFERENTIAL/PLATELET     EKG   RADIOLOGY CT head on my interpretation without evidence of mass, hemorrhage edema or mass effect.  I reviewed radiology interpretation and agree to findings of no acute intracranial process and a left retroauricular soft tissue mass partially seen and better visualized on CT of the neck from 7/17.  Chest reviewed by myself shows no focal consoidation, effusion, edema, pneumothorax or other clear acute thoracic process. I also reviewed radiology interpretation and agree with findings described.   PROCEDURES:  Critical Care performed: Yes, see critical care procedure note(s)  .Critical Care  Performed by: Lucrezia Starch, MD Authorized by: Lucrezia Starch, MD   Critical care provider statement:    Critical care time (minutes):  30   Critical care was necessary to treat or prevent imminent or life-threatening deterioration of the following conditions:  Sepsis and CNS failure or compromise   Critical care was time spent personally by me on the following activities:  Development of treatment plan with patient or surrogate, discussions with consultants, evaluation of patient's response to treatment, examination of patient, ordering and review of laboratory studies, ordering and review of radiographic studies, ordering and performing treatments and interventions, pulse oximetry, re-evaluation of patient's condition and review of old [place] ED: Medications  heparin flush 10 UNIT/ML injection 10 Units (10 Units Intracatheter Not Given 06/03/22 2022)  potassium chloride 10 mEq in 100 mL IVPB (10 mEq Intravenous New Bag/Given 06/03/22 2324)  potassium chloride (KLOR-CON) packet 40  mEq (has no administration in time range)  iohexol (OMNIPAQUE) 350 MG/ML injection 50 mL (has no administration in time range)  lactated ringers bolus 1,000 mL (1,000 mLs Intravenous New Bag/Given 06/03/22 2325)     IMPRESSION / MDM / [place] / ED COURSE  I reviewed the triage vital signs and the nursing notes. Patient's presentation is most consistent with acute presentation with potential threat to life or bodily function.  Differential diagnosis includes, but is not limited to hypercarbic respiratory failure, intracranial hemorrhage or brain metastatic lesion, PE, anemia, arrhythmia, dehydration and metabolic derangements, CHF, pneumonia and sepsis versus other infectious etiology such as UTI.  I was able to speak with her son and her [place] who felt quite frustrated with the care she received at [facility] and states she is only here after her port properly deaccessed.  Attempted to have goals of care discussion to determine if patient was comfort care only and if he wished for [place] to defer any additional diagnostic studies or evaluation to look for other causes for patient's tachycardia on arrival he states he does want this to be done.  Seems there is a lot of confusion on patient's current goals of care I think patient would likely benefit from palliative consult.  Of note patient was evaluated in family medicine office yesterday where she was noted to be hypoxic and orthopneic and reported several days of chest congestion and shortness of breath and occasional hemoptysis and was referred to the emergency room for further evaluation.  In the emergency room patient was noted be hypoxic and had a CTA chest obtained that did not show PE but patient apparently left AMA.  CTA chest from yesterday ED visit remarkable for: "No evidence of acute pulmonary embolus. Trace fluid in the distal esophagus which can be seen with gastroesophageal reflux.  Clinical correlation is recommended. Subcentimeter soft tissue nodule in the left breast, indeterminate. "  Given this was obtained less than 24 hours prior to arrival and patient SPO2 is 98% today I have a lower suspicion for PE and do not believe we need to reCT her chest..  CT head on my interpretation without evidence of mass, hemorrhage edema or mass effect.  I reviewed radiology interpretation and agree to findings of no acute intracranial process and a left retroauricular soft tissue mass partially seen and better visualized on CT of the neck from 7/17.  Chest reviewed by myself shows no focal consoidation, effusion, edema, pneumothorax or other clear acute thoracic process. I also reviewed radiology interpretation and agree with findings described.  Her PEG tube insertion site does not appear infected and her belly is soft and Evalose patient for acute intra-abdominal pathology.  VBG with a pH of 7.49 with a PCO2 of 46 and bicarb of 35.1 not suggestive of acute hypercarbic respiratory failure.  CMP is remarkable for K of 3.2 without other significant electrolyte or metabolic derangement.  Sodium is slightly low at 133.  Lactic acid is nonelevated 0.7  Care patient signed over to sending provider approximately 2330 with plan to follow-up remaining labs EKG as well as CT neck and likely admit for minimum dehydration and palliative consult.   FINAL CLINICAL IMPRESSION(S) / ED DIAGNOSES   Final diagnoses:  SIRS (systemic inflammatory response syndrome) (HCC)  Hypokalemia  Dehydration  Altered mental status, unspecified altered mental status type     Rx / DC Orders   ED Discharge Orders     None        Note:  This document was prepared using Dragon voice recognition software and may include unintentional dictation errors.   Lucrezia Starch, MD 06/03/22 [phone]

## 2022-06-03 NOTE — ED Notes (Signed)
Pt to Ct at this time.

## 2022-06-03 NOTE — ED Triage Notes (Signed)
First Nurse Note:  Pt via EMS from home. Pt and LWBS last night and states that she didn't want to wait. Pt needs her port flushed out. Pt is A&OX4 and NAD.

## 2022-06-04 ENCOUNTER — Encounter: Payer: Self-pay | Admitting: Internal Medicine

## 2022-06-04 DIAGNOSIS — L89152 Pressure ulcer of sacral region, stage 2: Secondary | ICD-10-CM | POA: Diagnosis present

## 2022-06-04 DIAGNOSIS — R6510 Systemic inflammatory response syndrome (SIRS) of non-infectious origin without acute organ dysfunction: Secondary | ICD-10-CM | POA: Diagnosis present

## 2022-06-04 DIAGNOSIS — E860 Dehydration: Secondary | ICD-10-CM | POA: Diagnosis present

## 2022-06-04 DIAGNOSIS — C051 Malignant neoplasm of soft palate: Secondary | ICD-10-CM | POA: Diagnosis present

## 2022-06-04 DIAGNOSIS — I10 Essential (primary) hypertension: Secondary | ICD-10-CM | POA: Diagnosis present

## 2022-06-04 DIAGNOSIS — Z681 Body mass index (BMI) 19 or less, adult: Secondary | ICD-10-CM | POA: Diagnosis not present

## 2022-06-04 DIAGNOSIS — R636 Underweight: Secondary | ICD-10-CM | POA: Diagnosis not present

## 2022-06-04 DIAGNOSIS — G893 Neoplasm related pain (acute) (chronic): Secondary | ICD-10-CM | POA: Diagnosis present

## 2022-06-04 DIAGNOSIS — R042 Hemoptysis: Secondary | ICD-10-CM | POA: Diagnosis present

## 2022-06-04 DIAGNOSIS — J690 Pneumonitis due to inhalation of food and vomit: Secondary | ICD-10-CM | POA: Diagnosis present

## 2022-06-04 DIAGNOSIS — C140 Malignant neoplasm of pharynx, unspecified: Secondary | ICD-10-CM | POA: Diagnosis not present

## 2022-06-04 DIAGNOSIS — Z20822 Contact with and (suspected) exposure to covid-19: Secondary | ICD-10-CM | POA: Diagnosis present

## 2022-06-04 DIAGNOSIS — Z7189 Other specified counseling: Secondary | ICD-10-CM | POA: Diagnosis not present

## 2022-06-04 DIAGNOSIS — E43 Unspecified severe protein-calorie malnutrition: Secondary | ICD-10-CM | POA: Diagnosis present

## 2022-06-04 DIAGNOSIS — Z66 Do not resuscitate: Secondary | ICD-10-CM | POA: Diagnosis present

## 2022-06-04 DIAGNOSIS — Z931 Gastrostomy status: Secondary | ICD-10-CM | POA: Diagnosis not present

## 2022-06-04 DIAGNOSIS — D649 Anemia, unspecified: Secondary | ICD-10-CM | POA: Diagnosis present

## 2022-06-04 DIAGNOSIS — C760 Malignant neoplasm of head, face and neck: Secondary | ICD-10-CM | POA: Diagnosis not present

## 2022-06-04 DIAGNOSIS — G40909 Epilepsy, unspecified, not intractable, without status epilepticus: Secondary | ICD-10-CM | POA: Diagnosis present

## 2022-06-04 DIAGNOSIS — C109 Malignant neoplasm of oropharynx, unspecified: Secondary | ICD-10-CM | POA: Diagnosis present

## 2022-06-04 DIAGNOSIS — J9601 Acute respiratory failure with hypoxia: Secondary | ICD-10-CM | POA: Diagnosis present

## 2022-06-04 DIAGNOSIS — C779 Secondary and unspecified malignant neoplasm of lymph node, unspecified: Secondary | ICD-10-CM | POA: Diagnosis present

## 2022-06-04 DIAGNOSIS — R64 Cachexia: Secondary | ICD-10-CM | POA: Diagnosis present

## 2022-06-04 DIAGNOSIS — R000 Tachycardia, unspecified: Secondary | ICD-10-CM | POA: Diagnosis not present

## 2022-06-04 DIAGNOSIS — F319 Bipolar disorder, unspecified: Secondary | ICD-10-CM | POA: Diagnosis present

## 2022-06-04 DIAGNOSIS — E871 Hypo-osmolality and hyponatremia: Secondary | ICD-10-CM

## 2022-06-04 DIAGNOSIS — J449 Chronic obstructive pulmonary disease, unspecified: Secondary | ICD-10-CM | POA: Diagnosis present

## 2022-06-04 DIAGNOSIS — Z515 Encounter for palliative care: Secondary | ICD-10-CM | POA: Diagnosis not present

## 2022-06-04 DIAGNOSIS — R8290 Unspecified abnormal findings in urine: Secondary | ICD-10-CM

## 2022-06-04 DIAGNOSIS — R4182 Altered mental status, unspecified: Secondary | ICD-10-CM | POA: Diagnosis not present

## 2022-06-04 DIAGNOSIS — I82C12 Acute embolism and thrombosis of left internal jugular vein: Secondary | ICD-10-CM | POA: Diagnosis present

## 2022-06-04 DIAGNOSIS — D62 Acute posthemorrhagic anemia: Secondary | ICD-10-CM | POA: Diagnosis not present

## 2022-06-04 DIAGNOSIS — N6320 Unspecified lump in the left breast, unspecified quadrant: Secondary | ICD-10-CM | POA: Diagnosis present

## 2022-06-04 LAB — CBC WITH DIFFERENTIAL/PLATELET
Abs Immature Granulocytes: 0.14 10*3/uL — ABNORMAL HIGH (ref 0.00–0.07)
Basophils Absolute: 0 10*3/uL (ref 0.0–0.1)
Basophils Relative: 0 %
Eosinophils Absolute: 0 10*3/uL (ref 0.0–0.5)
Eosinophils Relative: 0 %
HCT: 29.9 % — ABNORMAL LOW (ref 36.0–46.0)
Hemoglobin: 9.8 g/dL — ABNORMAL LOW (ref 12.0–15.0)
Immature Granulocytes: 1 %
Lymphocytes Relative: 4 %
Lymphs Abs: 0.7 10*3/uL (ref 0.7–4.0)
MCH: 31.6 pg (ref 26.0–34.0)
MCHC: 32.8 g/dL (ref 30.0–36.0)
MCV: 96.5 fL (ref 80.0–100.0)
Monocytes Absolute: 1.8 10*3/uL — ABNORMAL HIGH (ref 0.1–1.0)
Monocytes Relative: 9 %
Neutro Abs: 17.5 10*3/uL — ABNORMAL HIGH (ref 1.7–7.7)
Neutrophils Relative %: 86 %
Platelets: 518 10*3/uL — ABNORMAL HIGH (ref 150–400)
RBC: 3.1 MIL/uL — ABNORMAL LOW (ref 3.87–5.11)
RDW: 13.9 % (ref 11.5–15.5)
WBC: 20.3 10*3/uL — ABNORMAL HIGH (ref 4.0–10.5)
nRBC: 0 % (ref 0.0–0.2)

## 2022-06-04 LAB — CBC
HCT: 35.8 % — ABNORMAL LOW (ref 36.0–46.0)
Hemoglobin: 11.9 g/dL — ABNORMAL LOW (ref 12.0–15.0)
MCH: 31.9 pg (ref 26.0–34.0)
MCHC: 33.2 g/dL (ref 30.0–36.0)
MCV: 96 fL (ref 80.0–100.0)
Platelets: 471 10*3/uL — ABNORMAL HIGH (ref 150–400)
RBC: 3.73 MIL/uL — ABNORMAL LOW (ref 3.87–5.11)
RDW: 13.9 % (ref 11.5–15.5)
WBC: 19.7 10*3/uL — ABNORMAL HIGH (ref 4.0–10.5)
nRBC: 0 % (ref 0.0–0.2)

## 2022-06-04 LAB — URINALYSIS, COMPLETE (UACMP) WITH MICROSCOPIC
Bilirubin Urine: NEGATIVE
Glucose, UA: NEGATIVE mg/dL
Hgb urine dipstick: NEGATIVE
Ketones, ur: NEGATIVE mg/dL
Nitrite: NEGATIVE
Protein, ur: NEGATIVE mg/dL
Specific Gravity, Urine: 1.023 (ref 1.005–1.030)
pH: 8 (ref 5.0–8.0)

## 2022-06-04 LAB — BASIC METABOLIC PANEL
Anion gap: 13 (ref 5–15)
BUN: <5 mg/dL — ABNORMAL LOW (ref 8–23)
CO2: 27 mmol/L (ref 22–32)
Calcium: 9.3 mg/dL (ref 8.9–10.3)
Chloride: 95 mmol/L — ABNORMAL LOW (ref 98–111)
Creatinine, Ser: 0.53 mg/dL (ref 0.44–1.00)
GFR, Estimated: >60 mL/min (ref >60)
Glucose, Bld: 113 mg/dL — ABNORMAL HIGH (ref 70–99)
Potassium: 2.9 mmol/L — ABNORMAL LOW (ref 3.5–5.1)
Sodium: 135 mmol/L (ref 135–145)

## 2022-06-04 LAB — APTT: aPTT: 34 seconds (ref 24–36)

## 2022-06-04 LAB — PROCALCITONIN: Procalcitonin: 0.91 ng/mL

## 2022-06-04 LAB — CORTISOL-AM, BLOOD: Cortisol - AM: 28 ug/dL — ABNORMAL HIGH (ref 6.7–22.6)

## 2022-06-04 LAB — PROTIME-INR
INR: 1.1 (ref 0.8–1.2)
Prothrombin Time: 13.9 seconds (ref 11.4–15.2)

## 2022-06-04 LAB — SARS CORONAVIRUS 2 BY RT PCR: SARS Coronavirus 2 by RT PCR: NEGATIVE

## 2022-06-04 LAB — LACTIC ACID, PLASMA: Lactic Acid, Venous: 0.8 mmol/L (ref 0.5–1.9)

## 2022-06-04 MED ORDER — OXYCODONE HCL 5 MG/5ML PO SOLN
5.0000 mg | ORAL | Status: DC | PRN
  Administered 2022-06-04 – 2022-06-10 (×5): 5 mg
  Filled 2022-06-04 (×5): qty 5

## 2022-06-04 MED ORDER — TRAZODONE HCL 100 MG PO TABS
100.0000 mg | ORAL_TABLET | Freq: Every day | ORAL | Status: DC
  Administered 2022-06-04 – 2022-06-19 (×16): 100 mg
  Filled 2022-06-04 (×16): qty 1

## 2022-06-04 MED ORDER — QUETIAPINE FUMARATE 25 MG PO TABS: 100.0000 mg | ORAL_TABLET | Freq: Every day | ORAL | Status: DC

## 2022-06-04 MED ORDER — SODIUM CHLORIDE 0.9 % IV SOLN: 1.0000 g | Freq: Once | INTRAVENOUS | Status: DC

## 2022-06-04 MED ORDER — MORPHINE SULFATE 10 MG/5ML PO SOLN
10.0000 mg | Freq: Four times a day (QID) | ORAL | Status: DC
  Administered 2022-06-04 – 2022-06-08 (×11): 10 mg
  Filled 2022-06-04 (×11): qty 5

## 2022-06-04 MED ORDER — ACETAMINOPHEN 160 MG/5ML PO SOLN
650.0000 mg | Freq: Four times a day (QID) | ORAL | Status: DC | PRN
  Administered 2022-06-08: 650 mg
  Filled 2022-06-04 (×2): qty 20.3

## 2022-06-04 MED ORDER — GABAPENTIN 250 MG/5ML PO SOLN
600.0000 mg | Freq: Three times a day (TID) | ORAL | Status: DC
  Administered 2022-06-04 – 2022-06-20 (×48): 600 mg
  Filled 2022-06-04 (×57): qty 12

## 2022-06-04 MED ORDER — ONDANSETRON HCL 4 MG PO TABS: 4.0000 mg | ORAL_TABLET | Freq: Four times a day (QID) | ORAL | Status: DC | PRN

## 2022-06-04 MED ORDER — ENOXAPARIN SODIUM 40 MG/0.4ML IJ SOSY
40.0000 mg | PREFILLED_SYRINGE | INTRAMUSCULAR | Status: DC
  Administered 2022-06-04 – 2022-06-05 (×2): 40 mg via SUBCUTANEOUS
  Filled 2022-06-04 (×2): qty 0.4

## 2022-06-04 MED ORDER — TRAZODONE HCL 100 MG PO TABS: 100.0000 mg | ORAL_TABLET | Freq: Every day | ORAL | Status: DC

## 2022-06-04 MED ORDER — POTASSIUM CHLORIDE 20 MEQ PO PACK: 40.0000 meq | PACK | Freq: Once | ORAL | Status: DC

## 2022-06-04 MED ORDER — OXYCODONE HCL 5 MG PO TABS: 5.0000 mg | ORAL_TABLET | ORAL | Status: DC | PRN

## 2022-06-04 MED ORDER — QUETIAPINE FUMARATE 25 MG PO TABS
100.0000 mg | ORAL_TABLET | Freq: Every day | ORAL | Status: DC
  Administered 2022-06-04 – 2022-06-19 (×17): 100 mg
  Filled 2022-06-04 (×5): qty 4
  Filled 2022-06-04 (×2): qty 1
  Filled 2022-06-04 (×3): qty 4
  Filled 2022-06-04 (×2): qty 1
  Filled 2022-06-04: qty 4
  Filled 2022-06-04: qty 1
  Filled 2022-06-04 (×3): qty 4

## 2022-06-04 MED ORDER — ACETAMINOPHEN 650 MG RE SUPP: 650.0000 mg | Freq: Four times a day (QID) | RECTAL | Status: DC | PRN

## 2022-06-04 MED ORDER — ACETAMINOPHEN 325 MG PO TABS
650.0000 mg | ORAL_TABLET | Freq: Four times a day (QID) | ORAL | Status: DC | PRN
  Administered 2022-06-06 – 2022-06-13 (×7): 650 mg via ORAL
  Filled 2022-06-04 (×8): qty 2

## 2022-06-04 MED ORDER — LACTATED RINGERS IV SOLN: INTRAVENOUS | Status: AC

## 2022-06-04 MED ORDER — GABAPENTIN 300 MG PO CAPS: 600.0000 mg | ORAL_CAPSULE | Freq: Three times a day (TID) | ORAL | Status: DC

## 2022-06-04 MED ORDER — SODIUM CHLORIDE 0.9 % IV SOLN
2.0000 g | INTRAVENOUS | Status: DC
  Administered 2022-06-04 – 2022-06-05 (×2): 2 g via INTRAVENOUS
  Filled 2022-06-04 (×2): qty 2

## 2022-06-04 MED ORDER — ONDANSETRON HCL 4 MG/2ML IJ SOLN
4.0000 mg | Freq: Four times a day (QID) | INTRAMUSCULAR | Status: DC | PRN
  Administered 2022-06-08 – 2022-06-09 (×2): 4 mg via INTRAVENOUS
  Filled 2022-06-04 (×2): qty 2

## 2022-06-04 MED ORDER — MORPHINE SULFATE ER 15 MG PO TBCR: 15.0000 mg | EXTENDED_RELEASE_TABLET | Freq: Three times a day (TID) | ORAL | Status: DC

## 2022-06-04 NOTE — Assessment & Plan Note (Addendum)
Continue nicotine patch  °

## 2022-06-04 NOTE — Assessment & Plan Note (Signed)
On gabapentin 600 mg 3 times daily

## 2022-06-04 NOTE — Assessment & Plan Note (Addendum)
sepsis ruled out No definite source of infection noted but temperature 99.8 with pulse 116-124 respirations 18-29 and initially hypotensive at 95/61 responding to fluids.  CBC pending but was elevated on 8/4.  Procalcitonin 1.13 On IV Unasyn for aspiration pneumonia.  Switch to oral Augmentin at discharge per ID

## 2022-06-04 NOTE — TOC Initial Note (Signed)
Transition of Care (TOC) - Initial/Assessment Note    Patient Details  Name: Christina R. Arissa Fagin MRN: [id number] Date of Birth: 1961/07/20  Transition of Care [facility]) CM/SW Contact:    Valente David, RN Phone Number: 06/04/2022, 3:00 PM  Clinical Narrative:                 Spoke with patient at bedside, lives alone but has son in the area for support. Dr. Venora Maples is primary physician, denies need for medication assistance.  No PT/OT recommendations as of today. State she will need cab voucher when ready for discharge as her son works.  No other needs at this time.    Expected Discharge Plan: Home/Self Care Barriers to Discharge: Continued Medical Work up   Patient Goals and CMS Choice Patient states their goals for this hospitalization and ongoing recovery are:: Home, self care CMS Medicare.gov Compare Post Acute Care list provided to:: Patient    Expected Discharge Plan and Services Expected Discharge Plan: Home/Self Care       Living arrangements for the past 2 months: Apartment                                      Prior Living Arrangements/Services Living arrangements for the past 2 months: Apartment Lives with:: Self Patient language and need for interpreter reviewed:: No Do you feel safe going back to the place where you live?: Yes      Need for Family Participation in Patient Care: Yes (Comment) Care giver support system in place?: Yes (comment)   Criminal Activity/Legal Involvement Pertinent to Current Situation/Hospitalization: No - Comment as needed  Activities of Daily Living Home Assistive Devices/Equipment: None ADL Screening (condition at time of admission) Patient's cognitive ability adequate to safely complete daily activities?: Yes Is the patient deaf or have difficulty hearing?: No Does the patient have difficulty seeing, even when wearing glasses/contacts?: No Does the patient have difficulty concentrating, remembering, or making decisions?:  No Patient able to express need for assistance with ADLs?: Yes Does the patient have difficulty dressing or bathing?: No Independently performs ADLs?: Yes (appropriate for developmental age) Does the patient have difficulty walking or climbing stairs?: Yes Weakness of Legs: Both Weakness of Arms/Hands: None  Permission Sought/Granted Permission sought to share information with : Case Manager Permission granted to share information with : Yes, Verbal Permission Granted              Emotional Assessment Appearance:: Appears stated age Attitude/Demeanor/Rapport: Guarded, Engaged Affect (typically observed): Calm Orientation: : Oriented to Self, Oriented to Place, Oriented to  Time, Oriented to Situation   Psych Involvement: No (comment)  Admission diagnosis:  Dehydration [E86.0] Hypokalemia [E87.6] SIRS (systemic inflammatory response syndrome) (HCC) [R65.10] Altered mental status, unspecified altered mental status type [R41.82] Patient Active Problem List   Diagnosis Date Noted   Hyponatremia 06/04/2022   SIRS (systemic inflammatory response syndrome) ([place]) 06/04/2022   Abnormal urinalysis 06/04/2022   Cancer associated pain 06/04/2022   Underweight 06/04/2022   Possible Seizure disorder ([place]) 05/16/2022   Bipolar 1 disorder ([place]) 05/16/2022   Tobacco abuse 05/16/2022   Throat cancer ([place]) 05/16/2022   Hypokalemia 05/16/2022   Protein-calorie malnutrition, severe 05/16/2022   Internal jugular vein thrombosis, left ([place]) 05/16/2022   PCP:  Neomia Dear, MD Pharmacy:   [facility] [place] [facility] [address]  [place] [address] Phone: [phone] Fax: [phone]     Social Determinants of Health (SDOH) Interventions    Readmission Risk Interventions     No data to display

## 2022-06-04 NOTE — H&P (Signed)
History and Physical    Patient: Christina Porter DOB: 11-27-1960 DOA: 06/03/2022 DOS: the patient was seen and examined on 06/04/2022 PCP: Neomia Dear, MD  Patient coming from: Home  Chief Complaint:  Chief Complaint  Patient presents with   Vascular Access Problem    HPI: Christina Porter is a 61 y.o. 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  CTA chest was negative for PE and [place] and admission was recommended but she left AMA.  She now presents to the ED with a request to have her port flushed but was noted to be disoriented to time on arrival. ED course and data review: Tmax 99.8, pulse 1 16-1 24, respirations 18-21 with initial BP 95/61 improving to 133/91 and O2 sat 98% on room air Labs: VBG with pH 7.49 and PCO2 46.  Lactic acid 0.7.  CBC pending.  CMP with sodium 133 and potassium 3.2.  CBC from [facility] showed a WBC of 11,300 and hemoglobin 9.7 and urinalysis with moderate leukocyte esterase.  Procalcitonin 1.13 EKG, personally viewed and interpreted showed sinus tachycardia with nonspecific ST-T wave changes Imaging: Chest x-ray nonacute CT head: Left retroauricular soft tissue mass better assessed on CT CT soft tissue neck with contrast shows the following IMPRESSION: 1. Unchanged appearance of confluent nodal mass at left level 2A with areas of central necrosis. 2. Unchanged 5 mm right level 1A node. 3. Unchanged cutaneous lesion of the left retroauricular region. 4. Persistent occlusion of the left internal jugular  vein.  Patient was treated with LR bolus, oral and IV potassium repletion as well as Rocephin for possible UTI.  Hospitalist consulted for admission.     Past Medical History:  Diagnosis Date   Bipolar 1 disorder ([place])    Seizures ([place])    Past Surgical History:  Procedure Laterality Date   CESAREAN SECTION     Social History:  reports that she has been smoking cigarettes. She has never used smokeless tobacco. She reports current alcohol use. She reports that she does not use drugs.  No Known Allergies  Family History  Family history unknown: Yes    Prior to Admission medications   Medication Sig Start Date End Date Taking? Authorizing Provider  acetaminophen (TYLENOL) 325 MG tablet Take 2 tablets (650 mg total) by mouth every 4 (four) hours as needed for mild pain (or Fever >/= 101). 05/19/22   Dwyane Dee, MD  gabapentin (NEURONTIN) 300 MG capsule Take 2 capsules (600 mg total) by mouth 3 (three) times daily. 05/19/22   Dwyane Dee, MD  morphine (MS CONTIN) 15 MG 12 hr tablet Take 1 tablet (15 mg total) by mouth 3 (three) times daily. 05/19/22   Dwyane Dee, MD  oxyCODONE (ROXICODONE) 5 MG immediate release tablet Take 1 tablet (5 mg total) by mouth every 4 (four) hours as needed. 05/19/22 05/19/23  Dwyane Dee, MD  QUEtiapine (SEROQUEL) 100 MG tablet Take 100 mg by mouth at bedtime. 02/13/22   [provider]  traZODone (DESYREL) 100 MG tablet Take 100 mg by mouth at bedtime. 02/13/22   [provider]  Physical Exam: Vitals:   06/03/22 1726 06/03/22 1728 06/03/22 2044 06/03/22 2119  BP: 95/61  (!) 133/91   Pulse: (!) 116  (!) 124   Resp: 18  (!) 21   Temp: 99 F (37.2 C)   99.8 F (37.7 C)  TempSrc: Oral   Oral  SpO2: 98%  98%   Weight:  42 kg    Height:  '5\' 2"'$  (1.575 m)     Physical Exam Vitals and nursing note reviewed.  Constitutional:      General: She is not in acute distress.    Appearance: She is cachectic.  HENT:     Head:  Normocephalic and atraumatic.  Cardiovascular:     Rate and Rhythm: Normal rate and regular rhythm.     Heart sounds: Normal heart sounds.  Pulmonary:     Effort: Pulmonary effort is normal.     Breath sounds: Transmitted upper airway sounds present.  Abdominal:     Palpations: Abdomen is soft.     Tenderness: There is no abdominal tenderness.  Neurological:     Mental Status: Mental status is at baseline.     Labs on Admission: I have personally reviewed following labs and imaging studies  CBC: Recent Labs  Lab 06/04/22 0020  WBC 20.3*  NEUTROABS 17.5*  HGB 9.8*  HCT 29.9*  MCV 96.5  PLT 270*   Basic Metabolic Panel: Recent Labs  Lab 06/03/22 2125  NA 133*  K 3.2*  CL 95*  CO2 26  GLUCOSE 111*  BUN 6*  CREATININE 0.51  CALCIUM 8.6*   GFR: Estimated Creatinine Clearance: 49 mL/min (by C-G formula based on SCr of 0.51 mg/dL). Liver Function Tests: Recent Labs  Lab 06/03/22 2125  AST 18  ALT 9  ALKPHOS 106  BILITOT 0.5  PROT 6.1*  ALBUMIN 2.5*   No results for input(s): "LIPASE", "AMYLASE" in the last 168 hours. No results for input(s): "AMMONIA" in the last 168 hours. Coagulation Profile: No results for input(s): "INR", "PROTIME" in the last 168 hours. Cardiac Enzymes: No results for input(s): "CKTOTAL", "CKMB", "CKMBINDEX", "TROPONINI" in the last 168 hours. BNP (last 3 results) No results for input(s): "PROBNP" in the last 8760 hours. HbA1C: No results for input(s): "HGBA1C" in the last 72 hours. CBG: No results for input(s): "GLUCAP" in the last 168 hours. Lipid Profile: No results for input(s): "CHOL", "HDL", "LDLCALC", "TRIG", "CHOLHDL", "LDLDIRECT" in the last 72 hours. Thyroid Function Tests: No results for input(s): "TSH", "T4TOTAL", "FREET4", "T3FREE", "THYROIDAB" in the last 72 hours. Anemia Panel: No results for input(s): "VITAMINB12", "FOLATE", "FERRITIN", "TIBC", "IRON", "RETICCTPCT" in the last 72 hours. Urine analysis:     Component Value Date/Time   COLORURINE YELLOW (A) 05/15/2022 2348   APPEARANCEUR CLEAR (A) 05/15/2022 2348   APPEARANCEUR Clear 08/31/2013 0312   LABSPEC 1.028 05/15/2022 2348   LABSPEC 1.003 08/31/2013 0312   PHURINE 6.0 05/15/2022 2348   GLUCOSEU NEGATIVE 05/15/2022 2348   GLUCOSEU Negative 08/31/2013 0312   HGBUR NEGATIVE 05/15/2022 2348   [place] NEGATIVE 05/15/2022 2348   BILIRUBINUR Negative 08/31/2013 0312   KETONESUR NEGATIVE 05/15/2022 2348   PROTEINUR NEGATIVE 05/15/2022 2348   NITRITE NEGATIVE 05/15/2022 2348   LEUKOCYTESUR SMALL (A) 05/15/2022 2348   LEUKOCYTESUR Negative 08/31/2013 0312    Radiological Exams on Admission: CT Soft Tissue Neck W Contrast  Result Date: 06/04/2022 CLINICAL DATA:  Hematologic malignancy EXAM: CT NECK WITH CONTRAST TECHNIQUE: Multidetector CT imaging of the neck was performed using the standard  protocol following the bolus administration of intravenous contrast. RADIATION DOSE REDUCTION: This exam was performed according to the departmental dose-optimization program which includes automated exposure control, adjustment of the mA and/or kV according to patient size and/or use of iterative reconstruction technique. CONTRAST:  22m OMNIPAQUE IOHEXOL 350 MG/ML SOLN COMPARISON:  05/15/2022 FINDINGS: PHARYNX AND LARYNX: Rightward deviation of the pharynx and upper larynx is unchanged. There is persistent left asymmetric soft tissue thickening at the larynx. The airway is maintained. Post treatment thickening of the epiglottis. Unchanged small retropharyngeal effusion. SALIVARY GLANDS: Post treatment hyperenhancement of the salivary glands is unchanged. THYROID: Normal. LYMPH NODES: Conglomerate nodal mass at left level 2A with areas of central necrosis is unchanged allowing for differences in scan quality and contrast bolus timing. 5 mm right level 1A node is also unchanged. No new lymphadenopathy. VASCULAR: There is a right chest wall Port-A-Cath the  terminates below the field of view. The left internal jugular vein remains occluded. There is narrowing of the distal left ICA due to mass effect by the surrounding soft tissues, unchanged. LIMITED INTRACRANIAL: Normal. VISUALIZED ORBITS: Normal. MASTOIDS AND VISUALIZED PARANASAL SINUSES: No fluid levels or advanced mucosal thickening. No mastoid effusion. SKELETON: No bony spinal canal stenosis. No lytic or blastic lesions. UPPER CHEST: Clear. OTHER: Unchanged cutaneous lesion of the left retroauricular region. IMPRESSION: 1. Unchanged appearance of confluent nodal mass at left level 2A with areas of central necrosis. 2. Unchanged 5 mm right level 1A node. 3. Unchanged cutaneous lesion of the left retroauricular region. 4. 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 Electronically Signed   By: KUlyses JarredM.D.   On: 06/04/2022 00:18   CT HEAD WO CONTRAST (5MM)  Result Date: 06/03/2022 CLINICAL DATA:  Altered mental status, head and neck carcinoma EXAM: CT HEAD WITHOUT CONTRAST TECHNIQUE: Contiguous axial images were obtained from the base of the skull through the vertex without intravenous contrast. RADIATION DOSE REDUCTION: This exam was performed according to the departmental dose-optimization program which includes automated exposure control, adjustment of the mA and/or kV according to patient size and/or use of iterative reconstruction technique. COMPARISON:  MRI 05/16/2022 FINDINGS: Brain: Normal anatomic configuration. Parenchymal volume loss is commensurate with the patient's age. Mild periventricular white matter changes are present likely reflecting the sequela of small vessel ischemia. No abnormal intra or extra-axial mass lesion or fluid collection. No abnormal mass effect or midline shift. No evidence of acute intracranial hemorrhage or infarct. Ventricular size is normal. Cerebellum unremarkable. Vascular: No asymmetric hyperdense vasculature at the skull base. Skull: Intact  Sinuses/Orbits: Paranasal sinuses are clear. Orbits are unremarkable. Other: Mastoid air cells and middle ear cavities are clear. Left retroauricular soft tissue mass is partially visualized at the inferior margin of the examination, better assessed on CT examination of the neck on 05/15/2022. IMPRESSION: 1. No acute intracranial abnormality. 2. Left retroauricular soft tissue mass partially visualized, better assessed on CT examination of the neck on 05/15/2022. Electronically Signed   By: AFidela SalisburyM.D.   On: 06/03/2022 22:12   DG Chest Port 1 View  Result Date: 06/03/2022 CLINICAL DATA:  Questionable sepsis EXAM: PORTABLE CHEST 1 VIEW COMPARISON:  05/15/2022 FINDINGS: The heart size and mediastinal contours are within normal limits. Right chest port catheter. Both lungs are clear. The visualized skeletal structures are unremarkable. IMPRESSION: No acute abnormality of the lungs in AP portable projection. Electronically Signed   By: ADelanna AhmadiM.D.   On: 06/03/2022 21:20     Data Reviewed:  Relevant notes from primary care and specialist visits, past discharge summaries as available in EHR, including Care Everywhere. Prior diagnostic testing as pertinent to current admission diagnoses Updated medications and problem lists for reconciliation ED course, including vitals, labs, imaging, treatment and response to treatment Triage notes, nursing and pharmacy notes and ED provider's notes Notable results as noted in HPI   Assessment and Plan: * SIRS (systemic inflammatory response syndrome) (HCC) Possible sepsis No definite source of infection noted but temperature 99.8 with pulse 116-124 respirations 18-29 and initially hypotensive at 95/61 responding to fluids.  CBC pending but was elevated on 8/4.  Procalcitonin 1.13 Continue IV sepsis fluids We will continue ceftriaxone for possible UTI Prior concern for septic thrombophlebitis 7/17-7 21 but with negative blood cultures. Consider ID  consult  Internal jugular vein thrombosis, left (HCC) Continue heparin flushes Was hospitalized from 7/17 to 7/21 with concern for septic thrombophlebitis.  Blood cultures were negative.  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  PET scan 12/15/2020 also notable for local metastatic disease - follows with Dr. Harriette Ohara at [facility] - s/p chemo (cisplatin) -Recently contacted by radiation oncology (Dr. Crisoforo Oxford) 05/12/2022.  They are trying to reschedule study injection and simulation appointments; she has missed prior appointments. -  Possible Seizure disorder ([place]) On gabapentin 600 mg 3 times daily   Bipolar 1 disorder ([place]) Resume home meds pending med rec  Underweight BMI 16.94.  Suspect severe protein calorie malnutrition Nutritionist eval  Cancer associated pain Chronic narcotics  was on MS Contin with Oxy IR as needed Awaiting med rec to resume Oxycodone as needed in the interim   Abnormal urinalysis Patient was given a dose of Rocephin in the ED based on abnormal urinalysis done at [facility] on 8/4 We will continue ceftriaxone given concerns for SIRS and possible sepsis Follow blood and urine cultures  Hyponatremia IV hydration with NS  Tobacco abuse Nicotine patch        DVT prophylaxis: Lovenox  Consults: none  Advance Care Planning:   Code Status: Prior   Family Communication: none  Disposition Plan: Back to previous home environment  Severity of Illness: The appropriate patient status for this patient is INPATIENT. Inpatient status is judged to be reasonable and necessary in order to provide the required intensity of service to ensure the patient's safety. The patient's presenting symptoms, physical exam findings, and initial radiographic and laboratory data in the context of their chronic comorbidities  is felt to place them at high risk for further clinical deterioration. Furthermore, it is not anticipated that the patient will be medically stable for discharge from the hospital within 2 midnights of admission.   * I certify that at the point of admission it is my clinical judgment that the patient will require inpatient hospital care spanning beyond 2 midnights from the point of admission due to high intensity of service, high risk for further deterioration and high frequency of surveillance required.*  Author: Athena Masse, MD 06/04/2022 12:48 AM  For on call review [email or web address].

## 2022-06-04 NOTE — Assessment & Plan Note (Addendum)
Continue current pain medicine

## 2022-06-04 NOTE — ED Provider Notes (Signed)
-----------------------------------------   12:30 AM on 06/04/2022 -----------------------------------------   CT soft tissue neck is stable from prior; I personally reviewed patient's records from [facility] yesterday and noted she had moderate leukocytes.  Will initiate IV Rocephin.  Will consult hospitalist services for evaluation and admission.   Paulette Blanch, MD 06/04/22 [phone]

## 2022-06-04 NOTE — Assessment & Plan Note (Addendum)
Improved with hydration

## 2022-06-04 NOTE — Care Plan (Signed)
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  She presented in the ED in [place] where she presented with chest congestion, shortness of breath and hemoptysis.  CTA chest was negative for PE and admission was recommended but patient left AMA.  She presented in the [facility] ED with c/o: disorientation, hypertension.  CT head left retroauricular soft tissue mass better assessed on CT soft tissue neck.  Patient was admitted for sepsis secondary to possible UTI and started on IV fluids and IV antibiotics.  Patient was seen and examined. She appears comfortable, reports urinary symptoms.

## 2022-06-04 NOTE — Assessment & Plan Note (Addendum)
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  PET scan 12/15/2020 also notable for local metastatic disease - follows with Dr. Harriette Ohara at [facility] - s/p chemo (cisplatin) -Recently contacted by radiation oncology (Dr. Crisoforo Oxford) 05/12/2022.  They are trying to reschedule study injection and simulation appointments; she has missed prior appointments. Overall poor prognosis

## 2022-06-04 NOTE — Assessment & Plan Note (Signed)
Resume home meds pending med rec

## 2022-06-04 NOTE — Assessment & Plan Note (Signed)
BMI 16.94.  Suspect severe protein calorie malnutrition Nutritionist eval

## 2022-06-04 NOTE — Assessment & Plan Note (Addendum)
Continue heparin flushes Was hospitalized from 7/17 to 7/21 with concern for septic thrombophlebitis.  Blood cultures were negative.  Was treated with Unasyn and then Augmentin to complete a 2-week course ID following

## 2022-06-04 NOTE — Assessment & Plan Note (Addendum)
Patient was given a dose of Rocephin in the ED based on abnormal urinalysis done at [facility] on 8/4 We will continue ceftriaxone given concerns for SIRS and possible sepsis Follow blood and urine cultures

## 2022-06-05 DIAGNOSIS — J690 Pneumonitis due to inhalation of food and vomit: Secondary | ICD-10-CM | POA: Diagnosis not present

## 2022-06-05 DIAGNOSIS — I82C12 Acute embolism and thrombosis of left internal jugular vein: Secondary | ICD-10-CM | POA: Diagnosis not present

## 2022-06-05 DIAGNOSIS — R6510 Systemic inflammatory response syndrome (SIRS) of non-infectious origin without acute organ dysfunction: Secondary | ICD-10-CM | POA: Diagnosis not present

## 2022-06-05 DIAGNOSIS — C051 Malignant neoplasm of soft palate: Secondary | ICD-10-CM

## 2022-06-05 LAB — GLUCOSE, CAPILLARY
Glucose-Capillary: 148 mg/dL — ABNORMAL HIGH (ref 70–99)
Glucose-Capillary: 165 mg/dL — ABNORMAL HIGH (ref 70–99)
Glucose-Capillary: 173 mg/dL — ABNORMAL HIGH (ref 70–99)

## 2022-06-05 LAB — URINE CULTURE: Culture: NO GROWTH

## 2022-06-05 LAB — BASIC METABOLIC PANEL
Anion gap: 9 (ref 5–15)
BUN: 5 mg/dL — ABNORMAL LOW (ref 8–23)
CO2: 30 mmol/L (ref 22–32)
Calcium: 8.6 mg/dL — ABNORMAL LOW (ref 8.9–10.3)
Chloride: 100 mmol/L (ref 98–111)
Creatinine, Ser: 0.54 mg/dL (ref 0.44–1.00)
GFR, Estimated: >60 mL/min (ref >60)
Glucose, Bld: 112 mg/dL — ABNORMAL HIGH (ref 70–99)
Potassium: 4.5 mmol/L (ref 3.5–5.1)
Sodium: 139 mmol/L (ref 135–145)

## 2022-06-05 LAB — PHOSPHORUS
Phosphorus: 3 mg/dL (ref 2.5–4.6)
Phosphorus: 1.8 mg/dL — ABNORMAL LOW (ref 2.5–4.6)
Phosphorus: 1.7 mg/dL — ABNORMAL LOW (ref 2.5–4.6)

## 2022-06-05 LAB — CBC
HCT: 24.7 % — ABNORMAL LOW (ref 36.0–46.0)
Hemoglobin: 7.9 g/dL — ABNORMAL LOW (ref 12.0–15.0)
MCH: 31.5 pg (ref 26.0–34.0)
MCHC: 32 g/dL (ref 30.0–36.0)
MCV: 98.4 fL (ref 80.0–100.0)
Platelets: 419 10*3/uL — ABNORMAL HIGH (ref 150–400)
RBC: 2.51 MIL/uL — ABNORMAL LOW (ref 3.87–5.11)
RDW: 14.1 % (ref 11.5–15.5)
WBC: 24.6 10*3/uL — ABNORMAL HIGH (ref 4.0–10.5)
nRBC: 0 % (ref 0.0–0.2)

## 2022-06-05 LAB — HEMOGLOBIN AND HEMATOCRIT, BLOOD
HCT: 24.7 % — ABNORMAL LOW (ref 36.0–46.0)
Hemoglobin: 8 g/dL — ABNORMAL LOW (ref 12.0–15.0)

## 2022-06-05 LAB — MAGNESIUM
Magnesium: 1.2 mg/dL — ABNORMAL LOW (ref 1.7–2.4)
Magnesium: 1.9 mg/dL (ref 1.7–2.4)
Magnesium: 2 mg/dL (ref 1.7–2.4)

## 2022-06-05 MED ORDER — ADULT MULTIVITAMIN W/MINERALS CH
1.0000 | ORAL_TABLET | Freq: Every day | ORAL | Status: DC
  Administered 2022-06-05 – 2022-06-06 (×2): 1 via ORAL
  Filled 2022-06-05 (×2): qty 1

## 2022-06-05 MED ORDER — PROSOURCE TF PO LIQD
45.0000 mL | Freq: Two times a day (BID) | ORAL | Status: DC
Start: 2022-06-05 — End: 2022-06-09
  Administered 2022-06-05 – 2022-06-08 (×8): 45 mL
  Filled 2022-06-05 (×2): qty 45

## 2022-06-05 MED ORDER — OSMOLITE 1.5 CAL PO LIQD
237.0000 mL | Freq: Four times a day (QID) | ORAL | Status: DC
  Administered 2022-06-05 – 2022-06-08 (×15): 237 mL

## 2022-06-05 MED ORDER — FLUCONAZOLE IN SODIUM CHLORIDE 200-0.9 MG/100ML-% IV SOLN
200.0000 mg | INTRAVENOUS | Status: DC
  Administered 2022-06-06 – 2022-06-09 (×4): 200 mg via INTRAVENOUS
  Filled 2022-06-05 (×4): qty 100

## 2022-06-05 MED ORDER — VITAL HIGH PROTEIN PO LIQD: 1000.0000 mL | ORAL | Status: DC

## 2022-06-05 MED ORDER — PIPERACILLIN-TAZOBACTAM 3.375 G IVPB
3.3750 g | Freq: Three times a day (TID) | INTRAVENOUS | Status: AC
  Administered 2022-06-05 – 2022-06-07 (×6): 3.375 g via INTRAVENOUS
  Filled 2022-06-05 (×7): qty 50

## 2022-06-05 MED ORDER — MAGNESIUM SULFATE 2 GM/50ML IV SOLN
2.0000 g | Freq: Once | INTRAVENOUS | Status: AC
  Administered 2022-06-05: 2 g via INTRAVENOUS
  Filled 2022-06-05: qty 50

## 2022-06-05 MED ORDER — LACTATED RINGERS IV BOLUS
1000.0000 mL | Freq: Once | INTRAVENOUS | Status: AC
  Administered 2022-06-05: 1000 mL via INTRAVENOUS

## 2022-06-05 MED ORDER — SODIUM CHLORIDE 0.9 % IV SOLN
3.0000 g | Freq: Four times a day (QID) | INTRAVENOUS | Status: DC
  Administered 2022-06-05: 3 g via INTRAVENOUS
  Filled 2022-06-05 (×2): qty 8

## 2022-06-05 MED ORDER — FLUCONAZOLE IN SODIUM CHLORIDE 400-0.9 MG/200ML-% IV SOLN
400.0000 mg | Freq: Once | INTRAVENOUS | Status: AC
  Administered 2022-06-05: 400 mg via INTRAVENOUS
  Filled 2022-06-05: qty 200

## 2022-06-05 MED ORDER — LACTATED RINGERS IV SOLN: INTRAVENOUS | Status: DC

## 2022-06-05 NOTE — Progress Notes (Signed)
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  She presented in the ED in [place] where she presented with chest congestion, shortness of breath and hemoptysis.  CTA chest was negative for PE and admission was recommended but patient left AMA.  She presented in the [facility] ED with c/o: disorientation, hypertension.  CT head left retroauricular soft tissue mass better assessed on CT soft tissue neck.  Patient was admitted for sepsis secondary to possible UTI and started on IV fluids and IV antibiotics. Infectious diseases consulted.  Since patient follows up with heme oncology at [facility] , attempt was made to transfer the patient to [facility] but no bed available.  Assessment & Plan:   Principal Problem:   SIRS (systemic inflammatory response syndrome) (HCC) Active Problems:   Throat cancer (HCC)   Internal jugular vein thrombosis, left (HCC)   Possible Seizure disorder (HCC)   Bipolar 1 disorder (HCC)   Tobacco abuse   Hyponatremia   Abnormal urinalysis   Cancer associated pain   Underweight  Possible sepsis secondary to necrotic mass: Patient presented with tachycardia, tachypnea, hypotension responsive to IV hydration, leukocytosis, procalcitonin 1.13.  Lactic acid 0.8, UA : not significantly abnormal. Continue IV fluids as per sepsis protocol. Patient is started on IV ceftriaxone for possible UTI. ID is consulted,  awaiting recommendation. Antibiotics changed to Unasyn.  Internal  jugular vein thrombosis left: Patient was hospitalized from 7/17 to 7/21 with concern for septic thrombophlebitis. Cultures were negative.  Patient was treated with Unasyn and Augmentin to complete 2 weeks course. Follow-up repeat blood culture.  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   PET scan 1216/2012 was notable for local metastatic disease. Patient follows with Dr. Harriette Ohara at [facility].  S/p chemo ( Cisplatin) Recently contacted by radiation oncology Dr. Audelia Acton they are trying to reschedule steroid injection and simulation appointments,  she has missed prior appointments.  Possible seizure disorder : Continue gabapentin  Cancer related pain/chronic pain: Continue oxycodone as needed  Hyponatremia: Resolved with IV hydration.  Tobacco abuse: Continue nicotine patch.  UTI: Continue ceftriaxone, Follow-up blood and urine culture.  Underweight: BMI 16.94 suspect severe protein calorie malnutrition Nutritionist evaluation   DVT prophylaxis: SCDs Code Status: Full code. Family Communication: No family at bed side. Disposition Plan:   Status is: Inpatient Remains inpatient appropriate because:  Admitted with sepsis possibly secondary to necrotic mass.  Infectious diseases consulted ,  Patient is getting IV antibiotics.  Attempt was made to transfer the patient to [facility] but there is no bed available.    Consultants:  Infectious diseases  Procedures: CT head, CT soft neck Antimicrobials:  Anti-infectives (From admission, onward)    Start     Dose/Rate Route Frequency Ordered Stop   06/04/22 0115  cefTRIAXone (ROCEPHIN) 2 g in sodium chloride 0.9 % 100 mL IVPB        2 g 200 mL/hr over 30 Minutes Intravenous Every 24 hours 06/04/22 0102 06/11/22 0114  06/04/22 0045  cefTRIAXone (ROCEPHIN) 1 g in sodium chloride 0.9 % 100 mL IVPB  Status:  Discontinued        1 g 200 mL/hr over 30 Minutes  Intravenous  Once 06/04/22 0030 06/04/22 0109        Subjective: Patient was seen and examined at bedside.  Overnight events noted.   Patient appears frail, cachectic,  lying comfortably on 4 L of oxygen.  Objective: Vitals:   06/05/22 0453 06/05/22 0654 06/05/22 0852 06/05/22 1158  BP: 106/72 (!) 81/48 (!) 104/59 94/63  Pulse: (!) 115 (!) 122 (!) 118 (!) 107  Resp: 20 20 (!) 21 (!) 21  Temp: 99.2 F (37.3 C) 98.9 F (37.2 C) 97.8 F (36.6 C)   TempSrc: Oral Oral Oral   SpO2: 98% 100% 92% 100%  Weight:      Height:        Intake/Output Summary (Last 24 hours) at 06/05/2022 1311 Last data filed at 06/05/2022 1029 Gross per 24 hour  Intake 1428.9 ml  Output 300 ml  Net 1128.9 ml   Filed Weights   06/03/22 1728 06/04/22 0201  Weight: 42 kg 80 kg    Examination:  General exam: Appears frail, cachectic, not in any acute distress, on oxygen. Respiratory system: Decreased breath sounds, respiratory effort normal, RR 15 Cardiovascular system: S1 & S2 heard, regular rate rhythm, no murmur. Gastrointestinal system: Abdomen is soft, nontender, nondistended, BS +. Central nervous system: Alert and oriented x 2 . No focal neurological deficits. Extremities: No edema, no cyanosis, no clubbing. Skin: No rashes, lesions or ulcers Psychiatry: Judgement and insight appear normal. Mood & affect appropriate.     Data Reviewed: I have personally reviewed following labs and imaging studies  CBC: Recent Labs  Lab 06/04/22 0020 06/04/22 0852 06/05/22 0510 06/05/22 0831  WBC 20.3* 19.7* 24.6*  --   NEUTROABS 17.5*  --   --   --   HGB 9.8* 11.9* 7.9* 8.0*  HCT 29.9* 35.8* 24.7* 24.7*  MCV 96.5 96.0 98.4  --   PLT 518* 471* 419*  --    Basic Metabolic Panel: Recent Labs  Lab 06/03/22 2125 06/04/22 0852 06/05/22 0510  NA 133* 135 139  K 3.2* 2.9* 4.5  CL 95* 95* 100  CO2 '26 27 30  '$ GLUCOSE 111* 113* 112*  BUN 6* <5* 5*  CREATININE 0.51 0.53 0.54  CALCIUM 8.6* 9.3 8.6*   MG  --   --  1.2*  PHOS  --   --  3.0   GFR: Estimated Creatinine Clearance: 72.4 mL/min (by C-G formula based on SCr of 0.54 mg/dL). Liver Function Tests: Recent Labs  Lab 06/03/22 2125  AST 18  ALT 9  ALKPHOS 106  BILITOT 0.5  PROT 6.1*  ALBUMIN 2.5*   No results for input(s): "LIPASE", "AMYLASE" in the last 168 hours. No results for input(s): "AMMONIA" in the last 168 hours. Coagulation Profile: Recent Labs  Lab 06/04/22 0020  INR 1.1   Cardiac Enzymes: No results for input(s): "CKTOTAL", "CKMB", "CKMBINDEX", "TROPONINI" in the last 168 hours. BNP (last 3 results) No results for input(s): "PROBNP" in the last 8760 hours. HbA1C: No results for input(s): "HGBA1C" in the last 72 hours. CBG: No results for input(s): "GLUCAP" in the last 168 hours. Lipid Profile: No results for input(s): "CHOL", "HDL", "LDLCALC", "TRIG", "CHOLHDL", "LDLDIRECT" in the last 72 hours. Thyroid Function Tests: No results for input(s): "TSH", "T4TOTAL", "FREET4", "T3FREE", "THYROIDAB" in the last 72 hours.  Anemia Panel: No results for input(s): "VITAMINB12", "FOLATE", "FERRITIN", "TIBC", "IRON", "RETICCTPCT" in the last 72 hours. Sepsis Labs: Recent Labs  Lab 06/03/22 2124 06/03/22 2125 06/04/22 0020 06/04/22 0852  PROCALCITON  --  1.13  --  0.91  LATICACIDVEN 0.7  --  0.8  --     Recent Results (from the past 240 hour(s))  Blood Culture (routine x 2)     Status: None (Preliminary result)   Collection Time: 06/03/22  9:24 PM   Specimen: BLOOD  Result Value Ref Range Status   Specimen Description BLOOD BLOOD LEFT FOREARM  Final   Special Requests   Final    BOTTLES DRAWN AEROBIC AND ANAEROBIC Blood Culture adequate volume   Culture   Final    NO GROWTH 2 DAYS Performed at [facility], [address] [place], [place] [address]    Report Status PENDING  Incomplete  Blood Culture (routine x 2)     Status: None (Preliminary result)   Collection Time: 06/03/22  9:26 PM    Specimen: BLOOD  Result Value Ref Range Status   Specimen Description BLOOD BLOOD RIGHT FOREARM  Final   Special Requests   Final    BOTTLES DRAWN AEROBIC AND ANAEROBIC Blood Culture results may not be optimal due to an inadequate volume of blood received in culture bottles   Culture   Final    NO GROWTH 2 DAYS Performed at [facility], [address] [place], [place] [address]    Report Status PENDING  Incomplete  SARS Coronavirus 2 by RT PCR (hospital order, performed in [facility] hospital lab) *cepheid single result test*     Status: None   Collection Time: 06/04/22 12:20 AM   Specimen: Nasal Swab  Result Value Ref Range Status   SARS Coronavirus 2 by RT PCR NEGATIVE NEGATIVE Final    Comment: (NOTE) SARS-CoV-2 target nucleic acids are NOT DETECTED.  The SARS-CoV-2 RNA is generally detectable in upper and lower respiratory specimens during the acute phase of infection. The lowest concentration of SARS-CoV-2 viral copies this assay can detect is 250 copies / mL. A negative result does not preclude SARS-CoV-2 infection and should not be used as the sole basis for treatment or other patient management decisions.  A negative result may occur with improper specimen collection / handling, submission of specimen other than nasopharyngeal swab, presence of viral mutation(s) within the areas targeted by this assay, and inadequate number of viral copies (<250 copies / mL). A negative result must be combined with clinical observations, patient history, and epidemiological information.  Fact Sheet for Patients:   https://www.patel.info/  Fact Sheet for Healthcare Providers: https://hall.com/  This test is not yet approved or  cleared by the [place] FDA and has been authorized for detection and/or diagnosis of SARS-CoV-2 by FDA under an Emergency Use Authorization (EUA).  This EUA will remain in effect (meaning this test  can be used) for the duration of the COVID-19 declaration under Section 564(b)(1) of the Act, 21 U.S.C. section 360bbb-3(b)(1), unless the authorization is terminated or revoked sooner.  Performed at [facility], [address] [place], [place] [address]     Radiology Studies: CT Soft Tissue Neck W Contrast  Result Date: 06/04/2022 CLINICAL DATA:  Hematologic malignancy EXAM: CT NECK WITH CONTRAST TECHNIQUE: Multidetector CT imaging of the neck was performed using the standard protocol following the bolus administration of intravenous contrast. RADIATION DOSE REDUCTION: This exam was performed according to the departmental  dose-optimization program which includes automated exposure control, adjustment of the mA and/or kV according to patient size and/or use of iterative reconstruction technique. CONTRAST:  58m OMNIPAQUE IOHEXOL 350 MG/ML SOLN COMPARISON:  05/15/2022 FINDINGS: PHARYNX AND LARYNX: Rightward deviation of the pharynx and upper larynx is unchanged. There is persistent left asymmetric soft tissue thickening at the larynx. The airway is maintained. Post treatment thickening of the epiglottis. Unchanged small retropharyngeal effusion. SALIVARY GLANDS: Post treatment hyperenhancement of the salivary glands is unchanged. THYROID: Normal. LYMPH NODES: Conglomerate nodal mass at left level 2A with areas of central necrosis is unchanged allowing for differences in scan quality and contrast bolus timing. 5 mm right level 1A node is also unchanged. No new lymphadenopathy. VASCULAR: There is a right chest wall Port-A-Cath the terminates below the field of view. The left internal jugular vein remains occluded. There is narrowing of the distal left ICA due to mass effect by the surrounding soft tissues, unchanged. LIMITED INTRACRANIAL: Normal. VISUALIZED ORBITS: Normal. MASTOIDS AND VISUALIZED PARANASAL SINUSES: No fluid levels or advanced mucosal thickening. No mastoid effusion. SKELETON: No  bony spinal canal stenosis. No lytic or blastic lesions. UPPER CHEST: Clear. OTHER: Unchanged cutaneous lesion of the left retroauricular region. IMPRESSION: 1. Unchanged appearance of confluent nodal mass at left level 2A with areas of central necrosis. 2. Unchanged 5 mm right level 1A node. 3. Unchanged cutaneous lesion of the left retroauricular region. 4. 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 Electronically Signed   By: KUlyses JarredM.D.   On: 06/04/2022 00:18   CT HEAD WO CONTRAST (5MM)  Result Date: 06/03/2022 CLINICAL DATA:  Altered mental status, head and neck carcinoma EXAM: CT HEAD WITHOUT CONTRAST TECHNIQUE: Contiguous axial images were obtained from the base of the skull through the vertex without intravenous contrast. RADIATION DOSE REDUCTION: This exam was performed according to the departmental dose-optimization program which includes automated exposure control, adjustment of the mA and/or kV according to patient size and/or use of iterative reconstruction technique. COMPARISON:  MRI 05/16/2022 FINDINGS: Brain: Normal anatomic configuration. Parenchymal volume loss is commensurate with the patient's age. Mild periventricular white matter changes are present likely reflecting the sequela of small vessel ischemia. No abnormal intra or extra-axial mass lesion or fluid collection. No abnormal mass effect or midline shift. No evidence of acute intracranial hemorrhage or infarct. Ventricular size is normal. Cerebellum unremarkable. Vascular: No asymmetric hyperdense vasculature at the skull base. Skull: Intact Sinuses/Orbits: Paranasal sinuses are clear. Orbits are unremarkable. Other: Mastoid air cells and middle ear cavities are clear. Left retroauricular soft tissue mass is partially visualized at the inferior margin of the examination, better assessed on CT examination of the neck on 05/15/2022. IMPRESSION: 1. No acute intracranial abnormality. 2. Left retroauricular soft tissue  mass partially visualized, better assessed on CT examination of the neck on 05/15/2022. Electronically Signed   By: AFidela SalisburyM.D.   On: 06/03/2022 22:12   DG Chest Port 1 View  Result Date: 06/03/2022 CLINICAL DATA:  Questionable sepsis EXAM: PORTABLE CHEST 1 VIEW COMPARISON:  05/15/2022 FINDINGS: The heart size and mediastinal contours are within normal limits. Right chest port catheter. Both lungs are clear. The visualized skeletal structures are unremarkable. IMPRESSION: No acute abnormality of the lungs in AP portable projection. Electronically Signed   By: ADelanna AhmadiM.D.   On: 06/03/2022 21:20     Scheduled Meds:  gabapentin  600 mg Per Tube TID   heparin flush  10 Units Intracatheter Once  morphine  10 mg Per Tube Q6H   potassium chloride  40 mEq Per Tube BID   QUEtiapine  100 mg Per Tube QHS   traZODone  100 mg Per Tube QHS   Continuous Infusions:  cefTRIAXone (ROCEPHIN)  IV Stopped (06/05/22 0117)   lactated ringers 100 mL/hr at 06/05/22 0549     LOS: 1 day    Time spent: 68 mins    Dhruv Christina, MD [facility]   If 7PM-7AM, please contact night-coverage

## 2022-06-05 NOTE — Consult Note (Signed)
Christina Porter, Christina Porter [id number] 10-01-1961 Shawna Clamp, MD  Reason for Consult: History of head neck cancer currently undergoing treatment at [facility] cancer center recently admitted for pneumonia noted to have some blood-tinged mucus with coughing.  HPI: Patient with a history of extensive oropharyngeal squamous cell carcinoma which based on recent CT scan appears to have direct extension into the neck with a draining wound in the upper posterior left neck.  She is not a very good historian.  Allergies: No Known Allergies  ROS: Review of systems normal other than 12 systems except per HPI.  PMH:  Past Medical History:  Diagnosis Date   Bipolar 1 disorder ([place])    Seizures ([place])     FH:  Family History  Family history unknown: Yes    SH:  Social History   Socioeconomic History   Marital status: Married    Spouse name: Not on file   Number of children: Not on file   Years of education: Not on file   Highest education level: Not on file  Occupational History   Not on file  Tobacco Use   Smoking status: Every Day    Types: Cigarettes   Smokeless tobacco: Never  Vaping Use   Vaping Use: Never used  Substance and Sexual Activity   Alcohol use: Yes   Drug use: Never   Sexual activity: Not on file  Other Topics Concern   Not on file  Social History Narrative   ** Merged History Encounter **       Social Determinants of Health   Financial Resource Strain: Not on file  Food Insecurity: Not on file  Transportation Needs: Not on file  Physical Activity: Not on file  Stress: Not on file  Social Connections: Not on file  Intimate Partner Violence: Not on file    PSH:  Past Surgical History:  Procedure Laterality Date   CESAREAN SECTION      Physical  Exam: During exam difficult to understand her speech as she was somewhat slurring and has extensive head neck cancer.  The external ears are clear to clear the anterior nose but not oral cavity or pharynx she has  significant trismus and despite multiple attempts was very difficult to examine due to gag reflex and the trismus.  It does appear that the focus of her cancer is a left posterior pharynx.  There is a bandage in the left neck which I removed she has a fungating ulcerative lesion with obvious drainage consistent with metastatic disease and/or direct extension into the neck.  After consent a flexible fiberoptic laryngoscope was passed through the right nostril.  There was extensive swelling of the nasopharynx mainly on the left as well as the soft palate.  There was significant edema of the arytenoids and the epiglottis and all the view was limited I believe that her left vocal cord is likely nonfunctional.  It also appears that she is actively aspirating.  I can see no evidence of active bleeding, she does have blood-tinged mucus when she coughs and spits up mucus.  Procedure: Flexible fiberoptic laryngoscopy   A/P: Extensive head neck cancer involving the left posterior pharynx based on CT scan and appears to have extensive metastatic disease or direct extension into the neck.  She is currently undergoing treatment at [facility].  This blood-tinged mucus is likely coming from the head neck cancer.  I did not see any active bleeding however there is extensive disease in the neck and she is at  risk for acute arterial bleeding from tumor erosion and tumor necrosis from treatment.  I have spoken with Dr. Tiburcio Pea is a head neck cancer surgeon at [facility].  He has looked at her chart and by his report she has an end-stage head neck cancer that is undergoing experimental treatment.  He is going to speak with her oncologist at [facility] to discuss the possibility of transfer to their service.  In the meantime I would make her n.p.o. and get a modified barium swallow to rule out aspiration.  I will convey these messages with the hospitalist service.  There is no ENT surgical intervention needed at this point.  It is  likely she will continue to have blood-tinged mucus due to this cancer.  I will wait to hear back from Dr. Ihor Austin before we discuss any possible embolization to prevent further bleeding.   Roena Malady 06/05/2022 5:17 PM

## 2022-06-05 NOTE — Consult Note (Signed)
Pharmacy Antibiotic Note  Christina Porter is a 61 y.o. female admitted on 06/03/2022 with sepsis.  Pharmacy has been consulted for Unasyn dosing.  Plan: Unasyn 3gm q 6hrs  Height: '5\' 2"'$  (157.5 cm) Weight: 80 kg (176 lb 5.9 oz) IBW/kg (Calculated) : 50.1  Temp (24hrs), Avg:98.6 F (37 C), Min:97.8 F (36.6 C), Max:99.2 F (37.3 C)  Recent Labs  Lab 06/03/22 2124 06/03/22 2125 06/04/22 0020 06/04/22 0852 06/05/22 0510  WBC  --   --  20.3* 19.7* 24.6*  CREATININE  --  0.51  --  0.53 0.54  LATICACIDVEN 0.7  --  0.8  --   --     Estimated Creatinine Clearance: 72.4 mL/min (by C-G formula based on SCr of 0.54 mg/dL).    No Known Allergies  Antimicrobials this admission: 8/6 Ceftriaxone > 8/7  Dose adjustments this admission: n/a  Microbiology results: 8/5 BCx: NGTD 8/6 UCx: pending   Thank you for allowing pharmacy to be a part of this patient's care.  Mariangel Ringley Rodriguez-Guzman PharmD, BCPS 06/05/2022 3:14 PM

## 2022-06-05 NOTE — TOC Progression Note (Signed)
Transition of Care (TOC) - Progression Note    Patient Details  Name: Nile R. Mera Gunkel MRN: [id number] Date of Birth: Mar 16, 1961  Transition of Care [facility]) CM/SW [place], LCSW Phone Number: 06/05/2022, 1:09 PM  Clinical Narrative:  Patient is unsure who supplies her home oxygen. [employer], [employer], and [employer] did not sound familiar to her. Sent her information to [employer] representative to see if it is them. She said her son would not know the agency either.  Expected Discharge Plan: Home/Self Care Barriers to Discharge: Continued Medical Work up  Expected Discharge Plan and Services Expected Discharge Plan: Home/Self Care       Living arrangements for the past 2 months: Apartment                                       Social Determinants of Health (SDOH) Interventions    Readmission Risk Interventions     No data to display

## 2022-06-05 NOTE — Progress Notes (Signed)
Patient had three adult visitors. Subsequent to visitors leaving patient found with half a cup of some kind of alcohol. She states she has not consumed any, and it was given to her by one of her visitors.

## 2022-06-05 NOTE — Consult Note (Signed)
NAME: Christina Porter  DOB: 1961-03-31  MRN: [id number]  Date/Time: 06/05/2022 8:44 AM  REQUESTING PROVIDER; Dr.Kumar Subjective:  REASON FOR CONSULT: Leucocytosis ? Christina Porter is a 61 y.o. 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 Patient was recently in [facility] between 05/15/2022 until 05/19/2022 for left-sided facial swelling and neck swelling and there was a concern for septic thrombophlebitis of the left IJ.  The left IJ was obstructed for quite some time.  The patient was treated with IV Unasyn and sent on p.o. Augmentin.  She was asked to follow-up with [facility] heme-onc after communicating with them.  She saw her oncologist and radiation therapy was started on 05/30/2022 Patient went to [facility] ED on 06/02/2022 with hypoxia and intermittent dyspnea with sats of 86%.  There was concern for aspiration pneumonia. Patient left AMA and came to [facility] ED on 06/02/2022 and left AMA from this hospital also. She returned to the ED on 06/03/2022 asking for the port to be flushed but was found to be tachypneic and tachycardia and hence was hospitalized.  06/03/22  BP 133/91 (H)  Temp 99.8 F (37.7 C)  Pulse Rate 124 !  Resp 21 !  SpO2 98 %    Latest Reference Range & Units 06/04/22  WBC 4.0 - 10.5 K/uL 19.7 (H)  Hemoglobin 12.0 - 15.0 g/dL 11.9 (L)  HCT 36.0 - 46.0 % 35.8 (L)  Platelets 150 - 400 K/uL 471 (H)  Creatinine 0.44 - 1.00 mg/dL 0.53  CT of the head showed left retroauricular soft tissue mass partially visualized.  But otherwise no intracerebral acute abnormality. CT of the neck showed unchanged appearance of a confluent nodal mass with area of central necrosis on the left side Persistent occlusion  of the left internal jugular vein cutaneous lesion on the left left retroauricular region.  And narrowing of the distal left ICA due to mass effect Chest x-ray showed no infiltrate even though CTA chest done on 06/02/2022 showed trace fluid in the distal esophagus, bilateral dependent atelectasis in the lungs,  Past Medical History:  Diagnosis Date   Bipolar 1 disorder ([place])    Seizures ([place])     Past Surgical History:  Procedure Laterality Date   CESAREAN SECTION      Social History   Socioeconomic History   Marital status: Married    Spouse name: Not on file   Number of children: Not on file   Years of education: Not on file   Highest education level: Not on file  Occupational History   Not on file  Tobacco Use   Smoking status: Every Day    Types: Cigarettes   Smokeless tobacco: Never  Vaping Use   Vaping Use: Never used  Substance and Sexual Activity   Alcohol use: Yes   Drug use: Never   Sexual activity: Not on file  Other Topics Concern   Not on file  Social History Narrative   ** Merged History Encounter **       Social Determinants of Health   Financial Resource Strain: Not on file  Food Insecurity: Not on file  Transportation Needs: Not on file  Physical Activity: Not on  file  Stress: Not on file  Social Connections: Not on file  Intimate Partner Violence: Not on file    Family History  Family history unknown: Yes   No Known Allergies I? Current Facility-Administered Medications  Medication Dose Route Frequency Provider Last Rate Last Admin   acetaminophen (TYLENOL) 160 MG/5ML solution 650 mg  650 mg Per Tube Q6H PRN Athena Masse, MD       acetaminophen (TYLENOL) tablet 650 mg  650 mg Oral Q6H PRN Athena Masse, MD       Or   acetaminophen (TYLENOL) suppository 650 mg  650 mg Rectal Q6H PRN Athena Masse, MD       cefTRIAXone (ROCEPHIN) 2 g in sodium chloride 0.9 % 100 mL IVPB  2 g Intravenous Q24H Athena Masse, MD   Stopped at 06/05/22  0117   enoxaparin (LOVENOX) injection 40 mg  40 mg Subcutaneous Q24H Athena Masse, MD   40 mg at 06/04/22 0258   gabapentin (NEURONTIN) 250 MG/5ML solution 600 mg  600 mg Per Tube TID Athena Masse, MD   600 mg at 06/04/22 2048   heparin flush 10 UNIT/ML injection 10 Units  10 Units Intracatheter Once Versie Starks, PA-C       lactated ringers infusion   Intravenous Continuous Athena Masse, MD 100 mL/hr at 06/05/22 0549 Infusion Verify at 06/05/22 0549   magnesium sulfate IVPB 2 g 50 mL  2 g Intravenous Once Shawna Clamp, MD       morphine 10 MG/5ML solution 10 mg  10 mg Per Tube Q6H Athena Masse, MD   10 mg at 06/05/22 0512   ondansetron (ZOFRAN) tablet 4 mg  4 mg Oral Q6H PRN Athena Masse, MD       Or   ondansetron [facility]) injection 4 mg  4 mg Intravenous Q6H PRN Athena Masse, MD       oxyCODONE (ROXICODONE) 5 MG/5ML solution 5 mg  5 mg Per Tube Q4H PRN Athena Masse, MD   5 mg at 06/04/22 2052   potassium chloride (KLOR-CON) packet 40 mEq  40 mEq Per Tube BID Lucrezia Starch, MD   40 mEq at 06/04/22 2046   QUEtiapine (SEROQUEL) tablet 100 mg  100 mg Per Tube QHS Athena Masse, MD   100 mg at 06/04/22 2046   traZODone (DESYREL) tablet 100 mg  100 mg Per Tube QHS Athena Masse, MD   100 mg at 06/04/22 2046     Abtx:  Anti-infectives (From admission, onward)    Start     Dose/Rate Route Frequency Ordered Stop   06/04/22 0115  cefTRIAXone (ROCEPHIN) 2 g in sodium chloride 0.9 % 100 mL IVPB        2 g 200 mL/hr over 30 Minutes Intravenous Every 24 hours 06/04/22 0102 06/11/22 0114   06/04/22 0045  cefTRIAXone (ROCEPHIN) 1 g in sodium chloride 0.9 % 100 mL IVPB  Status:  Discontinued        1 g 200 mL/hr over 30 Minutes Intravenous  Once 06/04/22 0030 06/04/22 0109       REVIEW OF SYSTEMS: Patient is slightly confused Not able to give a good review of system Complains of swelling and pain on the left side of the neck and face Const: negative fever, negative  chills, ++ weight loss Eyes: negative diplopia or visual changes, negative eye pain ENT: Hoarse throat and difficulty swallowing has Resp: Secretions, sputum  and shortness of breath Cards: negative for chest pain, palpitations, lower extremity edema GU: negative for frequency, dysuria and hematuria GI: Has PEG tube General Skin: negative for rash and pruritus Heme: negative for easy bruising and gum/nose bleeding MS: General weakness  Neurolo: Confusion Psych: anxiety, depression  Endocrine: negative for thyroid, diabetes Allergy/Immunology- as above Objective:  VITALS:  BP (!) 81/48 (BP Location: Left Arm)   Pulse (!) 122   Temp 98.9 F (37.2 C) (Oral)   Resp 20   Ht '5\' 2"'$  (1.575 m)   Wt 80 kg   SpO2 100%   BMI 32.26 kg/m  LDA Port PHYSICAL EXAM:  General: Awake, but in respiratory distress, has muscular twitching and myoclonic jerks of upper extremities  Oral head: Normocephalic, without obvious abnormality, atraumatic. Eyes: Conjunctivae clear, anicteric sclerae. Pupils are equal Swelling left side of the face ENT Nares normal. No drainage or sinus tenderness. Oral Cavity thrush seen Neck: Left side of the neck has a nodular fungating mass below the ear    back: No CVA tenderness. Lungs: Bilateral air entry.  Crypts bases.   Heart: Tachycardia Abdomen: Soft.  PEG in place  extremities: atraumatic, no cyanosis. No edema. No clubbing Skin: No rashes or lesions. Or bruising Lymph: Cervical, supraclavicular normal. Neurologic: Cannot examine in detail Has myoclonic jerks  pertinent Labs Lab Results CBC    Component Value Date/Time   WBC 24.6 (H) 06/05/2022 0510   RBC 2.51 (L) 06/05/2022 0510   HGB 7.9 (L) 06/05/2022 0510   HGB 11.9 (L) 08/31/2013 0436   HCT 24.7 (L) 06/05/2022 0510   HCT 36.8 08/31/2013 0436   PLT 419 (H) 06/05/2022 0510   PLT 467 (H) 08/31/2013 0436   MCV 98.4 06/05/2022 0510   MCV 81 08/31/2013 0436   MCH 31.5 06/05/2022 0510   MCHC  32.0 06/05/2022 0510   RDW 14.1 06/05/2022 0510   RDW 19.9 (H) 08/31/2013 0436   LYMPHSABS 0.7 06/04/2022 0020   MONOABS 1.8 (H) 06/04/2022 0020   EOSABS 0.0 06/04/2022 0020   BASOSABS 0.0 06/04/2022 0020       Latest Ref Rng & Units 06/05/2022    5:10 AM 06/04/2022    8:52 AM 06/03/2022    9:25 PM  CMP  Glucose 70 - 99 mg/dL 112  113  111   BUN 8 - 23 mg/dL 5  <5  6   Creatinine 0.44 - 1.00 mg/dL 0.54  0.53  0.51   Sodium 135 - 145 mmol/L 139  135  133   Potassium 3.5 - 5.1 mmol/L 4.5  2.9  3.2   Chloride 98 - 111 mmol/L 100  95  95   CO2 22 - 32 mmol/L '30  27  26   '$ Calcium 8.9 - 10.3 mg/dL 8.6  9.3  8.6   Total Protein 6.5 - 8.1 g/dL   6.1   Total Bilirubin 0.3 - 1.2 mg/dL   0.5   Alkaline Phos 38 - 126 U/L   106   AST 15 - 41 U/L   18   ALT 0 - 44 U/L   9       Microbiology: Recent Results (from the past 240 hour(s))  Blood Culture (routine x 2)     Status: None (Preliminary result)   Collection Time: 06/03/22  9:24 PM   Specimen: BLOOD  Result Value Ref Range Status   Specimen Description BLOOD BLOOD LEFT FOREARM  Final   Special Requests   Final  BOTTLES DRAWN AEROBIC AND ANAEROBIC Blood Culture adequate volume   Culture   Final    NO GROWTH 2 DAYS Performed at [facility], [address] [place], [place] [address]    Report Status PENDING  Incomplete  Blood Culture (routine x 2)     Status: None (Preliminary result)   Collection Time: 06/03/22  9:26 PM   Specimen: BLOOD  Result Value Ref Range Status   Specimen Description BLOOD BLOOD RIGHT FOREARM  Final   Special Requests   Final    BOTTLES DRAWN AEROBIC AND ANAEROBIC Blood Culture results may not be optimal due to an inadequate volume of blood received in culture bottles   Culture   Final    NO GROWTH 2 DAYS Performed at [facility], [address] [place], [place] [address]    Report Status PENDING  Incomplete  SARS Coronavirus 2 by RT PCR (hospital order, performed in [facility] hospital lab) *cepheid single result test*     Status: None   Collection Time: 06/04/22 12:20 AM   Specimen: Nasal Swab  Result Value Ref Range Status   SARS Coronavirus 2 by RT PCR NEGATIVE NEGATIVE Final    Comment: (NOTE) SARS-CoV-2 target nucleic acids are NOT DETECTED.  The SARS-CoV-2 RNA is generally detectable in upper and lower respiratory specimens during the acute phase of infection. The lowest concentration of SARS-CoV-2 viral copies this assay can detect is 250 copies / mL. A negative result does not preclude SARS-CoV-2 infection and should not be used as the sole basis for treatment or other patient management decisions.  A negative result may occur with improper specimen collection / handling, submission of specimen other than nasopharyngeal swab, presence of viral mutation(s) within the areas targeted by this assay, and inadequate number of viral copies (<250 copies / mL). A negative result must be combined with clinical observations, patient history, and epidemiological information.  Fact Sheet for Patients:   https://www.patel.info/  Fact Sheet for Healthcare Providers: https://hall.com/  This test is not yet approved or  cleared by the [place] FDA and has been authorized for detection and/or diagnosis of SARS-CoV-2 by FDA under an Emergency Use Authorization (EUA).  This EUA will remain in effect (meaning this test can be used) for the duration of the COVID-19 declaration under Section 564(b)(1) of the Act, 21 U.S.C. section 360bbb-3(b)(1), unless the authorization is terminated or revoked sooner.  Performed at [facility], [address] [place], [place] [address]     IMAGING RESULTS: CT neck CT of the neck showed unchanged appearance of a confluent nodal mass with area of central necrosis on the left side Persistent occlusion of the left internal jugular vein cutaneous lesion on the left  left retroauricular region.  And narrowing of the distal left ICA due to mass effect I have personally reviewed the films ? Impression/Recommendation ? 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  Also extension into soft tissue of the neck with obstruction of left IJ and also compression of the ICA. High risk for erosion into vessels, lemierre's syndrome, tumor extension into sigmoid sinus and cavernous sinus. Edema of the face and oral cavity secondary to the left IJ obstruction And has aspiration pneumonitis. Patient leukocytosis secondary to the fungating mass as well as aspiration. Change ceftriaxone to IV Zosyn.   Patient needs to be transferred to [facility] for further care  Acute hypoxic  respiratory failure secondary to the above  COPD  Current smoker  Myoclonic jerks with muscular twitching clear due to morphine. Check ABG to rule out CO2 retention  Anemia ? ___________________________________________________ Discussed with patient, and requesting provider Note:  This document was prepared using Dragon voice recognition software and may include unintentional dictation errors.

## 2022-06-05 NOTE — Progress Notes (Signed)
   06/05/22 0453  Assess: MEWS Score  Temp 99.2 F (37.3 C)  BP 106/72  MAP (mmHg) 81  Pulse Rate (!) 115  Resp 20  SpO2 98 %  O2 Device Nasal Cannula  O2 Flow Rate (L/min) 3 L/min  Assess: MEWS Score  MEWS Temp 0  MEWS Systolic 0  MEWS Pulse 2  MEWS RR 0  MEWS LOC 0  MEWS Score 2  MEWS Score Color Yellow  Assess: if the MEWS score is Yellow or Red  Were vital signs taken at a resting state? Yes  Focused Assessment No change from prior assessment  Does the patient meet 2 or more of the SIRS criteria? Yes  Does the patient have a confirmed or suspected source of infection? Yes  Provider and Rapid Response Notified? No  MEWS guidelines implemented *See Row Information* Yes  Treat  MEWS Interventions Administered scheduled meds/treatments  Pain Scale 0-10  Pain Score Asleep  Patients Stated Pain Goal 0  Take Vital Signs  Increase Vital Sign Frequency  Yellow: Q 2hr X 2 then Q 4hr X 2, if remains yellow, continue Q 4hrs  Escalate  MEWS: Escalate Yellow: discuss with charge nurse/RN and consider discussing with provider and RRT  Notify: Charge Nurse/RN  Name of Charge Nurse/RN Notified Agapito Games, RN  Date Charge Nurse/RN Notified 06/05/22  Time Charge Nurse/RN Notified 0500  Notify: Provider  Provider Name/Title Damita Dunnings, MD  Date Provider Notified 06/05/22  Time Provider Notified 0230 (First message about increased HR)  Method of Notification Page (secure chat)  Notification Reason Other (Comment) (increased HR)  Provider response See new orders (Fluid bolus given and then continuous fluids started)  Date of Provider Response 06/05/22  Time of Provider Response [phone]  Document  Patient Outcome Stabilized after interventions (HR actually improved since bolus given at 0239 and IV fluids started at 0436.)  Progress note created (see row info) Yes  Assess: SIRS CRITERIA  SIRS Temperature  0  SIRS Pulse 1  SIRS Respirations  0  SIRS WBC 1  SIRS Score Sum  2

## 2022-06-05 NOTE — Progress Notes (Signed)
Initial Nutrition Assessment  DOCUMENTATION CODES:   Underweight, Severe malnutrition in context of chronic illness  INTERVENTION:   -Liberalize diet to regular for widest variety of meal selections -MVI with minerals daily -Initiate bolus TF per home regimen:    237 ml (1 ARC carton) Osmolite 1.5 4 times daily via g-tube   45 ml Prosource TF BID.     If no IVFs, 75 ml free water flush before and after each feeding administration   Tube feeding regimen provides 1500 kcal (100% of needs), 82 grams of protein, and 724 ml of H2O.  Total free water: 1324 ml daily   -Monitor Mg, K, and Phos daily and replete as needed secondary to high refeeding risk  NUTRITION DIAGNOSIS:   Severe Malnutrition related to chronic illness (stage IV throat cancer) as evidenced by moderate fat depletion, severe fat depletion, moderate muscle depletion, severe muscle depletion.  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  Pt admitted with SIRS.   Reviewed I/O's: +889 ml x 24 hours and +2.3 L since admission  UOP: 300 ml x 24 hours  Pt with stage IV throat cancer and is followed by [facility]. Reviewed outpatient RD notes from earlier this year. Pt administers 1 carton of Osmolite 1.5 via PEG 4 times per day (1200. 1600, 2000, 0000). Pt refused continuous feedings at last outpatient RD visit and reports preference for bolus feedings. Pt takes PO's, but very little due to pain, consuming mostly bites of pudding, broth, and juice. She also take a MVI by mouth daily.   Pt  sitting up in bed, trying to eat breakfast this morning. Pt's speech very garbled and difficult to understand. Attempted to obtain history, but pt not very cooperative, stating "Get out of my face; I will cough on you". Pt continued to state that all she wanted to do was eat breakfast. Case discussed with RN, who reports pt's condition has improved yesterday, but still very gurgly.   Pt confirms use of feedings at home, however, unsure how compliant she is with TF given previous history (pt was not taking TF PTA during July admission). Prescribed feeding regimen is on carton of Osmolite 1.5 at 0800, 1200, 1600, and 2000, so she has time to watch her TV shows at night. Pt amenable to re-start home TF regimen.   Reviewed wt hx; will use admission wt of 42 kg, as this appears closer to pt's actual weight. Noted distant weight loss over the past 2.5 years. Per pt, her UBW is around 125#, but estimates she has lost about 55# over the past year and half gradually.   Case discussed with MD and received permission to start home TF regimen.   Medications reviewed and include potassium chloride and lactated ringers infusion @ 100 ml/hr.   Labs reviewed: CBGS: 142 (inpatient orders for glycemic control are none).    NUTRITION - FOCUSED PHYSICAL EXAM:  Flowsheet Row Most Recent Value  Orbital Region Moderate depletion  Upper Arm Region Severe depletion  Thoracic and Lumbar Region Severe depletion  Buccal Region Moderate depletion  Temple Region Severe  depletion  Clavicle Bone Region Severe depletion  Clavicle and Acromion Bone Region Severe depletion  Scapular Bone Region Severe depletion  Dorsal Hand Moderate depletion  Patellar Region Severe depletion  Anterior Thigh Region Severe depletion  Posterior Calf Region Severe depletion  Edema (RD Assessment) None  Hair Reviewed  Eyes Reviewed  Mouth Reviewed  Skin Reviewed  Nails Reviewed       Diet Order:   Diet Order             Diet  regular Room service appropriate? Yes; Fluid consistency: Thin  Diet effective now                   EDUCATION NEEDS:   Education needs have been addressed  Skin:  Skin Assessment: Reviewed RN Assessment  Last BM:  Unknown  Height:   Ht Readings from Last 1 Encounters:  06/04/22 '5\' 2"'$  (1.575 m)    Weight:   Wt Readings from Last 1 Encounters:  06/04/22 80 kg    Ideal Body Weight:  50 kg  BMI:  Body mass index is 32.26 kg/m.  Estimated Nutritional Needs:   Kcal:  1650-1850  Protein:  85-100 grams  Fluid:  > 1.6 L    Loistine Chance, RD, LDN, [place] [facility] Registered Dietitian II Certified Diabetes Care and Education Specialist Please refer to [facility] for RD and/or RD on-call/weekend/after hours pager

## 2022-06-05 NOTE — Care Plan (Signed)
Was asked to evaluate the patient for "hemoptysis".  Patient has extensive history of head and neck cancer and is undergoing treatment at [facility].  She had been admitted to [facility] previously for pneumonia however, her most recent x-ray here is clear.  She tells me that when she blows her nose she has blood-tinged mucus.  When she coughs some of it comes from her throat.  She has EXTENSIVE oropharyngeal squamous cell carcinoma.  I have reviewed the ENT notes and it is confirmed that this blood-tinged mucus is likely coming from her head and neck cancer.  The pulmonary standpoint there are no invasive procedures to perform.  A bronchoscopy could not be performed safely currently due to her extensive head and neck cancer.  In addition, there does not appear to be an indication for bronchoscopy at this time.  I agree with Dr. Tami Ribas that patient would benefit from transfer to [facility] for further treatment.  Defer management of the patient to ENT and hospitalist service.  Renold Don, MD Advanced Bronchoscopy PCCM [facility]

## 2022-06-06 ENCOUNTER — Inpatient Hospital Stay: Payer: Medicaid Other

## 2022-06-06 DIAGNOSIS — C760 Malignant neoplasm of head, face and neck: Secondary | ICD-10-CM

## 2022-06-06 DIAGNOSIS — R000 Tachycardia, unspecified: Secondary | ICD-10-CM

## 2022-06-06 DIAGNOSIS — I82C12 Acute embolism and thrombosis of left internal jugular vein: Secondary | ICD-10-CM | POA: Diagnosis not present

## 2022-06-06 DIAGNOSIS — J690 Pneumonitis due to inhalation of food and vomit: Secondary | ICD-10-CM | POA: Diagnosis not present

## 2022-06-06 DIAGNOSIS — R6510 Systemic inflammatory response syndrome (SIRS) of non-infectious origin without acute organ dysfunction: Secondary | ICD-10-CM | POA: Diagnosis not present

## 2022-06-06 LAB — COMPREHENSIVE METABOLIC PANEL
ALT: 9 U/L (ref 0–44)
AST: 15 U/L (ref 15–41)
Albumin: 2.1 g/dL — ABNORMAL LOW (ref 3.5–5.0)
Alkaline Phosphatase: 92 U/L (ref 38–126)
Anion gap: 6 (ref 5–15)
BUN: 9 mg/dL (ref 8–23)
CO2: 29 mmol/L (ref 22–32)
Calcium: 8.5 mg/dL — ABNORMAL LOW (ref 8.9–10.3)
Chloride: 100 mmol/L (ref 98–111)
Creatinine, Ser: 0.57 mg/dL (ref 0.44–1.00)
GFR, Estimated: >60 mL/min (ref >60)
Glucose, Bld: 131 mg/dL — ABNORMAL HIGH (ref 70–99)
Potassium: 5 mmol/L (ref 3.5–5.1)
Sodium: 135 mmol/L (ref 135–145)
Total Bilirubin: 0.3 mg/dL (ref 0.3–1.2)
Total Protein: 5.6 g/dL — ABNORMAL LOW (ref 6.5–8.1)

## 2022-06-06 LAB — CBC
HCT: 24.7 % — ABNORMAL LOW (ref 36.0–46.0)
Hemoglobin: 7.7 g/dL — ABNORMAL LOW (ref 12.0–15.0)
MCH: 31.3 pg (ref 26.0–34.0)
MCHC: 31.2 g/dL (ref 30.0–36.0)
MCV: 100.4 fL — ABNORMAL HIGH (ref 80.0–100.0)
Platelets: 367 10*3/uL (ref 150–400)
RBC: 2.46 MIL/uL — ABNORMAL LOW (ref 3.87–5.11)
RDW: 14.2 % (ref 11.5–15.5)
WBC: 18.8 10*3/uL — ABNORMAL HIGH (ref 4.0–10.5)
nRBC: 0 % (ref 0.0–0.2)

## 2022-06-06 LAB — HEMOGLOBIN AND HEMATOCRIT, BLOOD
HCT: 27.9 % — ABNORMAL LOW (ref 36.0–46.0)
Hemoglobin: 9.2 g/dL — ABNORMAL LOW (ref 12.0–15.0)

## 2022-06-06 LAB — GLUCOSE, CAPILLARY
Glucose-Capillary: 119 mg/dL — ABNORMAL HIGH (ref 70–99)
Glucose-Capillary: 130 mg/dL — ABNORMAL HIGH (ref 70–99)
Glucose-Capillary: 132 mg/dL — ABNORMAL HIGH (ref 70–99)
Glucose-Capillary: 153 mg/dL — ABNORMAL HIGH (ref 70–99)
Glucose-Capillary: 176 mg/dL — ABNORMAL HIGH (ref 70–99)
Glucose-Capillary: 181 mg/dL — ABNORMAL HIGH (ref 70–99)

## 2022-06-06 LAB — MAGNESIUM
Magnesium: 1.6 mg/dL — ABNORMAL LOW (ref 1.7–2.4)
Magnesium: 1.6 mg/dL — ABNORMAL LOW (ref 1.7–2.4)

## 2022-06-06 LAB — PHOSPHORUS
Phosphorus: 2 mg/dL — ABNORMAL LOW (ref 2.5–4.6)
Phosphorus: 5.3 mg/dL — ABNORMAL HIGH (ref 2.5–4.6)

## 2022-06-06 LAB — ABO/RH: ABO/RH(D): B POS

## 2022-06-06 LAB — PREPARE RBC (CROSSMATCH)

## 2022-06-06 MED ORDER — MAGNESIUM SULFATE 2 GM/50ML IV SOLN
2.0000 g | Freq: Once | INTRAVENOUS | Status: AC
  Administered 2022-06-06: 2 g via INTRAVENOUS
  Filled 2022-06-06: qty 50

## 2022-06-06 MED ORDER — SODIUM CHLORIDE 0.9% IV SOLUTION
Freq: Once | INTRAVENOUS | Status: AC
  Administered 2022-06-06: 10 mL via INTRAVENOUS

## 2022-06-06 MED ORDER — DIPHENHYDRAMINE HCL 50 MG/ML IJ SOLN
25.0000 mg | Freq: Once | INTRAMUSCULAR | Status: AC
  Administered 2022-06-06: 25 mg via INTRAVENOUS
  Filled 2022-06-06: qty 1

## 2022-06-06 MED ORDER — ADULT MULTIVITAMIN LIQUID CH
15.0000 mL | Freq: Every day | ORAL | Status: DC
  Administered 2022-06-07 – 2022-06-12 (×5): 15 mL
  Filled 2022-06-06 (×8): qty 15

## 2022-06-06 MED ORDER — SODIUM PHOSPHATES 45 MMOLE/15ML IV SOLN
30.0000 mmol | Freq: Once | INTRAVENOUS | Status: AC
  Administered 2022-06-06: 30 mmol via INTRAVENOUS
  Filled 2022-06-06: qty 10

## 2022-06-06 NOTE — Progress Notes (Incomplete)
ID Pt more alert today No as twitching  BP (!) 98/58 (BP Location: Right Arm)   Pulse (!) 117   Temp 99.1 F (37.3 C) (Oral)   Resp 18   Ht '5\' 2"'$  (1.575 m)   Wt 58.5 kg   SpO2 99%   BMI 23.58 kg/m   Chest b/l air entry Rhonchi [id number] Tachycardia Left neck fungating mass  Labs    Latest Ref Rng & Units 06/06/2022    5:15 PM 06/06/2022    4:49 AM 06/05/2022    8:31 AM  CBC  WBC 4.0 - 10.5 K/uL  18.8    Hemoglobin 12.0 - 15.0 g/dL 9.2  7.7  8.0   Hematocrit 36.0 - 46.0 % 27.9  24.7  24.7   Platelets 150 - 400 K/uL  367         Latest Ref Rng & Units 06/06/2022    4:49 AM 06/05/2022    5:10 AM 06/04/2022    8:52 AM  CMP  Glucose 70 - 99 mg/dL 131  112  113   BUN 8 - 23 mg/dL 9  5  <5   Creatinine 0.44 - 1.00 mg/dL 0.57  0.54  0.53   Sodium 135 - 145 mmol/L 135  139  135   Potassium 3.5 - 5.1 mmol/L 5.0  4.5  2.9   Chloride 98 - 111 mmol/L 100  100  95   CO2 22 - 32 mmol/L '29  30  27   '$ Calcium 8.9 - 10.3 mg/dL 8.5  8.6  9.3   Total Protein 6.5 - 8.1 g/dL 5.6     Total Bilirubin 0.3 - 1.2 mg/dL 0.3     Alkaline Phos 38 - 126 U/L 92     AST 15 - 41 U/L 15     ALT 0 - 44 U/L 9        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  Also extension into soft tissue of the neck with obstruction of left IJ and also compression of the ICA. High risk for erosion into vessels, lemierre's syndrome, tumor extension into sigmoid sinus and cavernous sinus. Edema of the face and oral cavity secondary to the left IJ obstruction And has aspiration pneumonitis. Patient leukocytosis secondary to the fungating mass as well as aspiration. Change ceftriaxone to IV Zosyn.    Patient needs to be transferred to [facility] for further care   Acute hypoxic respiratory failure secondary to the above   COPD  Current smoker  Myoclonic jerks with muscular twitching clear due to morphine. Check ABG to rule out CO2  retention  Anemia

## 2022-06-06 NOTE — TOC Progression Note (Addendum)
Transition of Care (TOC) - Progression Note    Patient Details  Name: Christina Porter MRN: [id number] Date of Birth: 1961/06/25  Transition of Care [facility]) CM/SW Contact  Beverly Sessions, RN Phone Number: 06/06/2022, 12:30 PM  Clinical Narrative:     Patient does not oxygen through [facility], [facility], [facility], or [name] home care specialist  Spoke with son, he does not know who O2 is with.  He confirms he can bring a tank for discharge.   Per [facility] home care specialist they only have her for tube feeds    Update: Per MD pending transfer to [facility] Expected Discharge Plan: Home/Self Care Barriers to Discharge: Continued Medical Work up  Expected Discharge Plan and Services Expected Discharge Plan: Home/Self Care       Living arrangements for the past 2 months: Apartment                                       Social Determinants of Health (SDOH) Interventions    Readmission Risk Interventions     No data to display

## 2022-06-06 NOTE — Discharge Summary (Signed)
Physician Discharge Summary  Aleksa R. Kierston Plasencia JJH:[id number] DOB: 11/14/1960 DOA: 06/03/2022  PCP: Neomia Dear, MD  Admit date: 06/03/2022  Discharge date: 06/06/2022  Admitted From: Home Disposition: Transfer to [facility] for further care  Recommendations for Outpatient Follow-up:  Follow up with PCP in 1-2 weeks Please obtain BMP/CBC in one week Patient is being transferred to [facility] for further care.  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  She presented in the ED in [place] where she presented with chest congestion, shortness of breath and hemoptysis.  CTA chest was negative for PE and admission was advised but patient left AMA.  She presented in the [facility] ED with c/o: disorientation, hypertension. CT head left retroauricular soft tissue mass better assessed on CT soft tissue neck.   Patient was admitted for sepsis secondary to necrotic left mass. She was started on IV fluids and IV antibiotics. Infectious diseases consulted.  Since patient follows up with Oncology at [facility] , attempt was made to transfer the patient to [facility] but no bed available.  ENT, Pulmonology, oncology consulted for hemoptysis.  All recommended transfer to [facility].  No bed available yet.  Palliative care consulted to discuss goals of care.   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  Lactic acid 0.8, UA : not significantly abnormal. Continue IV fluids as per sepsis protocol. Patient initiated on IV ceftriaxone for possible UTI. Infectious disease consulted, recommended changing antibiotics to IV Zosyn. Follow-up cultures.  ID recommended transfer to [facility] for further care.   Internal jugular vein thrombosis left: Patient was hospitalized from 7/17 to 7/21 with concern for septic thrombophlebitis. Cultures were negative.  Patient was treated with Unasyn and Augmentin to complete 2 weeks course. Follow-up repeat blood culture.   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   PET scan 1216/2012 was notable for local metastatic disease. Patient follows with Dr. Harriette Ohara at [facility].  S/p chemo ( Cisplatin) Recently contacted by radiation oncology Dr. Audelia Acton they are trying to reschedule steroid injection and simulation appointments,  she has missed prior appointments. Patient at high risk for erosion into vessels, Lemierre's syndrome, tumor extension into sigmoid sinus and cavernous sinus. Spoke with oncologist Dr. Harriette Ohara at [facility], he agreed with the transfer but there is no bed available at this point.  We will follow-up on transfer.   Hemoptysis: Patient has extensive history of head and  neck cancer and is undergoing treatment at [facility]. Blood-tinged mucus is coming from her  cancer.  ENT and pulmonology evaluation requested. Patient is not having any active bleeding, however she has extensive disease in the neck and she is at risk for acute arterial bleeding from tumor invasion and tumor necrosis from treatment. ENT recommended transfer to [facility] for further care.     Possible seizure disorder : Continue gabapentin.   Cancer related pain/chronic pain: Continue oxycodone as needed   Hyponatremia: Resolved with IV hydration.   Tobacco abuse: Continue nicotine patch.   UTI: Continue Zosyn.   Underweight: BMI 16.94 suspect severe protein calorie malnutrition Nutritionist evaluation   Discharge Instructions Patient is being transferred to [facility] for further care.  Allergies as of 06/06/2022       Reactions   Benadryl [diphenhydramine] Other (See Comments)   Somnolence when given 25 mg IV; consider low dose administration     Med Rec must be completed prior to using this SMARTLINK       Allergies  Allergen Reactions   Benadryl [Diphenhydramine] Other (See Comments)    Somnolence when given 25 mg IV; consider low dose administration    Consultations: Oncology Pulmonology ENT Palliative care   Procedures/Studies: DG Chest Port 1 View  Result Date: 06/06/2022 CLINICAL DATA:  61 year old female with hypoxia.  History of cancer. EXAM: PORTABLE CHEST 1 VIEW COMPARISON:  Portable chest 06/03/2022 and earlier. FINDINGS: Portable AP upright view at 0424 hours. Stable right chest power port. Mildly lower lung volumes. Streaky and veiling new right lung base opacity. No pneumothorax. No pulmonary edema suspected. Left lung appears stable and negative. Percutaneous gastrostomy tube visible with negative upper abdominal bowel gas pattern. No acute osseous abnormality identified. IMPRESSION: New right lung base opacity which appears to be a combination of airspace disease and small new pleural effusion. Consider aspiration and/or pneumonia. Electronically  Signed   By: Genevie Ann M.D.   On: 06/06/2022 04:36   CT Soft Tissue Neck W Contrast  Result Date: 06/04/2022 CLINICAL DATA:  Hematologic malignancy EXAM: CT NECK WITH CONTRAST TECHNIQUE: Multidetector CT imaging of the neck was performed using the standard protocol following the bolus administration of intravenous contrast. RADIATION DOSE REDUCTION: This exam was performed according to the departmental dose-optimization program which includes automated exposure control, adjustment of the mA and/or kV according to patient size and/or use of iterative reconstruction technique. CONTRAST:  48m OMNIPAQUE IOHEXOL 350 MG/ML SOLN COMPARISON:  05/15/2022 FINDINGS: PHARYNX AND LARYNX: Rightward deviation of the pharynx and upper larynx is unchanged. There is persistent left asymmetric soft tissue thickening at the larynx. The airway is maintained. Post treatment thickening of the epiglottis. Unchanged small retropharyngeal effusion. SALIVARY GLANDS: Post treatment hyperenhancement of the salivary glands is unchanged. THYROID: Normal. LYMPH NODES: Conglomerate nodal mass at left level 2A with areas of central necrosis is unchanged allowing for differences in scan quality and contrast bolus timing. 5 mm right level 1A node is also unchanged. No new lymphadenopathy. VASCULAR: There is a right chest wall Port-A-Cath the terminates below the field of view. The left internal jugular vein remains occluded. There is narrowing of the distal left ICA due to mass effect by the surrounding soft tissues, unchanged. LIMITED INTRACRANIAL: Normal. VISUALIZED ORBITS: Normal. MASTOIDS AND VISUALIZED PARANASAL SINUSES: No fluid levels or advanced mucosal thickening. No mastoid effusion. SKELETON: No bony spinal canal stenosis. No lytic or blastic lesions. UPPER CHEST: Clear. OTHER: Unchanged cutaneous lesion of the left retroauricular region. IMPRESSION: 1.  Unchanged appearance of confluent nodal mass at left level 2A with areas of central  necrosis. 2. Unchanged 5 mm right level 1A node. 3. Unchanged cutaneous lesion of the left retroauricular region. 4. 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 Electronically Signed   By: Ulyses Jarred M.D.   On: 06/04/2022 00:18   CT HEAD WO CONTRAST (5MM)  Result Date: 06/03/2022 CLINICAL DATA:  Altered mental status, head and neck carcinoma EXAM: CT HEAD WITHOUT CONTRAST TECHNIQUE: Contiguous axial images were obtained from the base of the skull through the vertex without intravenous contrast. RADIATION DOSE REDUCTION: This exam was performed according to the departmental dose-optimization program which includes automated exposure control, adjustment of the mA and/or kV according to patient size and/or use of iterative reconstruction technique. COMPARISON:  MRI 05/16/2022 FINDINGS: Brain: Normal anatomic configuration. Parenchymal volume loss is commensurate with the patient's age. Mild periventricular white matter changes are present likely reflecting the sequela of small vessel ischemia. No abnormal intra or extra-axial mass lesion or fluid collection. No abnormal mass effect or midline shift. No evidence of acute intracranial hemorrhage or infarct. Ventricular size is normal. Cerebellum unremarkable. Vascular: No asymmetric hyperdense vasculature at the skull base. Skull: Intact Sinuses/Orbits: Paranasal sinuses are clear. Orbits are unremarkable. Other: Mastoid air cells and middle ear cavities are clear. Left retroauricular soft tissue mass is partially visualized at the inferior margin of the examination, better assessed on CT examination of the neck on 05/15/2022. IMPRESSION: 1. No acute intracranial abnormality. 2. Left retroauricular soft tissue mass partially visualized, better assessed on CT examination of the neck on 05/15/2022. Electronically Signed   By: Fidela Salisbury M.D.   On: 06/03/2022 22:12   DG Chest Port 1 View  Result Date: 06/03/2022 CLINICAL DATA:  Questionable sepsis  EXAM: PORTABLE CHEST 1 VIEW COMPARISON:  05/15/2022 FINDINGS: The heart size and mediastinal contours are within normal limits. Right chest port catheter. Both lungs are clear. The visualized skeletal structures are unremarkable. IMPRESSION: No acute abnormality of the lungs in AP portable projection. Electronically Signed   By: Delanna Ahmadi M.D.   On: 06/03/2022 21:20   MR Brain W and Wo Contrast  Result Date: 05/16/2022 CLINICAL DATA:  Seizures and balance loss. History of head neck carcinoma. EXAM: MRI HEAD WITHOUT AND WITH CONTRAST TECHNIQUE: Multiplanar, multiecho pulse sequences of the brain and surrounding structures were obtained without and with intravenous contrast. CONTRAST:  30m GADAVIST GADOBUTROL 1 MMOL/ML IV SOLN COMPARISON:  None Available. FINDINGS: Brain: No acute infarct, mass effect or extra-axial collection. No acute or chronic hemorrhage. Normal white matter signal, parenchymal volume and CSF spaces. The midline structures are normal. Vascular: Major flow voids are preserved. Skull and upper cervical spine: Findings within the soft tissues of the left neck are more completely described on the concomitant CT of the neck. Briefly, there is confluent lymphadenopathy with central necrosis and a postauricular skin lesion. Normal calvarium. Sinuses/Orbits:No paranasal sinus fluid levels or advanced mucosal thickening. No mastoid or middle ear effusion. Normal orbits. IMPRESSION: 1. Normal brain.  No intracranial metastatic disease. 2. Findings of disease spread within the soft tissues of the left neck are more completely described on the concomitant CT of the neck. Electronically Signed   By: KUlyses JarredM.D.   On: 05/16/2022 01:04   CT Soft Tissue Neck W Contrast  Result Date: 05/15/2022 CLINICAL DATA:  Soft tissue swelling. History of head neck carcinoma. EXAM: CT NECK WITH CONTRAST TECHNIQUE: Multidetector CT imaging of the  neck was performed using the standard protocol following the  bolus administration of intravenous contrast. RADIATION DOSE REDUCTION: This exam was performed according to the departmental dose-optimization program which includes automated exposure control, adjustment of the mA and/or kV according to patient size and/or use of iterative reconstruction technique. CONTRAST:  73m OMNIPAQUE IOHEXOL 300 MG/ML  SOLN COMPARISON:  None Available. FINDINGS: PHARYNX AND LARYNX: Diffuse edema of the soft tissues of the oropharynx, hypopharynx and larynx, including the epiglottis. There is a small retropharyngeal effusion but no retropharyngeal or peritonsillar abscess. SALIVARY GLANDS: Increased salivary gland enhancement is likely a post treatment effect. THYROID: Normal. LYMPH NODES: Left level 2A nodal conglomerate with area of central necrosis that measures approximately 1.4 cm. There is an enhancing 5 mm right level 1A node. VASCULAR: The left internal jugular vein is thrombosed. There is abnormal contrast enhancement surrounding its course in the left neck, just distal to the carotid bifurcation. The other major vessels are patent. There is a right chest wall Port-A-Cath with its tip below the field of view. LIMITED INTRACRANIAL: Normal. VISUALIZED ORBITS: Normal. MASTOIDS AND VISUALIZED PARANASAL SINUSES: No fluid levels or advanced mucosal thickening. No mastoid effusion. SKELETON: No bony spinal canal stenosis. No lytic or blastic lesions. UPPER CHEST: Clear. OTHER: There is a left retro auricular skin lesion measuring 2.5 x 1.3 cm. IMPRESSION: 1. Area of abnormal contrast enhancement at left level 2A, likely nodal conglomerate with areas of necrosis. There is likely extranodal extension with a cutaneous lesion of the retroauricular soft tissue. 2. Diffuse edema of the soft tissues of the oropharynx, hypopharynx and larynx, likely sequela of radiation therapy. 3. 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 Electronically Signed   By: KUlyses JarredM.D.   On: 05/15/2022 23:03    DG Chest 2 View  Result Date: 05/15/2022 CLINICAL DATA:  Weakness EXAM: CHEST - 2 VIEW COMPARISON:  Chest radiograph 08/31/2013 FINDINGS: A right chest wall port is in place with tip terminating in the right atrium. The cardiomediastinal silhouette is normal. There is no focal consolidation or pulmonary edema. There is no pleural effusion or pneumothorax There is no acute osseous abnormality. IMPRESSION: No radiographic evidence of acute cardiopulmonary process. Electronically Signed   By: PValetta MoleM.D.   On: 05/15/2022 18:19   CT Head Wo Contrast  Result Date: 05/15/2022 CLINICAL DATA:  Slurred speech facial droop multiple falls EXAM: CT HEAD WITHOUT CONTRAST CT CERVICAL SPINE WITHOUT CONTRAST TECHNIQUE: Multidetector CT imaging of the head and cervical spine was performed following the standard protocol without intravenous contrast. Multiplanar CT image reconstructions of the cervical spine were also generated. RADIATION DOSE REDUCTION: This exam was performed according to the departmental dose-optimization program which includes automated exposure control, adjustment of the mA and/or kV according to patient size and/or use of iterative reconstruction technique. COMPARISON:  None Available. FINDINGS: CT HEAD FINDINGS Brain: No evidence of acute infarction, hemorrhage, cerebral edema, mass, mass effect, or midline shift. No hydrocephalus or extra-axial fluid collection. Vascular: No hyperdense vessel. Skull: Normal. Negative for fracture or focal lesion. Sinuses/Orbits: Mild mucosal thickening in the maxillary sinuses. The orbits are unremarkable. Other: The mastoid air cells are well aerated. CT CERVICAL SPINE FINDINGS Alignment: Reversal of the normal cervical lordosis. No traumatic listhesis. Skull base and vertebrae: No acute fracture or suspicious osseous lesion. Soft tissues and spinal canal: No prevertebral fluid or swelling. No visible canal hematoma. Disc levels: No high-grade spinal canal  stenosis or neural foraminal narrowing. Upper chest: No focal  pulmonary opacity or pleural effusion. Other: Evaluation of the neck soft tissues is limited by the absence of intravenous contrast. Within this limitation, abnormal soft tissue in the left parapharyngeal space, in the region of left level 2A lymph nodes, extending into the left paravertebral musculature, and an exophytic lesion posterior to the left ear are favored to be related to the patient's known throat cancer. IMPRESSION: 1.  No acute intracranial process. 2.  No acute fracture or traumatic listhesis in the cervical spine. 3. Evaluation of the neck soft tissues is limited by the absence of intravenous contrast. Within this limitation, there are changes in the left neck that are likely related to the patient's throat cancer. Electronically Signed   By: Merilyn Baba M.D.   On: 05/15/2022 18:13   CT Cervical Spine Wo Contrast  Result Date: 05/15/2022 CLINICAL DATA:  Slurred speech facial droop multiple falls EXAM: CT HEAD WITHOUT CONTRAST CT CERVICAL SPINE WITHOUT CONTRAST TECHNIQUE: Multidetector CT imaging of the head and cervical spine was performed following the standard protocol without intravenous contrast. Multiplanar CT image reconstructions of the cervical spine were also generated. RADIATION DOSE REDUCTION: This exam was performed according to the departmental dose-optimization program which includes automated exposure control, adjustment of the mA and/or kV according to patient size and/or use of iterative reconstruction technique. COMPARISON:  None Available. FINDINGS: CT HEAD FINDINGS Brain: No evidence of acute infarction, hemorrhage, cerebral edema, mass, mass effect, or midline shift. No hydrocephalus or extra-axial fluid collection. Vascular: No hyperdense vessel. Skull: Normal. Negative for fracture or focal lesion. Sinuses/Orbits: Mild mucosal thickening in the maxillary sinuses. The orbits are unremarkable. Other: The mastoid  air cells are well aerated. CT CERVICAL SPINE FINDINGS Alignment: Reversal of the normal cervical lordosis. No traumatic listhesis. Skull base and vertebrae: No acute fracture or suspicious osseous lesion. Soft tissues and spinal canal: No prevertebral fluid or swelling. No visible canal hematoma. Disc levels: No high-grade spinal canal stenosis or neural foraminal narrowing. Upper chest: No focal pulmonary opacity or pleural effusion. Other: Evaluation of the neck soft tissues is limited by the absence of intravenous contrast. Within this limitation, abnormal soft tissue in the left parapharyngeal space, in the region of left level 2A lymph nodes, extending into the left paravertebral musculature, and an exophytic lesion posterior to the left ear are favored to be related to the patient's known throat cancer. IMPRESSION: 1.  No acute intracranial process. 2.  No acute fracture or traumatic listhesis in the cervical spine. 3. Evaluation of the neck soft tissues is limited by the absence of intravenous contrast. Within this limitation, there are changes in the left neck that are likely related to the patient's throat cancer. Electronically Signed   By: Merilyn Baba M.D.   On: 05/15/2022 18:13   (Echo, Carotid, EGD, Colonoscopy, ERCP)    Subjective: Patient was seen and examined at bedside.  Patient is frail, very deconditioned, remains on 2 L of oxygen.  Discharge Exam: Vitals:   06/06/22 1315 06/06/22 1730  BP: 126/83 101/81  Pulse: (!) 117 (!) 122  Resp: 20   Temp: 98.2 F (36.8 C) (!) 100.4 F (38 C)  SpO2: 100% 94%   Vitals:   06/06/22 1103 06/06/22 1239 06/06/22 1315 06/06/22 1730  BP: 100/75 115/71 126/83 101/81  Pulse: (!) 125 (!) 126 (!) 117 (!) 122  Resp: '18 18 20   '$ Temp: 98.2 F (36.8 C) 97.8 F (36.6 C) 98.2 F (36.8 C) (!) 100.4 F (38 C)  TempSrc: Axillary Oral Axillary Oral  SpO2: 100% 100% 100% 94%  Weight:      Height:        General: Frail, deconditioned, cachectic,  not in any distress.  On 2 L of oxygen Cardiovascular: RRR, S1/S2 +, no rubs, no gallops Respiratory: CTA bilaterally, no wheezing, no rhonchi Abdominal: Soft, NT, ND, bowel sounds + Extremities: no edema, no cyanosis    The results of significant diagnostics from this hospitalization (including imaging, microbiology, ancillary and laboratory) are listed below for reference.     Microbiology: Recent Results (from the past 240 hour(s))  Blood Culture (routine x 2)     Status: None (Preliminary result)   Collection Time: 06/03/22  9:24 PM   Specimen: BLOOD  Result Value Ref Range Status   Specimen Description BLOOD BLOOD LEFT FOREARM  Final   Special Requests   Final    BOTTLES DRAWN AEROBIC AND ANAEROBIC Blood Culture adequate volume   Culture   Final    NO GROWTH 3 DAYS Performed at [facility], [address] [place], [place] [address]    Report Status PENDING  Incomplete  Blood Culture (routine x 2)     Status: None (Preliminary result)   Collection Time: 06/03/22  9:26 PM   Specimen: BLOOD  Result Value Ref Range Status   Specimen Description BLOOD BLOOD RIGHT FOREARM  Final   Special Requests   Final    BOTTLES DRAWN AEROBIC AND ANAEROBIC Blood Culture results may not be optimal due to an inadequate volume of blood received in culture bottles   Culture   Final    NO GROWTH 3 DAYS Performed at [facility], [address], [place], [place] [address]    Report Status PENDING  Incomplete  Urine Culture     Status: None   Collection Time: 06/04/22 12:20 AM   Specimen: Urine, Random  Result Value Ref Range Status   Specimen Description   Final    URINE, RANDOM Performed at [facility], [address], [place], [place] [address]    Special Requests   Final    NONE Performed at [facility], [address] [place], [place] [address]    Culture   Final    NO GROWTH Performed at [facility], [place] [address],  [place], [place] [address]    Report Status 06/05/2022 FINAL  Final  SARS Coronavirus 2 by RT PCR (hospital order, performed in [facility] hospital lab) *cepheid single result test*     Status: None   Collection Time: 06/04/22 12:20 AM   Specimen: Nasal Swab  Result Value Ref Range Status   SARS Coronavirus 2 by RT PCR NEGATIVE NEGATIVE Final    Comment: (NOTE) SARS-CoV-2 target nucleic acids are NOT DETECTED.  The SARS-CoV-2 RNA is generally detectable in upper and lower respiratory specimens during the acute phase of infection. The lowest concentration of SARS-CoV-2 viral copies this assay can detect is 250 copies / mL. A negative result does not preclude SARS-CoV-2 infection and should not be used as the sole basis for treatment or other patient management decisions.  A negative result may occur with improper specimen collection / handling, submission of specimen other than nasopharyngeal swab, presence of viral mutation(s) within the areas targeted by this assay, and inadequate number of viral copies (<250 copies / mL). A negative result must be combined with clinical observations, patient history, and epidemiological information.  Fact Sheet for Patients:  https://www.patel.info/  Fact Sheet for Healthcare Providers: https://hall.com/  This test is not yet approved or  cleared by the [place] FDA and has been authorized for detection and/or diagnosis of SARS-CoV-2 by FDA under an Emergency Use Authorization (EUA).  This EUA will remain in effect (meaning this test can be used) for the duration of the COVID-19 declaration under Section 564(b)(1) of the Act, 21 U.S.C. section 360bbb-3(b)(1), unless the authorization is terminated or revoked sooner.  Performed at [facility], [address] [place], [place] [address]      Labs: BNP (last 3 results) No results for input(s): "BNP" in the last 8760 hours. Basic  Metabolic Panel: Recent Labs  Lab 06/03/22 2125 06/04/22 0852 06/05/22 0510 06/05/22 1815 06/05/22 2154 06/06/22 0449 06/06/22 1715  NA 133* 135 139  --   --  135  --   K 3.2* 2.9* 4.5  --   --  5.0  --   CL 95* 95* 100  --   --  100  --   CO2 '26 27 30  '$ --   --  29  --   GLUCOSE 111* 113* 112*  --   --  131*  --   BUN 6* <5* 5*  --   --  9  --   CREATININE 0.51 0.53 0.54  --   --  0.57  --   CALCIUM 8.6* 9.3 8.6*  --   --  8.5*  --   MG  --   --  1.2* 1.9 2.0 1.6* 1.6*  PHOS  --   --  3.0 1.8* 1.7* 2.0* 5.3*   Liver Function Tests: Recent Labs  Lab 06/03/22 2125 06/06/22 0449  AST 18 15  ALT 9 9  ALKPHOS 106 92  BILITOT 0.5 0.3  PROT 6.1* 5.6*  ALBUMIN 2.5* 2.1*   No results for input(s): "LIPASE", "AMYLASE" in the last 168 hours. No results for input(s): "AMMONIA" in the last 168 hours. CBC: Recent Labs  Lab 06/04/22 0020 06/04/22 0852 06/05/22 0510 06/05/22 0831 06/06/22 0449 06/06/22 1715  WBC 20.3* 19.7* 24.6*  --  18.8*  --   NEUTROABS 17.5*  --   --   --   --   --   HGB 9.8* 11.9* 7.9* 8.0* 7.7* 9.2*  HCT 29.9* 35.8* 24.7* 24.7* 24.7* 27.9*  MCV 96.5 96.0 98.4  --  100.4*  --   PLT 518* 471* 419*  --  367  --    Cardiac Enzymes: No results for input(s): "CKTOTAL", "CKMB", "CKMBINDEX", "TROPONINI" in the last 168 hours. BNP: Invalid input(s): "POCBNP" CBG: Recent Labs  Lab 06/05/22 2353 06/06/22 0402 06/06/22 0831 06/06/22 1246 06/06/22 1721  GLUCAP 165* 153* 132* 176* 181*   D-Dimer No results for input(s): "DDIMER" in the last 72 hours. Hgb A1c No results for input(s): "HGBA1C" in the last 72 hours. Lipid Profile No results for input(s): "CHOL", "HDL", "LDLCALC", "TRIG", "CHOLHDL", "LDLDIRECT" in the last 72 hours. Thyroid function studies No results for input(s): "TSH", "T4TOTAL", "T3FREE", "THYROIDAB" in the last 72 hours.  Invalid input(s): "FREET3" Anemia work up No results for input(s): "VITAMINB12", "FOLATE", "FERRITIN", "TIBC",  "IRON", "RETICCTPCT" in the last 72 hours. Urinalysis    Component Value Date/Time   COLORURINE YELLOW (A) 06/04/2022 0020   APPEARANCEUR CLEAR (A) 06/04/2022 0020   APPEARANCEUR Clear 08/31/2013 0312   LABSPEC 1.023 06/04/2022 0020   LABSPEC 1.003 08/31/2013 0312   PHURINE 8.0 06/04/2022 0020   GLUCOSEU NEGATIVE  06/04/2022 0020   GLUCOSEU Negative 08/31/2013 0312   HGBUR NEGATIVE 06/04/2022 0020   BILIRUBINUR NEGATIVE 06/04/2022 0020   BILIRUBINUR Negative 08/31/2013 0312   KETONESUR NEGATIVE 06/04/2022 0020   PROTEINUR NEGATIVE 06/04/2022 0020   NITRITE NEGATIVE 06/04/2022 0020   LEUKOCYTESUR SMALL (A) 06/04/2022 0020   LEUKOCYTESUR Negative 08/31/2013 0312   Sepsis Labs Recent Labs  Lab 06/04/22 0020 06/04/22 0852 06/05/22 0510 06/06/22 0449  WBC 20.3* 19.7* 24.6* 18.8*   Microbiology Recent Results (from the past 240 hour(s))  Blood Culture (routine x 2)     Status: None (Preliminary result)   Collection Time: 06/03/22  9:24 PM   Specimen: BLOOD  Result Value Ref Range Status   Specimen Description BLOOD BLOOD LEFT FOREARM  Final   Special Requests   Final    BOTTLES DRAWN AEROBIC AND ANAEROBIC Blood Culture adequate volume   Culture   Final    NO GROWTH 3 DAYS Performed at [facility], [address], [place], [place] [address]    Report Status PENDING  Incomplete  Blood Culture (routine x 2)     Status: None (Preliminary result)   Collection Time: 06/03/22  9:26 PM   Specimen: BLOOD  Result Value Ref Range Status   Specimen Description BLOOD BLOOD RIGHT FOREARM  Final   Special Requests   Final    BOTTLES DRAWN AEROBIC AND ANAEROBIC Blood Culture results may not be optimal due to an inadequate volume of blood received in culture bottles   Culture   Final    NO GROWTH 3 DAYS Performed at [facility], [address] [place], [place] [address]    Report Status PENDING  Incomplete  Urine Culture     Status: None   Collection Time:  06/04/22 12:20 AM   Specimen: Urine, Random  Result Value Ref Range Status   Specimen Description   Final    URINE, RANDOM Performed at [facility], [address], [place], [place] [address]    Special Requests   Final    NONE Performed at [facility], [address] [place], [place] [address]    Culture   Final    NO GROWTH Performed at [facility], [place] [address], [place], [place] [address]    Report Status 06/05/2022 FINAL  Final  SARS Coronavirus 2 by RT PCR (hospital order, performed in [facility] hospital lab) *cepheid single result test*     Status: None   Collection Time: 06/04/22 12:20 AM   Specimen: Nasal Swab  Result Value Ref Range Status   SARS Coronavirus 2 by RT PCR NEGATIVE NEGATIVE Final    Comment: (NOTE) SARS-CoV-2 target nucleic acids are NOT DETECTED.  The SARS-CoV-2 RNA is generally detectable in upper and lower respiratory specimens during the acute phase of infection. The lowest concentration of SARS-CoV-2 viral copies this assay can detect is 250 copies / mL. A negative result does not preclude SARS-CoV-2 infection and should not be used as the sole basis for treatment or other patient management decisions.  A negative result may occur with improper specimen collection / handling, submission of specimen other than nasopharyngeal swab, presence of viral mutation(s) within the areas targeted by this assay, and inadequate number of viral copies (<250 copies / mL). A negative result must be combined with clinical observations, patient history, and epidemiological information.  Fact Sheet for Patients:   https://www.patel.info/  Fact Sheet for Healthcare Providers: https://hall.com/  This test is not  yet approved or  cleared by the [place] and has been authorized for detection and/or diagnosis of SARS-CoV-2 by FDA under an Emergency Use Authorization (EUA).  This EUA  will remain in effect (meaning this test can be used) for the duration of the COVID-19 declaration under Section 564(b)(1) of the Act, 21 U.S.C. section 360bbb-3(b)(1), unless the authorization is terminated or revoked sooner.  Performed at [facility], [address], [place], [place] [address]      Time coordinating discharge: Over 30 minutes  SIGNED:   Shawna Clamp, MD  [facility] 06/06/2022, 5:42 PM Pager   If 7PM-7AM, please contact night-coverage

## 2022-06-06 NOTE — Consult Note (Signed)
Hematology/Oncology Consult note [phone] [phone]      Patient Care Team: Neomia Dear, MD as PCP - General (Family Medicine)   Name of the patient: Christina Porter  [id number]  09/10/1961   Date of visit: 06/06/22 REASON FOR COSULTATION:  Head and neck cancer, requested by Dr. Shawna Clamp History of presenting illness-  61 y.o. female who is currently admitted due to sepsis secondary to pneumonia, septic thrombophlebitis, possible aspiration. 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  Patient follows up with [facility] oncology and was last seen on 05/23/2022.  Patient is currently on an BTX 3 clinical trial and received NBTX3 on 05/30/2022 concurrently with radiation. Patient has had blood-tinged sputum. 06/04/2022 CT soft tissue neck with contrast showed confluent nodal mass at left level 2A with areas of central necrosis.5 mm right level 1A node, extranodal extension with cutaneous lesion of the left retroauricular region, occlusion of the left internal jugular vein.  Patient had episode of desaturation/tachycardia, somnolence this morning, was evaluated by rapid response provider.  Chest x-ray showed new right lung base opacity, combination of airspace disease and small new pleural effusion.  Consider aspiration pneumonia. Patient has n.p.o. for speech swallow.  Patient is on IV Zosyn. CBC today 06/06/2022 showed hemoglobin of 7.7, MCV 100.4.  WBC 18.8.  Patient is getting PRBC transfusion currently.  Patient is a poor historian.  She is not able to provide much history.  History was obtained from medical record reviewing.   Allergies  Allergen Reactions   Benadryl [Diphenhydramine] Other (See Comments)    Somnolence when given  25 mg IV; consider low dose administration    Patient Active Problem List   Diagnosis Date Noted   Hyponatremia 06/04/2022   SIRS (systemic inflammatory response syndrome) (HCC) 06/04/2022   Abnormal urinalysis 06/04/2022   Cancer associated pain 06/04/2022   Underweight 06/04/2022   Possible Seizure disorder ([place]) 05/16/2022   Bipolar 1 disorder ([place]) 05/16/2022   Tobacco abuse 05/16/2022   Throat cancer ([place]) 05/16/2022   Hypokalemia 05/16/2022   Protein-calorie malnutrition, severe 05/16/2022   Internal jugular vein thrombosis, 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/16/2022     Past Medical History:  Diagnosis Date   Bipolar 1 disorder (HCC)    Seizures (HCC)      Past Surgical History:  Procedure Laterality Date   CESAREAN SECTION      Social History   Socioeconomic History   Marital status: Married    Spouse name: Not on file   Number of children: Not on file   Years of education: Not on file   Highest education level: Not on file  Occupational History   Not on file  Tobacco Use   Smoking status: Every Day    Types: Cigarettes   Smokeless tobacco: Never  Vaping Use   Vaping Use: Never used  Substance and Sexual Activity   Alcohol use: Yes   Drug use: Never   Sexual activity: Not on file  Other Topics Concern   Not on file  Social History Narrative   ** Merged History Encounter **       Social Determinants of Health   Financial Resource Strain: Not on file  Food Insecurity: Not on file  Transportation Needs: Not on file  Physical Activity: Not on file  Stress: Not on file  Social  Connections: Not on file  Intimate Partner Violence: Not on file     Family History  Family history unknown: Yes     Current Facility-Administered Medications:    0.9 %  sodium chloride infusion (Manually program via Guardrails IV Fluids), , Intravenous, Once, Shawna Clamp, MD, Held at 06/06/22 1001   acetaminophen (TYLENOL) 160 MG/5ML solution 650 mg, 650 mg, Per Tube, Q6H PRN,  Athena Masse, MD   acetaminophen (TYLENOL) tablet 650 mg, 650 mg, Oral, Q6H PRN **OR** acetaminophen (TYLENOL) suppository 650 mg, 650 mg, Rectal, Q6H PRN, Athena Masse, MD   feeding supplement (OSMOLITE 1.5 CAL) liquid 237 mL, 237 mL, Per Tube, QID, Shawna Clamp, MD, 237 mL at 06/06/22 1016   feeding supplement (PROSource TF) liquid 45 mL, 45 mL, Per Tube, BID, Shawna Clamp, MD, 45 mL at 06/06/22 1016   fluconazole (DIFLUCAN) IVPB 200 mg, 200 mg, Intravenous, Q24H, Ravishankar, Jayashree, MD   gabapentin (NEURONTIN) 250 MG/5ML solution 600 mg, 600 mg, Per Tube, TID, Athena Masse, MD, 600 mg at 06/05/22 2206   heparin flush 10 UNIT/ML injection 10 Units, 10 Units, Intracatheter, Once, Fisher, Linden Dolin, PA-C   lactated ringers infusion, , Intravenous, Continuous, Athena Masse, MD, Last Rate: 100 mL/hr at 06/06/22 0150, New Bag at 06/06/22 0150   morphine 10 MG/5ML solution 10 mg, 10 mg, Per Tube, Q6H, Athena Masse, MD, 10 mg at 06/06/22 0022   multivitamin liquid 15 mL, 15 mL, Per Tube, Daily, Hallaji, Sheema M, RPH   ondansetron (ZOFRAN) tablet 4 mg, 4 mg, Oral, Q6H PRN **OR** ondansetron (ZOFRAN) injection 4 mg, 4 mg, Intravenous, Q6H PRN, Athena Masse, MD   oxyCODONE (ROXICODONE) 5 MG/5ML solution 5 mg, 5 mg, Per Tube, Q4H PRN, Judd Gaudier V, MD, 5 mg at 06/05/22 2206   piperacillin-tazobactam (ZOSYN) IVPB 3.375 g, 3.375 g, Intravenous, Q8H, Ravishankar, Jayashree, MD, Last Rate: 12.5 mL/hr at 06/06/22 0504, 3.375 g at 06/06/22 0504   QUEtiapine (SEROQUEL) tablet 100 mg, 100 mg, Per Tube, QHS, Judd Gaudier V, MD, 100 mg at 06/05/22 2201   sodium phosphate 30 mmol in dextrose 5 % 250 mL infusion, 30 mmol, Intravenous, Once, Shawna Clamp, MD, Last Rate: 43 mL/hr at 06/06/22 1115, 30 mmol at 06/06/22 1115   traZODone (DESYREL) tablet 100 mg, 100 mg, Per Tube, QHS, Athena Masse, MD, 100 mg at 06/05/22 2202  Review of Systems  Unable to perform ROS: Acuity of condition   HENT:          Left neck pain  Respiratory:  Positive for shortness of breath.        Blood-tinged mucus with coughing    Physical exam:  Vitals:   06/06/22 0820 06/06/22 0916 06/06/22 1023 06/06/22 1103  BP: (!) 100/55 101/89 111/63 100/75  Pulse: (!) 126 (!) 124 (!) 131 (!) 125  Resp: '18 18 18 18  '$ Temp: 98.4 F (36.9 C) 98.4 F (36.9 C) 98.6 F (37 C) 98.2 F (36.8 C)  TempSrc: Axillary Oral Oral Axillary  SpO2: 91% 97% 92% 100%  Weight:      Height:       Physical Exam Constitutional:      Appearance: She is ill-appearing.  Neck:     Comments: Left retroauricular cutaneous wound/drainage, covered with dressing.  Cardiovascular:     Rate and Rhythm: Tachycardia present.  Pulmonary:     Effort: Pulmonary effort is normal. No respiratory distress.     Breath sounds: Rhonchi  present.  Abdominal:     General: Abdomen is flat.  Musculoskeletal:        General: No swelling.  Lymphadenopathy:     Cervical: Cervical adenopathy present.  Skin:    General: Skin is warm.  Neurological:     Mental Status: She is alert. Mental status is at baseline.     Comments: Left facial droop     Labs    Latest Ref Rng & Units 06/06/2022    4:49 AM 06/05/2022    8:31 AM 06/05/2022    5:10 AM  CBC  WBC 4.0 - 10.5 K/uL 18.8   24.6   Hemoglobin 12.0 - 15.0 g/dL 7.7  8.0  7.9   Hematocrit 36.0 - 46.0 % 24.7  24.7  24.7   Platelets 150 - 400 K/uL 367   419       Latest Ref Rng & Units 06/06/2022    4:49 AM 06/05/2022    5:10 AM 06/04/2022    8:52 AM  CMP  Glucose 70 - 99 mg/dL 131  112  113   BUN 8 - 23 mg/dL 9  5  <5   Creatinine 0.44 - 1.00 mg/dL 0.57  0.54  0.53   Sodium 135 - 145 mmol/L 135  139  135   Potassium 3.5 - 5.1 mmol/L 5.0  4.5  2.9   Chloride 98 - 111 mmol/L 100  100  95   CO2 22 - 32 mmol/L '29  30  27   '$ Calcium 8.9 - 10.3 mg/dL 8.5  8.6  9.3   Total Protein 6.5 - 8.1 g/dL 5.6     Total Bilirubin 0.3 - 1.2 mg/dL 0.3     Alkaline Phos 38 - 126 U/L 92     AST 15 - 41  U/L 15     ALT 0 - 44 U/L 9             RADIOGRAPHIC STUDIES: I have personally reviewed the radiological images as listed and agreed with the findings in the report. DG Chest Port 1 View  Result Date: 06/06/2022 CLINICAL DATA:  61 year old female with hypoxia.  History of cancer. EXAM: PORTABLE CHEST 1 VIEW COMPARISON:  Portable chest 06/03/2022 and earlier. FINDINGS: Portable AP upright view at 0424 hours. Stable right chest power port. Mildly lower lung volumes. Streaky and veiling new right lung base opacity. No pneumothorax. No pulmonary edema suspected. Left lung appears stable and negative. Percutaneous gastrostomy tube visible with negative upper abdominal bowel gas pattern. No acute osseous abnormality identified. IMPRESSION: New right lung base opacity which appears to be a combination of airspace disease and small new pleural effusion. Consider aspiration and/or pneumonia. Electronically Signed   By: Genevie Ann M.D.   On: 06/06/2022 04:36   CT Soft Tissue Neck W Contrast  Result Date: 06/04/2022 CLINICAL DATA:  Hematologic malignancy EXAM: CT NECK WITH CONTRAST TECHNIQUE: Multidetector CT imaging of the neck was performed using the standard protocol following the bolus administration of intravenous contrast. RADIATION DOSE REDUCTION: This exam was performed according to the departmental dose-optimization program which includes automated exposure control, adjustment of the mA and/or kV according to patient size and/or use of iterative reconstruction technique. CONTRAST:  88m OMNIPAQUE IOHEXOL 350 MG/ML SOLN COMPARISON:  05/15/2022 FINDINGS: PHARYNX AND LARYNX: Rightward deviation of the pharynx and upper larynx is unchanged. There is persistent left asymmetric soft tissue thickening at the larynx. The airway is maintained. Post treatment thickening of the epiglottis. Unchanged small  retropharyngeal effusion. SALIVARY GLANDS: Post treatment hyperenhancement of the salivary glands is unchanged.  THYROID: Normal. LYMPH NODES: Conglomerate nodal mass at left level 2A with areas of central necrosis is unchanged allowing for differences in scan quality and contrast bolus timing. 5 mm right level 1A node is also unchanged. No new lymphadenopathy. VASCULAR: There is a right chest wall Port-A-Cath the terminates below the field of view. The left internal jugular vein remains occluded. There is narrowing of the distal left ICA due to mass effect by the surrounding soft tissues, unchanged. LIMITED INTRACRANIAL: Normal. VISUALIZED ORBITS: Normal. MASTOIDS AND VISUALIZED PARANASAL SINUSES: No fluid levels or advanced mucosal thickening. No mastoid effusion. SKELETON: No bony spinal canal stenosis. No lytic or blastic lesions. UPPER CHEST: Clear. OTHER: Unchanged cutaneous lesion of the left retroauricular region. IMPRESSION: 1. Unchanged appearance of confluent nodal mass at left level 2A with areas of central necrosis. 2. Unchanged 5 mm right level 1A node. 3. Unchanged cutaneous lesion of the left retroauricular region. 4. 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 Electronically Signed   By: Ulyses Jarred M.D.   On: 06/04/2022 00:18   CT HEAD WO CONTRAST (5MM)  Result Date: 06/03/2022 CLINICAL DATA:  Altered mental status, head and neck carcinoma EXAM: CT HEAD WITHOUT CONTRAST TECHNIQUE: Contiguous axial images were obtained from the base of the skull through the vertex without intravenous contrast. RADIATION DOSE REDUCTION: This exam was performed according to the departmental dose-optimization program which includes automated exposure control, adjustment of the mA and/or kV according to patient size and/or use of iterative reconstruction technique. COMPARISON:  MRI 05/16/2022 FINDINGS: Brain: Normal anatomic configuration. Parenchymal volume loss is commensurate with the patient's age. Mild periventricular white matter changes are present likely reflecting the sequela of small vessel ischemia. No  abnormal intra or extra-axial mass lesion or fluid collection. No abnormal mass effect or midline shift. No evidence of acute intracranial hemorrhage or infarct. Ventricular size is normal. Cerebellum unremarkable. Vascular: No asymmetric hyperdense vasculature at the skull base. Skull: Intact Sinuses/Orbits: Paranasal sinuses are clear. Orbits are unremarkable. Other: Mastoid air cells and middle ear cavities are clear. Left retroauricular soft tissue mass is partially visualized at the inferior margin of the examination, better assessed on CT examination of the neck on 05/15/2022. IMPRESSION: 1. No acute intracranial abnormality. 2. Left retroauricular soft tissue mass partially visualized, better assessed on CT examination of the neck on 05/15/2022. Electronically Signed   By: Fidela Salisbury M.D.   On: 06/03/2022 22:12   DG Chest Port 1 View  Result Date: 06/03/2022 CLINICAL DATA:  Questionable sepsis EXAM: PORTABLE CHEST 1 VIEW COMPARISON:  05/15/2022 FINDINGS: The heart size and mediastinal contours are within normal limits. Right chest port catheter. Both lungs are clear. The visualized skeletal structures are unremarkable. IMPRESSION: No acute abnormality of the lungs in AP portable projection. Electronically Signed   By: Delanna Ahmadi M.D.   On: 06/03/2022 21:20   MR Brain W and Wo Contrast  Result Date: 05/16/2022 CLINICAL DATA:  Seizures and balance loss. History of head neck carcinoma. EXAM: MRI HEAD WITHOUT AND WITH CONTRAST TECHNIQUE: Multiplanar, multiecho pulse sequences of the brain and surrounding structures were obtained without and with intravenous contrast. CONTRAST:  94m GADAVIST GADOBUTROL 1 MMOL/ML IV SOLN COMPARISON:  None Available. FINDINGS: Brain: No acute infarct, mass effect or extra-axial collection. No acute or chronic hemorrhage. Normal white matter signal, parenchymal volume and CSF spaces. The midline structures are normal. Vascular: Major flow voids are  preserved. Skull and  upper cervical spine: Findings within the soft tissues of the left neck are more completely described on the concomitant CT of the neck. Briefly, there is confluent lymphadenopathy with central necrosis and a postauricular skin lesion. Normal calvarium. Sinuses/Orbits:No paranasal sinus fluid levels or advanced mucosal thickening. No mastoid or middle ear effusion. Normal orbits. IMPRESSION: 1. Normal brain.  No intracranial metastatic disease. 2. Findings of disease spread within the soft tissues of the left neck are more completely described on the concomitant CT of the neck. Electronically Signed   By: Ulyses Jarred M.D.   On: 05/16/2022 01:04   CT Soft Tissue Neck W Contrast  Result Date: 05/15/2022 CLINICAL DATA:  Soft tissue swelling. History of head neck carcinoma. EXAM: CT NECK WITH CONTRAST TECHNIQUE: Multidetector CT imaging of the neck was performed using the standard protocol following the bolus administration of intravenous contrast. RADIATION DOSE REDUCTION: This exam was performed according to the departmental dose-optimization program which includes automated exposure control, adjustment of the mA and/or kV according to patient size and/or use of iterative reconstruction technique. CONTRAST:  6m OMNIPAQUE IOHEXOL 300 MG/ML  SOLN COMPARISON:  None Available. FINDINGS: PHARYNX AND LARYNX: Diffuse edema of the soft tissues of the oropharynx, hypopharynx and larynx, including the epiglottis. There is a small retropharyngeal effusion but no retropharyngeal or peritonsillar abscess. SALIVARY GLANDS: Increased salivary gland enhancement is likely a post treatment effect. THYROID: Normal. LYMPH NODES: Left level 2A nodal conglomerate with area of central necrosis that measures approximately 1.4 cm. There is an enhancing 5 mm right level 1A node. VASCULAR: The left internal jugular vein is thrombosed. There is abnormal contrast enhancement surrounding its course in the left neck, just distal to the  carotid bifurcation. The other major vessels are patent. There is a right chest wall Port-A-Cath with its tip below the field of view. LIMITED INTRACRANIAL: Normal. VISUALIZED ORBITS: Normal. MASTOIDS AND VISUALIZED PARANASAL SINUSES: No fluid levels or advanced mucosal thickening. No mastoid effusion. SKELETON: No bony spinal canal stenosis. No lytic or blastic lesions. UPPER CHEST: Clear. OTHER: There is a left retro auricular skin lesion measuring 2.5 x 1.3 cm. IMPRESSION: 1. Area of abnormal contrast enhancement at left level 2A, likely nodal conglomerate with areas of necrosis. There is likely extranodal extension with a cutaneous lesion of the retroauricular soft tissue. 2. Diffuse edema of the soft tissues of the oropharynx, hypopharynx and larynx, likely sequela of radiation therapy. 3. 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 Electronically Signed   By: KUlyses JarredM.D.   On: 05/15/2022 23:03   DG Chest 2 View  Result Date: 05/15/2022 CLINICAL DATA:  Weakness EXAM: CHEST - 2 VIEW COMPARISON:  Chest radiograph 08/31/2013 FINDINGS: A right chest wall port is in place with tip terminating in the right atrium. The cardiomediastinal silhouette is normal. There is no focal consolidation or pulmonary edema. There is no pleural effusion or pneumothorax There is no acute osseous abnormality. IMPRESSION: No radiographic evidence of acute cardiopulmonary process. Electronically Signed   By: PValetta MoleM.D.   On: 05/15/2022 18:19   CT Head Wo Contrast  Result Date: 05/15/2022 CLINICAL DATA:  Slurred speech facial droop multiple falls EXAM: CT HEAD WITHOUT CONTRAST CT CERVICAL SPINE WITHOUT CONTRAST TECHNIQUE: Multidetector CT imaging of the head and cervical spine was performed following the standard protocol without intravenous contrast. Multiplanar CT image reconstructions of the cervical spine were also generated. RADIATION DOSE REDUCTION: This exam was performed according to the departmental  dose-optimization program which includes automated exposure control, adjustment of the mA and/or kV according to patient size and/or use of iterative reconstruction technique. COMPARISON:  None Available. FINDINGS: CT HEAD FINDINGS Brain: No evidence of acute infarction, hemorrhage, cerebral edema, mass, mass effect, or midline shift. No hydrocephalus or extra-axial fluid collection. Vascular: No hyperdense vessel. Skull: Normal. Negative for fracture or focal lesion. Sinuses/Orbits: Mild mucosal thickening in the maxillary sinuses. The orbits are unremarkable. Other: The mastoid air cells are well aerated. CT CERVICAL SPINE FINDINGS Alignment: Reversal of the normal cervical lordosis. No traumatic listhesis. Skull base and vertebrae: No acute fracture or suspicious osseous lesion. Soft tissues and spinal canal: No prevertebral fluid or swelling. No visible canal hematoma. Disc levels: No high-grade spinal canal stenosis or neural foraminal narrowing. Upper chest: No focal pulmonary opacity or pleural effusion. Other: Evaluation of the neck soft tissues is limited by the absence of intravenous contrast. Within this limitation, abnormal soft tissue in the left parapharyngeal space, in the region of left level 2A lymph nodes, extending into the left paravertebral musculature, and an exophytic lesion posterior to the left ear are favored to be related to the patient's known throat cancer. IMPRESSION: 1.  No acute intracranial process. 2.  No acute fracture or traumatic listhesis in the cervical spine. 3. Evaluation of the neck soft tissues is limited by the absence of intravenous contrast. Within this limitation, there are changes in the left neck that are likely related to the patient's throat cancer. Electronically Signed   By: Merilyn Baba M.D.   On: 05/15/2022 18:13   CT Cervical Spine Wo Contrast  Result Date: 05/15/2022 CLINICAL DATA:  Slurred speech facial droop multiple falls EXAM: CT HEAD WITHOUT CONTRAST  CT CERVICAL SPINE WITHOUT CONTRAST TECHNIQUE: Multidetector CT imaging of the head and cervical spine was performed following the standard protocol without intravenous contrast. Multiplanar CT image reconstructions of the cervical spine were also generated. RADIATION DOSE REDUCTION: This exam was performed according to the departmental dose-optimization program which includes automated exposure control, adjustment of the mA and/or kV according to patient size and/or use of iterative reconstruction technique. COMPARISON:  None Available. FINDINGS: CT HEAD FINDINGS Brain: No evidence of acute infarction, hemorrhage, cerebral edema, mass, mass effect, or midline shift. No hydrocephalus or extra-axial fluid collection. Vascular: No hyperdense vessel. Skull: Normal. Negative for fracture or focal lesion. Sinuses/Orbits: Mild mucosal thickening in the maxillary sinuses. The orbits are unremarkable. Other: The mastoid air cells are well aerated. CT CERVICAL SPINE FINDINGS Alignment: Reversal of the normal cervical lordosis. No traumatic listhesis. Skull base and vertebrae: No acute fracture or suspicious osseous lesion. Soft tissues and spinal canal: No prevertebral fluid or swelling. No visible canal hematoma. Disc levels: No high-grade spinal canal stenosis or neural foraminal narrowing. Upper chest: No focal pulmonary opacity or pleural effusion. Other: Evaluation of the neck soft tissues is limited by the absence of intravenous contrast. Within this limitation, abnormal soft tissue in the left parapharyngeal space, in the region of left level 2A lymph nodes, extending into the left paravertebral musculature, and an exophytic lesion posterior to the left ear are favored to be related to the patient's known throat cancer. IMPRESSION: 1.  No acute intracranial process. 2.  No acute fracture or traumatic listhesis in the cervical spine. 3. Evaluation of the neck soft tissues is limited by the absence of intravenous  contrast. Within this limitation, there are changes in the left neck that are likely related to the patient's throat  cancer. Electronically Signed   By: Merilyn Baba M.D.   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  Her disease extended into fungating soft tissue as well as thrombosis of left IJ and is at significant risk of severe bleeding due to erosion into the vessels, septic thrombophlebitis and cavernous sinus invasion. Also complicated with sepsis secondary to possible aspiration pneumonia, necrotic cutaneous lesion Continue antibiotics and supportive care.  Blood transfusion, IV Zosyn antibiotics, speech swallow evaluation. With her aggressive disease and extensive local regional tumor burden, recommend patient to be transferred to [facility] when bed is available.Prognosis is poor.  Consulted palliative care service.  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  Thank you for allowing me to participate in the care of this patient.   Earlie Server, MD, PhD Hematology Oncology 06/06/2022

## 2022-06-06 NOTE — Progress Notes (Signed)
       CROSS COVER NOTE  NAME: Latashia R. Adine Heimann MRN: [id number] DOB : May 11, 1961    Date of Service   06/06/2022  HPI/Events of Note   Rapid response called for tachycardia (HR 150s), hypoxia (SPO2 80s), and somnolence.  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 She presented to [facility] ED with request to have her port flushed and was found to be altered on arrival. She is currently NPO pending modified barium swallow study later today to eval for aspiration.  On arrival to bedside she arouses to physical stimulation and follows commands, no focal deficits noted. Temp 36.8C BP 128/71 HR 137 RR 18 SPO2 95% on 4L Nasal cannula. Orientation is difficult to assess secondary to baseline slurred speech. Lung sounds are clear to auscultation and patient does not have increased work of breathing. She received IV benadryl ~45 mins prior to Rapid Response activation.  Interventions   Plan:  Hypoxia secondary to Aspiration Pneumonia CXR- (R) lower lobe opacity Continue Zosyn per ID Continue supplemental O2 Aspiration Precautions NPO  Tachycardia EKG-ST HR 123  Somnolence secondary to medication effect Benadryl added to "allergy" list       This document was prepared using Dragon voice recognition software and may include unintentional dictation errors.  Neomia Glass DNP, MHA, FNP-BC Nurse Practitioner [facility] Pager [phone]

## 2022-06-06 NOTE — Progress Notes (Signed)
       CROSS COVER NOTE  NAME: Christina Porter MRN: [id number] DOB : [birth year]    Date of Service   06/06/22  HPI/Events of Note   Medication request received for sleep aid while NPO  Interventions   Plan: Benadryl IV x1    This document was prepared using Dragon voice recognition software and may include unintentional dictation errors.  Neomia Glass DNP, MHA, FNP-BC Nurse Practitioner [facility] Pager [phone]

## 2022-06-06 NOTE — Significant Event (Signed)
Rapid Response Event Note   Reason for Call :  AMS, Patient hard to arouse  Initial Focused Assessment:  On arrival to patients room patient snoring Vitals stable at BP 128/71(87) Hr 137 RR 18 O2 95 on 4L.  Primary nurse reported  giving patient IV benadryl ~ 63mns earlier. Primary nurse also reported patient HR jumped up for 150 and sustain for a bit prior to calling the rapid. Sternum rubbed patient and patient became alert, able to follow commands and appeared  more at baseline. KNeomia GlassNP at bed side. Blood glucose at 153.  Patient easier to arouse throughout the rest of rapid and able to protect airway.     Interventions:  EKG gotten for tachy cardia Morning labs to be reviewed.  Plan of Care:     Event Summary:   MD Notified: KBethena MidgetCall [id number]  0600 Conversation with primary nurse OMeriel Picanurse reported no change in patients status MGonzella Lex RN

## 2022-06-06 NOTE — Progress Notes (Signed)
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  She presented in the ED in [place] where she presented with chest congestion, shortness of breath and hemoptysis.  CTA chest was negative for PE and admission was advised but patient left AMA.  She presented in the [facility] ED with c/o: disorientation, hypertension.  CT head left retroauricular soft tissue mass better assessed on CT soft tissue neck.  Patient was admitted for sepsis secondary to possible UTI and started on IV fluids and IV antibiotics. Infectious diseases consulted.  Since patient follows up with heme oncology at [facility] , attempt was made to transfer the patient to [facility] but no bed available.  ENT, pulmonology, oncology consulted.  All recommended transfer to [facility].  No bed available yet.  Palliative care consulted to discuss goals of care.  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  Lactic acid 0.8, UA : not significantly abnormal. Continue IV fluids as per sepsis protocol. Patient initiated on IV ceftriaxone for possible UTI. Infectious disease consulted, recommended changing antibiotics to IV Zosyn. Follow-up cultures.  ID recommended transfer to [facility] for further care.  Internal jugular vein thrombosis left: Patient was hospitalized from 7/17 to 7/21 with concern for septic thrombophlebitis. Cultures were negative.  Patient was treated with Unasyn and Augmentin to complete 2 weeks course. Follow-up repeat blood culture.  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   PET scan 1216/2012 was notable for local metastatic disease. Patient follows with Dr. Harriette Ohara at [facility].  S/p chemo ( Cisplatin) Recently contacted by radiation oncology Dr. Audelia Acton they are trying to reschedule steroid injection and simulation appointments,  she has missed prior appointments. Patient at high risk for erosion into vessels, Lemierre's syndrome, tumor extension into sigmoid sinus and cavernous sinus. Spoke with oncologist Dr. Harriette Ohara at [facility], he agreed with the transfer but there is no bed available at this point.  We will follow-up on transfer.  Hemoptysis: Patient has extensive history of head and neck cancer and is undergoing treatment at [facility]. Blood-tinged mucus is coming from her cancer.  ENT and pulmonology evaluation requested. Patient is not having any active bleeding, however she has extensive disease in the neck and she is at risk for acute arterial bleeding from tumor invasion and tumor necrosis from treatment. ENT recommended transfer to [facility] for further care.   Possible seizure disorder : Continue  gabapentin.  Cancer related pain/chronic pain: Continue oxycodone as needed  Hyponatremia: Resolved with IV hydration.  Tobacco  abuse: Continue nicotine patch.  UTI: Continue Zosyn.  Underweight: BMI 16.94 suspect severe protein calorie malnutrition Nutritionist evaluation   DVT prophylaxis: SCDs Code Status: Full code. Family Communication: No family at bed side. Disposition Plan:   Status is: Inpatient Remains inpatient appropriate because:  Admitted with sepsis possibly secondary to necrotic mass.  Infectious diseases consulted ,  Patient is getting IV antibiotics.  Attempt was made to transfer the patient to [facility] but there is no bed available.    Consultants:  Infectious diseases, pulmonology, ENT, palliative care  Procedures: CT head, CT soft neck Antimicrobials:  Anti-infectives (From admission, onward)    Start     Dose/Rate Route Frequency Ordered Stop   06/06/22 2200  fluconazole (DIFLUCAN) IVPB 200 mg        200 mg 100 mL/hr over 60 Minutes Intravenous Every 24 hours 06/05/22 1622     06/05/22 2200  piperacillin-tazobactam (ZOSYN) IVPB 3.375 g        3.375 g 12.5 mL/hr over 240 Minutes Intravenous Every 8 hours 06/05/22 1622     06/05/22 1800  fluconazole (DIFLUCAN) IVPB 400 mg        400 mg 100 mL/hr over 120 Minutes Intravenous  Once 06/05/22 1622 06/05/22 1949   06/05/22 1545  Ampicillin-Sulbactam (UNASYN) 3 g in sodium chloride 0.9 % 100 mL IVPB  Status:  Discontinued        3 g 200 mL/hr over 30 Minutes Intravenous Every 6 hours 06/05/22 1450 06/05/22 1622   06/04/22 0115  cefTRIAXone (ROCEPHIN) 2 g in sodium chloride 0.9 % 100 mL IVPB  Status:  Discontinued        2 g 200 mL/hr over 30 Minutes Intravenous Every 24 hours 06/04/22 0102 06/05/22 1449   06/04/22 0045  cefTRIAXone (ROCEPHIN) 1 g in sodium chloride 0.9 % 100 mL IVPB  Status:  Discontinued        1 g 200 mL/hr over 30 Minutes Intravenous  Once 06/04/22 0030 06/04/22 0109        Subjective: Patient was seen and examined at bedside.  Overnight events noted.   Patient appears frail, cachectic,  lying comfortably on 4  L of oxygen. Extensive neck mass covered with dressing on the left side,  Objective: Vitals:   06/06/22 1023 06/06/22 1103 06/06/22 1239 06/06/22 1315  BP: 111/63 100/75 115/71 126/83  Pulse: (!) 131 (!) 125 (!) 126 (!) 117  Resp: '18 18 18 20  '$ Temp: 98.6 F (37 C) 98.2 F (36.8 C) 97.8 F (36.6 C) 98.2 F (36.8 C)  TempSrc: Oral Axillary Oral Axillary  SpO2: 92% 100% 100% 100%  Weight:      Height:        Intake/Output Summary (Last 24 hours) at 06/06/2022 1622 Last data filed at 06/06/2022 1315 Gross per 24 hour  Intake 2198.69 ml  Output --  Net 2198.69 ml    Filed Weights   06/03/22 1728 06/04/22 0201 06/06/22 0450  Weight: 42 kg 80 kg 58.5 kg    Examination:  General exam: Appears frail, cachectic, confused, not in any acute distress.  Deconditioned Respiratory system: Decreased breath sounds, respiratory effort normal, RR 15 Cardiovascular system: S1 & S2 heard, regular rate rhythm, no murmur. Gastrointestinal system: Abdomen is soft, non tender, nondistended, BS +. Central nervous system: Alert and oriented x 1 . No focal neurological deficits. Extremities: No edema,  no cyanosis, no clubbing. Skin: No rashes, lesions or ulcers Psychiatry:  Mood & affect appropriate.     Data Reviewed: I have personally reviewed following labs and imaging studies  CBC: Recent Labs  Lab 06/04/22 0020 06/04/22 0852 06/05/22 0510 06/05/22 0831 06/06/22 0449  WBC 20.3* 19.7* 24.6*  --  18.8*  NEUTROABS 17.5*  --   --   --   --   HGB 9.8* 11.9* 7.9* 8.0* 7.7*  HCT 29.9* 35.8* 24.7* 24.7* 24.7*  MCV 96.5 96.0 98.4  --  100.4*  PLT 518* 471* 419*  --  329    Basic Metabolic Panel: Recent Labs  Lab 06/03/22 2125 06/04/22 0852 06/05/22 0510 06/05/22 1815 06/05/22 2154 06/06/22 0449  NA 133* 135 139  --   --  135  K 3.2* 2.9* 4.5  --   --  5.0  CL 95* 95* 100  --   --  100  CO2 '26 27 30  '$ --   --  29  GLUCOSE 111* 113* 112*  --   --  131*  BUN 6* <5* 5*  --   --  9   CREATININE 0.51 0.53 0.54  --   --  0.57  CALCIUM 8.6* 9.3 8.6*  --   --  8.5*  MG  --   --  1.2* 1.9 2.0 1.6*  PHOS  --   --  3.0 1.8* 1.7* 2.0*    GFR: Estimated Creatinine Clearance: 58.4 mL/min (by C-G formula based on SCr of 0.57 mg/dL). Liver Function Tests: Recent Labs  Lab 06/03/22 2125 06/06/22 0449  AST 18 15  ALT 9 9  ALKPHOS 106 92  BILITOT 0.5 0.3  PROT 6.1* 5.6*  ALBUMIN 2.5* 2.1*    No results for input(s): "LIPASE", "AMYLASE" in the last 168 hours. No results for input(s): "AMMONIA" in the last 168 hours. Coagulation Profile: Recent Labs  Lab 06/04/22 0020  INR 1.1    Cardiac Enzymes: No results for input(s): "CKTOTAL", "CKMB", "CKMBINDEX", "TROPONINI" in the last 168 hours. BNP (last 3 results) No results for input(s): "PROBNP" in the last 8760 hours. HbA1C: No results for input(s): "HGBA1C" in the last 72 hours. CBG: Recent Labs  Lab 06/05/22 2056 06/05/22 2353 06/06/22 0402 06/06/22 0831 06/06/22 1246  GLUCAP 148* 165* 153* 132* 176*   Lipid Profile: No results for input(s): "CHOL", "HDL", "LDLCALC", "TRIG", "CHOLHDL", "LDLDIRECT" in the last 72 hours. Thyroid Function Tests: No results for input(s): "TSH", "T4TOTAL", "FREET4", "T3FREE", "THYROIDAB" in the last 72 hours. Anemia Panel: No results for input(s): "VITAMINB12", "FOLATE", "FERRITIN", "TIBC", "IRON", "RETICCTPCT" in the last 72 hours. Sepsis Labs: Recent Labs  Lab 06/03/22 2124 06/03/22 2125 06/04/22 0020 06/04/22 0852  PROCALCITON  --  1.13  --  0.91  LATICACIDVEN 0.7  --  0.8  --      Recent Results (from the past 240 hour(s))  Blood Culture (routine x 2)     Status: None (Preliminary result)   Collection Time: 06/03/22  9:24 PM   Specimen: BLOOD  Result Value Ref Range Status   Specimen Description BLOOD BLOOD LEFT FOREARM  Final   Special Requests   Final    BOTTLES DRAWN AEROBIC AND ANAEROBIC Blood Culture adequate volume   Culture   Final    NO GROWTH 3  DAYS Performed at [facility] [address] [place], [place] [address]    Report Status PENDING  Incomplete  Blood Culture (routine x 2)  Status: None (Preliminary result)   Collection Time: 06/03/22  9:26 PM   Specimen: BLOOD  Result Value Ref Range Status   Specimen Description BLOOD BLOOD RIGHT FOREARM  Final   Special Requests   Final    BOTTLES DRAWN AEROBIC AND ANAEROBIC Blood Culture results may not be optimal due to an inadequate volume of blood received in culture bottles   Culture   Final    NO GROWTH 3 DAYS Performed at [facility], [address], [place], [place] [address]    Report Status PENDING  Incomplete  Urine Culture     Status: None   Collection Time: 06/04/22 12:20 AM   Specimen: Urine, Random  Result Value Ref Range Status   Specimen Description   Final    URINE, RANDOM Performed at [facility], [address], [place], [place] [address]    Special Requests   Final    NONE Performed at [facility], [address], [place], [place] [address]    Culture   Final    NO GROWTH Performed at [facility], [place] [address] [place] [employer], [place] [address]    Report Status 06/05/2022 FINAL  Final  SARS Coronavirus 2 by RT PCR (hospital order, performed in [facility] hospital lab) *cepheid single result test*     Status: None   Collection Time: 06/04/22 12:20 AM   Specimen: Nasal Swab  Result Value Ref Range Status   SARS Coronavirus 2 by RT PCR NEGATIVE NEGATIVE Final    Comment: (NOTE) SARS-CoV-2 target nucleic acids are NOT DETECTED.  The SARS-CoV-2 RNA is generally detectable in upper and lower respiratory specimens during the acute phase of infection. The lowest concentration of SARS-CoV-2 viral copies this assay can detect is 250 copies / mL. A negative result does not preclude SARS-CoV-2 infection and should not be used as the sole basis for treatment or other patient management decisions.  A negative  result may occur with improper specimen collection / handling, submission of specimen other than nasopharyngeal swab, presence of viral mutation(s) within the areas targeted by this assay, and inadequate number of viral copies (<250 copies / mL). A negative result must be combined with clinical observations, patient history, and epidemiological information.  Fact Sheet for Patients:   https://www.patel.info/  Fact Sheet for Healthcare Providers: https://hall.com/  This test is not yet approved or  cleared by the [place] FDA and has been authorized for detection and/or diagnosis of SARS-CoV-2 by FDA under an Emergency Use Authorization (EUA).  This EUA will remain in effect (meaning this test can be used) for the duration of the COVID-19 declaration under Section 564(b)(1) of the Act, 21 U.S.C. section 360bbb-3(b)(1), unless the authorization is terminated or revoked sooner.  Performed at [facility], [address], [place], [place] [address]     Radiology Studies: [facility] Chest [place] 1 View  Result Date: 06/06/2022 CLINICAL DATA:  61 year old female with hypoxia.  History of cancer. EXAM: PORTABLE CHEST 1 VIEW COMPARISON:  Portable chest 06/03/2022 and earlier. FINDINGS: Portable AP upright view at 0424 hours. Stable right chest power port. Mildly lower lung volumes. Streaky and veiling new right lung base opacity. No pneumothorax. No pulmonary edema suspected. Left lung appears stable and negative. Percutaneous gastrostomy tube visible with negative upper abdominal bowel gas pattern. No acute osseous abnormality identified. IMPRESSION: New right lung base opacity which appears to be a combination of airspace disease and small new pleural effusion. Consider aspiration and/or  pneumonia. Electronically Signed   By: Genevie Ann M.D.   On: 06/06/2022 04:36     Scheduled Meds:  feeding supplement (OSMOLITE 1.5 CAL)  237 mL Per Tube QID    feeding supplement (PROSource TF)  45 mL Per Tube BID   gabapentin  600 mg Per Tube TID   heparin flush  10 Units Intracatheter Once   morphine  10 mg Per Tube Q6H   multivitamin  15 mL Per Tube Daily   QUEtiapine  100 mg Per Tube QHS   traZODone  100 mg Per Tube QHS   Continuous Infusions:  fluconazole (DIFLUCAN) IV     lactated ringers 100 mL/hr at 06/06/22 0150   piperacillin-tazobactam (ZOSYN)  IV 3.375 g (06/06/22 1521)   sodium phosphate 30 mmol in dextrose 5 % 250 mL infusion 30 mmol (06/06/22 1115)     LOS: 2 days    Time spent: 50 mins. Spent more than 50 minutes in coordination of care.  Spoke with transfer line, discussed the case in detail.  There is no bed, available awaiting transfer.    Shawna Clamp, MD [facility]   If 7PM-7AM, please contact night-coverage

## 2022-06-06 NOTE — Progress Notes (Signed)
ID Pt more alert today No as twitching  BP (!) 98/58 (BP Location: Right Arm)   Pulse (!) 117   Temp 99.1 F (37.3 C) (Oral)   Resp 18   Ht '5\' 2"'$  (1.575 m)   Wt 58.5 kg   SpO2 99%   BMI 23.58 kg/m   Chest b/l air entry Rhonchi Hss1s2 Tachycardia Left neck fungating mass  Labs    Latest Ref Rng & Units 06/06/2022    5:15 PM 06/06/2022    4:49 AM 06/05/2022    8:31 AM  CBC  WBC 4.0 - 10.5 K/uL  18.8    Hemoglobin 12.0 - 15.0 g/dL 9.2  7.7  8.0   Hematocrit 36.0 - 46.0 % 27.9  24.7  24.7   Platelets 150 - 400 K/uL  367         Latest Ref Rng & Units 06/06/2022    4:49 AM 06/05/2022    5:10 AM 06/04/2022    8:52 AM  CMP  Glucose 70 - 99 mg/dL 131  112  113   BUN 8 - 23 mg/dL 9  5  <5   Creatinine 0.44 - 1.00 mg/dL 0.57  0.54  0.53   Sodium 135 - 145 mmol/L 135  139  135   Potassium 3.5 - 5.1 mmol/L 5.0  4.5  2.9   Chloride 98 - 111 mmol/L 100  100  95   CO2 22 - 32 mmol/L '29  30  27   '$ Calcium 8.9 - 10.3 mg/dL 8.5  8.6  9.3   Total Protein 6.5 - 8.1 g/dL 5.6     Total Bilirubin 0.3 - 1.2 mg/dL 0.3     Alkaline Phos 38 - 126 U/L 92     AST 15 - 41 U/L 15     ALT 0 - 44 U/L 9       Impression/recommendation 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  Also extension into soft tissue of the neck with obstruction of left IJ and also compression of the ICA. High risk for erosion into vessels, lemierre's syndrome, tumor extension into sigmoid sinus and cavernous sinus. Edema of the face and oral cavity secondary to the left IJ obstruction And has aspiration pneumonitis. Patient leukocytosis secondary to the fungating mass as well as aspiration. Changed ceftriaxone to IV Zosyn.    Patient needs to be transferred to [facility] for further care   Acute hypoxic respiratory failure secondary to the above   COPD  Current smoker  Myoclonic jerks with muscular twitching clear due to morphine. Check ABG  to rule out CO2 retention  Anemia ? Pt awaiting transfer to [facility] [place]

## 2022-06-07 DIAGNOSIS — Z7189 Other specified counseling: Secondary | ICD-10-CM

## 2022-06-07 DIAGNOSIS — C760 Malignant neoplasm of head, face and neck: Secondary | ICD-10-CM | POA: Diagnosis not present

## 2022-06-07 DIAGNOSIS — L8990 Pressure ulcer of unspecified site, unspecified stage: Secondary | ICD-10-CM | POA: Insufficient documentation

## 2022-06-07 DIAGNOSIS — J690 Pneumonitis due to inhalation of food and vomit: Secondary | ICD-10-CM | POA: Diagnosis not present

## 2022-06-07 DIAGNOSIS — R6510 Systemic inflammatory response syndrome (SIRS) of non-infectious origin without acute organ dysfunction: Secondary | ICD-10-CM | POA: Diagnosis not present

## 2022-06-07 DIAGNOSIS — I82C12 Acute embolism and thrombosis of left internal jugular vein: Secondary | ICD-10-CM | POA: Diagnosis not present

## 2022-06-07 LAB — TYPE AND SCREEN
ABO/RH(D): B POS
Antibody Screen: NEGATIVE
Unit division: 0

## 2022-06-07 LAB — GLUCOSE, CAPILLARY
Glucose-Capillary: 121 mg/dL — ABNORMAL HIGH (ref 70–99)
Glucose-Capillary: 137 mg/dL — ABNORMAL HIGH (ref 70–99)
Glucose-Capillary: 138 mg/dL — ABNORMAL HIGH (ref 70–99)
Glucose-Capillary: 149 mg/dL — ABNORMAL HIGH (ref 70–99)
Glucose-Capillary: 150 mg/dL — ABNORMAL HIGH (ref 70–99)
Glucose-Capillary: 157 mg/dL — ABNORMAL HIGH (ref 70–99)

## 2022-06-07 LAB — BPAM RBC
Blood Product Expiration Date: 202308262359
ISSUE DATE / TIME: 202308081040
Unit Type and Rh: 7300

## 2022-06-07 MED ORDER — SODIUM CHLORIDE 0.9 % IV SOLN
3.0000 g | Freq: Four times a day (QID) | INTRAVENOUS | Status: DC
  Administered 2022-06-08 – 2022-06-20 (×50): 3 g via INTRAVENOUS
  Filled 2022-06-07 (×2): qty 3
  Filled 2022-06-07: qty 8
  Filled 2022-06-07: qty 3
  Filled 2022-06-07 (×2): qty 8
  Filled 2022-06-07: qty 3
  Filled 2022-06-07 (×2): qty 8
  Filled 2022-06-07: qty 3
  Filled 2022-06-07 (×2): qty 8
  Filled 2022-06-07 (×3): qty 3
  Filled 2022-06-07: qty 8
  Filled 2022-06-07: qty 3
  Filled 2022-06-07: qty 8
  Filled 2022-06-07 (×2): qty 3
  Filled 2022-06-07 (×3): qty 8
  Filled 2022-06-07 (×2): qty 3
  Filled 2022-06-07: qty 8
  Filled 2022-06-07 (×2): qty 3
  Filled 2022-06-07 (×2): qty 8
  Filled 2022-06-07 (×2): qty 3
  Filled 2022-06-07: qty 8
  Filled 2022-06-07 (×3): qty 3
  Filled 2022-06-07: qty 8
  Filled 2022-06-07: qty 3
  Filled 2022-06-07: qty 8
  Filled 2022-06-07: qty 3
  Filled 2022-06-07: qty 8
  Filled 2022-06-07: qty 3
  Filled 2022-06-07 (×3): qty 8
  Filled 2022-06-07: qty 3
  Filled 2022-06-07 (×3): qty 8
  Filled 2022-06-07 (×4): qty 3
  Filled 2022-06-07: qty 8

## 2022-06-07 MED ORDER — SALINE SPRAY 0.65 % NA SOLN
1.0000 | NASAL | Status: DC | PRN
Start: 2022-06-07 — End: 2022-06-21
  Administered 2022-06-07: 1 via NASAL
  Filled 2022-06-07: qty 44

## 2022-06-07 NOTE — Progress Notes (Signed)
   06/07/22 0743  Assess: MEWS Score  Temp 99.2 F (37.3 C)  BP 129/64  MAP (mmHg) 84  Pulse Rate (!) 111  Resp 16  Level of Consciousness Alert  SpO2 99 %  O2 Device Nasal Cannula  Patient Activity (if Appropriate) In bed  O2 Flow Rate (L/min) 4 L/min  Assess: MEWS Score  MEWS Temp 0  MEWS Systolic 0  MEWS Pulse 2  MEWS RR 0  MEWS LOC 0  MEWS Score 2  MEWS Score Color Yellow  Assess: if the MEWS score is Yellow or Red  Were vital signs taken at a resting state? Yes  Focused Assessment No change from prior assessment  Does the patient meet 2 or more of the SIRS criteria? No  Does the patient have a confirmed or suspected source of infection? Yes  Provider and Rapid Response Notified? No  MEWS guidelines implemented *See Row Information* Yes  Treat  MEWS Interventions Administered scheduled meds/treatments  Pain Scale 0-10  Pain Score Asleep  Take Vital Signs  Increase Vital Sign Frequency  Yellow: Q 2hr X 2 then Q 4hr X 2, if remains yellow, continue Q 4hrs  Escalate  MEWS: Escalate Yellow: discuss with charge nurse/RN and consider discussing with provider and RRT  Notify: Charge Nurse/RN  Name of Charge Nurse/RN Notified Misty, RN  Date Charge Nurse/RN Notified 06/07/22  Time Charge Nurse/RN Notified 1023  Notify: Provider  Provider Name/Title Emeterio Reeve, DO  Date Provider Notified 06/07/22  Time Provider Notified 1026  Method of Notification Page  Notification Reason Other (Comment) (yellow mews)  Provider response No new orders  Date of Provider Response 06/07/22  Time of Provider Response 1045  Document  Patient Outcome Stabilized after interventions  Progress note created (see row info) Yes  Assess: SIRS CRITERIA  SIRS Temperature  0  SIRS Pulse 1  SIRS Respirations  0  SIRS WBC 1  SIRS Score Sum  2

## 2022-06-07 NOTE — Consult Note (Signed)
Christina Porter, Christina Porter [id number] 04/16/1961 Christina Reeve, DO  Reason for Consult: Follow-up head neck cancer, hemoptysis  HPI: Since seeing her Monday afternoon the blood-tinged mucus has subsided, she does seem a little bit more awake and arousable this afternoon.  Allergies:  Allergies  Allergen Reactions   Benadryl [Diphenhydramine] Other (See Comments)    Somnolence when given 25 mg IV; consider low dose administration    ROS: Review of systems normal other than 12 systems except per HPI.  PMH:  Past Medical History:  Diagnosis Date   Bipolar 1 disorder ([place])    Seizures ([place])     FH:  Family History  Family history unknown: Yes    SH:  Social History   Socioeconomic History   Marital status: Married    Spouse name: Not on file   Number of children: Not on file   Years of education: Not on file   Highest education level: Not on file  Occupational History   Not on file  Tobacco Use   Smoking status: Every Day    Types: Cigarettes   Smokeless tobacco: Never  Vaping Use   Vaping Use: Never used  Substance and Sexual Activity   Alcohol use: Yes   Drug use: Never   Sexual activity: Not on file  Other Topics Concern   Not on file  Social History Narrative   ** Merged History Encounter **       Social Determinants of Health   Financial Resource Strain: Not on file  Food Insecurity: Not on file  Transportation Needs: Not on file  Physical Activity: Not on file  Stress: Not on file  Social Connections: Not on file  Intimate Partner Violence: Not on file    PSH:  Past Surgical History:  Procedure Laterality Date   CESAREAN SECTION      Physical  Exam: Physical exam essentially unchanged, she continues to have significant trismus making examination of the oropharynx difficult.  Her breathing seems to be a little better today.  Examination of the neck shows this fungating mass emanating from the left neck with drainage essentially  unchanged.   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  I had about a 30-minute discussion with [name] at bedside in the room sitter present.  I have told her that this appears to be a very extensive head neck cancer, that does not appear to be responding to the initial treatment with experimental therapy that she has received at [facility].  We have been trying diligently since Monday to secure her bed at [facility], however there are no beds currently available.  From an ENT surgical standpoint, oncologic standpoint there is no further treatment that we can offer her at [facility] that in my opinion would cure this cancer.  I also had a long discussion with her about the risk of significant bleeding should this tumor erode into a large vessel including the carotid artery.  The current bleeding has stopped but with this extensive cancer and the fact that it is already occluded the internal jugular vein she is at high risk.  I also spent about 30 minutes speaking with her son Christina Porter and explaining to him our opinions about her care.  She met with palliative care this afternoon and decided that she wanted to be a full code.  This would include intubation possible tracheostomy embolization of major bleeding.  I think an  attempt to try to stop bleeding orally would be very difficult due to the significant trismus which she has.  I also discussed with both of them that if this tumor enlarges that she may need an awake tracheostomy tube placement.  In talking with both of Christina Porter and [name] I do not think that they fully comprehend the extent of her disease in my opinion.  I have encouraged Christina Porter to reach out with her oncologist and her head neck surgeon in [facility] that has cared for her so far.  I truly believe that palliative care and/or hospice at this point would likely be the best treatment option for her.  Christina Porter says he is coming  over to the hospital tonight to sit down and discuss this with her.  At this point they would like to proceed with any care which she might need including a full code.  I did tell Christina Porter that she needs to be strict n.p.o. and only use the G-tube for feeding she is at significant risk for aspiration.  We will continue to try to facilitate transfer to [facility], but I have told Christina Porter that we are not allowed to load her up on an ambulance and just take her to [facility].  He understands this and will make some decision with his mom tonight about how they would like for [place] to proceed with care.  If she remains here at [facility], and her lungs improve, and they wish to proceed, then we may proceed with an awake tracheostomy possibly next week.  I will continue to follow along with her care and my partners are aware of the issues surrounding her care.  I have also told Christina Porter that I would be happy to speak with the oncologist and/or the ENT surgeons at [facility] in further detail if they would like to discuss her case with me.   Roena Malady 06/07/2022 5:10 PM

## 2022-06-07 NOTE — Progress Notes (Signed)
ID Pt more alert today But has trouble speaking- hoarse voice Some difficulty swallowing  BP 106/74 (BP Location: Left Arm)   Pulse (!) 107   Temp 98.7 F (37.1 C) (Oral)   Resp 16   Ht '5\' 2"'$  (1.575 m)   Wt 58.5 kg   SpO2 100%   BMI 23.58 kg/m   Chest b/l air entry Rhonchi B/l Hss1s2 Tachycardia Left neck fungating mass  Labs    Latest Ref Rng & Units 06/06/2022    5:15 PM 06/06/2022    4:49 AM 06/05/2022    8:31 AM  CBC  WBC 4.0 - 10.5 K/uL  18.8    Hemoglobin 12.0 - 15.0 g/dL 9.2  7.7  8.0   Hematocrit 36.0 - 46.0 % 27.9  24.7  24.7   Platelets 150 - 400 K/uL  367         Latest Ref Rng & Units 06/06/2022    4:49 AM 06/05/2022    5:10 AM 06/04/2022    8:52 AM  CMP  Glucose 70 - 99 mg/dL 131  112  113   BUN 8 - 23 mg/dL 9  5  <5   Creatinine 0.44 - 1.00 mg/dL 0.57  0.54  0.53   Sodium 135 - 145 mmol/L 135  139  135   Potassium 3.5 - 5.1 mmol/L 5.0  4.5  2.9   Chloride 98 - 111 mmol/L 100  100  95   CO2 22 - 32 mmol/L '29  30  27   '$ Calcium 8.9 - 10.3 mg/dL 8.5  8.6  9.3   Total Protein 6.5 - 8.1 g/dL 5.6     Total Bilirubin 0.3 - 1.2 mg/dL 0.3     Alkaline Phos 38 - 126 U/L 92     AST 15 - 41 U/L 15     ALT 0 - 44 U/L 9       Impression/recommendation 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  Also extension into soft tissue of the neck with obstruction of left IJ and also compression of the ICA. High risk for erosion into vessels, lemierre's syndrome, tumor extension into sigmoid sinus and cavernous sinus. Edema of the face and oral cavity secondary to the left IJ obstruction And has aspiration pneumonitis. Patient leukocytosis secondary to the fungating mass as well as aspiration. Currently on zosyn, as culture neg, de-escalate to uasyn to cover oral flora   Patient needs to be transferred to [facility] for further care   Acute hypoxic respiratory failure secondary to the above    COPD  Current smoker  Myoclonic jerks with muscular twitching  due to morphine. Much improved  Anemia ? Pt awaiting transfer to [facility]

## 2022-06-07 NOTE — Hospital Course (Addendum)
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 Augmentin with plan for follow-up at [facility], with radiation starting on 05/30/2022.  She was seen in [facility] ED on 06/02/2022 for hypoxia and shortness of breath due to concern for aspiration pneumonia, unfortunately she left AMA.  She later presented to [facility] ED on the same day, but also left AMA. Presented again to [facility] ED 06/03/2022 w/ tachycardia/tachypnea.   Patient was admitted for sepsis secondary to possible UTI vs neck mass, started on IV fluids and IV antibiotics. 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   8/6: Admitted by Hospitalist 8/7: ENT & ID consulted. 8/8: Oncology consulted. Concern for aspiration 8/9: Voice becoming hoarse. Palliative Care consulted 8/10: Some SOB noted. Vascular Surgery consulted. 8/11: Rapid response called due to recurrent bleeding with development of acute respiratory distress.  ENT performed emergent Tracheostomy, Vascular Surgery performed coil embolization of first 3 external carotid artery branches and placed Left Carotid Stent.  PCCM consulted for vent management 8/12 attempted SBT, failed initial attempt secondary to tachypnea and hypoxia.  Later able to tolerate TC  8/13: Received 1 unit pRBC's overnight. Tolerating TC trials 8/15: transfer back to hospitalist service. D/w [name] see below. Spoke w/ son, see separate progress note, plan for meeting tomorrow. See progress note from 8/15 re d/w oncology team and ENT at  [facility].  8/16: ambulating, urinating, tolerating trach w/ O2 support. Son has not shown up as of 3 pm. Pt appears to be progressing, will consult TOC for DME/HH if we can arrange this and no further bleeding may be appropriate to go home if patient ans her son are amenable  8/17-8/19: Family education for DME and trach care provided.  Waiting on all DME to be set up before discharge  [place]: Per palliative care note 08/14 and c/w multiple previous discussions w/ palliative care: oldest son is her preferred decision-maker. "She indicates  that she will undergo whatever is needed to try to add every single extra day she can get on this earth... Full code and full scope. Patient would like any and all care indicated for as long as possible regardless of pain or suffering, without limit."  Since patient follows up with hem-onc at [facility] (family is hopeful for treatment there to be helpful), her oncologist Dr Harriette Ohara at [facility] 06/06/2022 reported they would accept patient if/when bed available. Multiple attempts have been made to transfer the patient to [facility] but no bed available.    8/20: Patient is stable for discharge with trach collar and PEG tube.  Prognosis remains very guarded and poor. Daughter has to learn trach care which will be done tomorrow. Need to have a close follow-up with her [facility] providers. Also waiting for equipment to be delivered before discharge. 8/21: Overnight patient was coughing up some blood, repeat hemoglobin at 7.7 from [facility] in the morning.  She was giving transischemic acid nebulizer with no more hemoptysis. Daughter to complete trach training today before discharge. Magnesium at 1.5 which is being repleted. 1: 00 PM: Rapid response was called  due to persistent bleeding and multiple clots were removed with suctioning of her trach.  Patient keeps saying that she cannot breathe although I tells stable with sinus tachycardia.  Talked with son on phone and he wants her to go on ventilator  if needed.  Talked with her radiation oncologist Dr. Crisoforo Oxford at [facility] and according to her their assessment is based on few weeks prior when she was not that sick.  Patient apparently missed multiple appointments for different reasons.  She was enrolled on a trial but has not started yet.  Patient is being transferred to ICU as she might require ventilator support.  Palliative care again met with patient and family.  She would like to remain full code but agrees to go home with hospice. Most likely can go home with hospice tomorrow.  They would like to continue with tube feed.  Patient is extremely high risk for deterioration and death.  Still wishes to remain full code with full scope of care.

## 2022-06-07 NOTE — Progress Notes (Signed)
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  She presented in the ED in [place] where she presented with chest congestion, shortness of breath and hemoptysis.  CTA chest was negative for PE and admission was advised but patient left AMA. She presented in the [facility] ED 05/16/2022 with c/o: disorientation, hypertension.   CT head 07/18 left retroauricular soft tissue mass better assessed on CT soft tissue neck. Patient was admitted for sepsis secondary to possible UTI vs neck mass, started on IV fluids and IV antibiotics. Infectious diseases consulted. Continuing IV antibiotics with Zosyn. Palliative care consulted to discuss goals of care.  Since patient follows up with hem-onco at [facility], multiple attempts have been made to transfer the patient to [facility] but no bed available.  ENT, pulmonology, oncology consulted. All recommended transfer to [facility] - hospitalist spoke to oncology team Dr Harriette Ohara at [facility] 06/06/2022 and reported they would accept patient when bed available.   Called [name] again today 06/07/2022, they are at capacity and cannot accept transfer for medicine patients.   Consultants:  ID ENT Oncology Pulmonology Palliative  Procedures: none    Subjective: Patient verbal communication limited d/t neck/throat mass but she is able to report pain and "feels like crap." Denies SOB.      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  Lactic acid 0.8, UA : not significantly abnormal. Continue IV fluids as per sepsis protocol. Patient initiated on IV ceftriaxone for possible UTI --> changed to Zosyn,  Infectious disease consulted, recommended transfer to [facility] for further care.   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 Harriette Ohara at [facility]  S/p chemo ( Cisplatin). Recently contacted by radiation oncology Dr. Audelia Acton they are trying to reschedule steroid injection and simulation appointments,  she has missed prior appointments. Patient at high risk for erosion into vessels, Lemierre's syndrome, tumor extension into sigmoid sinus and cavernous sinus. Blood-tinged mucus is coming from her cancer.  Previous hospitalist spoke 06/06/2022 with oncologist Dr. Harriette Ohara at [facility], he agreed with the transfer but there is no bed available at this point, still no beds as of 06/07/2022 ENT and pulmonology and oncology here have evaluated the patient and have recommended transfer  to [facility] for further care. Patient is not having any active bleeding, however she has extensive disease in the neck and she is at risk for acute arterial bleeding from tumor invasion and tumor necrosis from  treatment. Palliative consult pending   Possible seizure disorder : Continue gabapentin.   Cancer related pain/chronic pain: Continue oxycodone as needed   Hyponatremia: Resolved with IV hydration.   Tobacco abuse: Continue nicotine patch.   UTI - RULED OUT UA prn symptoms    Underweight: BMI 16.94  severe protein calorie malnutrition Nutritionist evaluation    DVT prophylaxis: SCD Code Status: FULL Family Communication: pending palliative consult Disposition Plan / TOC needs: remains inpatient, unable to transfer to [facility] at this time  Barriers to discharge / significant pending items: bed availability at [facility]              Objective: Vitals:   06/07/22 0552 06/07/22 0743 06/07/22 1012 06/07/22 1219  BP: 115/66 129/64 106/74 114/74  Pulse: (!) 108 (!) 111 (!) 107 (!) 105  Resp: '20 16 16 18  '$ Temp: 99.8 F (37.7 C) 99.2 F (37.3 C) 98.7 F (37.1 C) (!) 97.4 F (36.3 C)  TempSrc: Oral Oral Oral Axillary  SpO2: 100% 99% 100% 100%  Weight:      Height:        Intake/Output Summary (Last 24 hours) at 06/07/2022 1433 Last data filed at 06/07/2022 3295 Gross per 24 hour  Intake 1829.25 ml  Output 300 ml  Net 1529.25 ml   Filed Weights   06/03/22 1728 06/04/22 0201 06/06/22 0450  Weight: 42 kg 80 kg 58.5 kg    Examination:  Constitutional:  VS as above General Appearance: alert, frail, NAD Ears, Nose, Mouth, Throat, neck: Bandage on L neck Swelling in L face, tongue, neck  Respiratory: Fair respiratory effort No wheeze No rhonchi No rales Cardiovascular: S1/S2 normal No lower extremity edema Gastrointestinal: No tenderness No masses Musculoskeletal:  No clubbing/cyanosis of digits Symmetrical movement in all extremities Neurological: Alert Psychiatric: Normal judgment/insight Flat mood and affect       Scheduled Medications:   feeding supplement (OSMOLITE 1.5 CAL)  237 mL Per Tube QID   feeding supplement (PROSource TF)  45 mL  Per Tube BID   gabapentin  600 mg Per Tube TID   heparin flush  10 Units Intracatheter Once   morphine  10 mg Per Tube Q6H   multivitamin  15 mL Per Tube Daily   QUEtiapine  100 mg Per Tube QHS   traZODone  100 mg Per Tube QHS    Continuous Infusions:  fluconazole (DIFLUCAN) IV Stopped (06/06/22 2254)   lactated ringers Stopped (06/06/22 1110)   piperacillin-tazobactam (ZOSYN)  IV 3.375 g (06/07/22 1259)    PRN Medications:  acetaminophen (TYLENOL) oral liquid 160 mg/5 mL, acetaminophen **OR** acetaminophen, ondansetron **OR** ondansetron (ZOFRAN) IV, oxyCODONE, sodium chloride  Antimicrobials:  Anti-infectives (From admission, onward)    Start     Dose/Rate Route Frequency Ordered Stop   06/06/22 2200  fluconazole (DIFLUCAN) IVPB 200 mg        200 mg 100 mL/hr over 60 Minutes Intravenous Every 24 hours 06/05/22 1622     06/05/22 2200  piperacillin-tazobactam (ZOSYN) IVPB 3.375 g        3.375 g 12.5 mL/hr over 240 Minutes Intravenous Every 8 hours 06/05/22 1622     06/05/22 1800  fluconazole (DIFLUCAN) IVPB 400 mg        400 mg 100 mL/hr over 120  Minutes Intravenous  Once 06/05/22 1622 06/07/22 0207   06/05/22 1545  Ampicillin-Sulbactam (UNASYN) 3 g in sodium chloride 0.9 % 100 mL IVPB  Status:  Discontinued        3 g 200 mL/hr over 30 Minutes Intravenous Every 6 hours 06/05/22 1450 06/05/22 1622   06/04/22 0115  cefTRIAXone (ROCEPHIN) 2 g in sodium chloride 0.9 % 100 mL IVPB  Status:  Discontinued        2 g 200 mL/hr over 30 Minutes Intravenous Every 24 hours 06/04/22 0102 06/05/22 1449   06/04/22 0045  cefTRIAXone (ROCEPHIN) 1 g in sodium chloride 0.9 % 100 mL IVPB  Status:  Discontinued        1 g 200 mL/hr over 30 Minutes Intravenous  Once 06/04/22 0030 06/04/22 0109       Data Reviewed: I have personally reviewed following labs and imaging studies  CBC: Recent Labs  Lab 06/04/22 0020 06/04/22 0852 06/05/22 0510 06/05/22 0831 06/06/22 0449 06/06/22 1715   WBC 20.3* 19.7* 24.6*  --  18.8*  --   NEUTROABS 17.5*  --   --   --   --   --   HGB 9.8* 11.9* 7.9* 8.0* 7.7* 9.2*  HCT 29.9* 35.8* 24.7* 24.7* 24.7* 27.9*  MCV 96.5 96.0 98.4  --  100.4*  --   PLT 518* 471* 419*  --  367  --    Basic Metabolic Panel: Recent Labs  Lab 06/03/22 2125 06/04/22 0852 06/05/22 0510 06/05/22 1815 06/05/22 2154 06/06/22 0449 06/06/22 1715  NA 133* 135 139  --   --  135  --   K 3.2* 2.9* 4.5  --   --  5.0  --   CL 95* 95* 100  --   --  100  --   CO2 '26 27 30  '$ --   --  29  --   GLUCOSE 111* 113* 112*  --   --  131*  --   BUN 6* <5* 5*  --   --  9  --   CREATININE 0.51 0.53 0.54  --   --  0.57  --   CALCIUM 8.6* 9.3 8.6*  --   --  8.5*  --   MG  --   --  1.2* 1.9 2.0 1.6* 1.6*  PHOS  --   --  3.0 1.8* 1.7* 2.0* 5.3*   GFR: Estimated Creatinine Clearance: 58.4 mL/min (by C-G formula based on SCr of 0.57 mg/dL). Liver Function Tests: Recent Labs  Lab 06/03/22 2125 06/06/22 0449  AST 18 15  ALT 9 9  ALKPHOS 106 92  BILITOT 0.5 0.3  PROT 6.1* 5.6*  ALBUMIN 2.5* 2.1*   No results for input(s): "LIPASE", "AMYLASE" in the last 168 hours. No results for input(s): "AMMONIA" in the last 168 hours. Coagulation Profile: Recent Labs  Lab 06/04/22 0020  INR 1.1   Cardiac Enzymes: No results for input(s): "CKTOTAL", "CKMB", "CKMBINDEX", "TROPONINI" in the last 168 hours. BNP (last 3 results) No results for input(s): "PROBNP" in the last 8760 hours. HbA1C: No results for input(s): "HGBA1C" in the last 72 hours. CBG: Recent Labs  Lab 06/06/22 2134 06/06/22 2346 06/07/22 0557 06/07/22 0748 06/07/22 1159  GLUCAP 119* 130* 121* 137* 157*   Lipid Profile: No results for input(s): "CHOL", "HDL", "LDLCALC", "TRIG", "CHOLHDL", "LDLDIRECT" in the last 72 hours. Thyroid Function Tests: No results for input(s): "TSH", "T4TOTAL", "FREET4", "T3FREE", "THYROIDAB" in the last 72 hours. Anemia Panel: No  results for input(s): "VITAMINB12", "FOLATE",  "FERRITIN", "TIBC", "IRON", "RETICCTPCT" in the last 72 hours. Urine analysis:    Component Value Date/Time   COLORURINE YELLOW (A) 06/04/2022 0020   APPEARANCEUR CLEAR (A) 06/04/2022 0020   APPEARANCEUR Clear 08/31/2013 0312   LABSPEC 1.023 06/04/2022 0020   LABSPEC 1.003 08/31/2013 0312   PHURINE 8.0 06/04/2022 0020   GLUCOSEU NEGATIVE 06/04/2022 0020   GLUCOSEU Negative 08/31/2013 0312   HGBUR NEGATIVE 06/04/2022 0020   BILIRUBINUR NEGATIVE 06/04/2022 0020   BILIRUBINUR Negative 08/31/2013 0312   KETONESUR NEGATIVE 06/04/2022 0020   PROTEINUR NEGATIVE 06/04/2022 0020   NITRITE NEGATIVE 06/04/2022 0020   LEUKOCYTESUR SMALL (A) 06/04/2022 0020   LEUKOCYTESUR Negative 08/31/2013 0312   Sepsis Labs: '@LABRCNTIP'$ (procalcitonin:4,lacticidven:4)  Recent Results (from the past 240 hour(s))  Blood Culture (routine x 2)     Status: None (Preliminary result)   Collection Time: 06/03/22  9:24 PM   Specimen: BLOOD  Result Value Ref Range Status   Specimen Description BLOOD BLOOD LEFT FOREARM  Final   Special Requests   Final    BOTTLES DRAWN AEROBIC AND ANAEROBIC Blood Culture adequate volume   Culture   Final    NO GROWTH 4 DAYS Performed at [facility], [address], [place], [place] [address]    Report Status PENDING  Incomplete  Blood Culture (routine x 2)     Status: None (Preliminary result)   Collection Time: 06/03/22  9:26 PM   Specimen: BLOOD  Result Value Ref Range Status   Specimen Description BLOOD BLOOD RIGHT FOREARM  Final   Special Requests   Final    BOTTLES DRAWN AEROBIC AND ANAEROBIC Blood Culture results may not be optimal due to an inadequate volume of blood received in culture bottles   Culture   Final    NO GROWTH 4 DAYS Performed at [facility], [address], [place], [place] [address]    Report Status PENDING  Incomplete  Urine Culture     Status: None   Collection Time: 06/04/22 12:20 AM   Specimen: Urine, Random  Result Value  Ref Range Status   Specimen Description   Final    URINE, RANDOM Performed at [facility], [address], [place], [place] [address]    Special Requests   Final    NONE Performed at [facility], [address] [place], [place] [address]    Culture   Final    NO GROWTH Performed at [facility], [place] [address], [place], [place] [address]    Report Status 06/05/2022 FINAL  Final  SARS Coronavirus 2 by RT PCR (hospital order, performed in [facility] hospital lab) *cepheid single result test*     Status: None   Collection Time: 06/04/22 12:20 AM   Specimen: Nasal Swab  Result Value Ref Range Status   SARS Coronavirus 2 by RT PCR NEGATIVE NEGATIVE Final    Comment: (NOTE) SARS-CoV-2 target nucleic acids are NOT DETECTED.  The SARS-CoV-2 RNA is generally detectable in upper and lower respiratory specimens during the acute phase of infection. The lowest concentration of SARS-CoV-2 viral copies this assay can detect is 250 copies / mL. A negative result does not preclude SARS-CoV-2 infection and should not be used as the sole basis for treatment or other patient management decisions.  A negative result may occur with improper specimen collection / handling, submission of specimen other than nasopharyngeal swab, presence of viral mutation(s) within the areas targeted by  this assay, and inadequate number of viral copies (<250 copies / mL). A negative result must be combined with clinical observations, patient history, and epidemiological information.  Fact Sheet for Patients:   https://www.patel.info/  Fact Sheet for Healthcare Providers: https://hall.com/  This test is not yet approved or  cleared by the [place] FDA and has been authorized for detection and/or diagnosis of SARS-CoV-2 by FDA under an Emergency Use Authorization (EUA).  This EUA will remain in effect (meaning this test can be used) for the  duration of the COVID-19 declaration under Section 564(b)(1) of the Act, 21 U.S.C. section 360bbb-3(b)(1), unless the authorization is terminated or revoked sooner.  Performed at [facility], [address] [place], [place] [address]          Radiology Studies: CT Soft Tissue Neck W Contrast  Result Date: 06/04/2022 CLINICAL DATA:  Hematologic malignancy EXAM: CT NECK WITH CONTRAST TECHNIQUE: Multidetector CT imaging of the neck was performed using the standard protocol following the bolus administration of intravenous contrast. RADIATION DOSE REDUCTION: This exam was performed according to the departmental dose-optimization program which includes automated exposure control, adjustment of the mA and/or kV according to patient size and/or use of iterative reconstruction technique. CONTRAST:  28m OMNIPAQUE IOHEXOL 350 MG/ML SOLN COMPARISON:  05/15/2022 FINDINGS: PHARYNX AND LARYNX: Rightward deviation of the pharynx and upper larynx is unchanged. There is persistent left asymmetric soft tissue thickening at the larynx. The airway is maintained. Post treatment thickening of the epiglottis. Unchanged small retropharyngeal effusion. SALIVARY GLANDS: Post treatment hyperenhancement of the salivary glands is unchanged. THYROID: Normal. LYMPH NODES: Conglomerate nodal mass at left level 2A with areas of central necrosis is unchanged allowing for differences in scan quality and contrast bolus timing. 5 mm right level 1A node is also unchanged. No new lymphadenopathy. VASCULAR: There is a right chest wall Port-A-Cath the terminates below the field of view. The left internal jugular vein remains occluded. There is narrowing of the distal left ICA due to mass effect by the surrounding soft tissues, unchanged. LIMITED INTRACRANIAL: Normal. VISUALIZED ORBITS: Normal. MASTOIDS AND VISUALIZED PARANASAL SINUSES: No fluid levels or advanced mucosal thickening. No mastoid effusion. SKELETON: No bony spinal  canal stenosis. No lytic or blastic lesions. UPPER CHEST: Clear. OTHER: Unchanged cutaneous lesion of the left retroauricular region. IMPRESSION: 1. Unchanged appearance of confluent nodal mass at left level 2A with areas of central necrosis. 2. Unchanged 5 mm right level 1A node. 3. Unchanged cutaneous lesion of the left retroauricular region. 4. 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 Electronically Signed   By: KUlyses JarredM.D.   On: 06/04/2022 00:18   CT HEAD WO CONTRAST (5MM)  Result Date: 06/03/2022 CLINICAL DATA:  Altered mental status, head and neck carcinoma EXAM: CT HEAD WITHOUT CONTRAST TECHNIQUE: Contiguous axial images were obtained from the base of the skull through the vertex without intravenous contrast. RADIATION DOSE REDUCTION: This exam was performed according to the departmental dose-optimization program which includes automated exposure control, adjustment of the mA and/or kV according to patient size and/or use of iterative reconstruction technique. COMPARISON:  MRI 05/16/2022 FINDINGS: Brain: Normal anatomic configuration. Parenchymal volume loss is commensurate with the patient's age. Mild periventricular white matter changes are present likely reflecting the sequela of small vessel ischemia. No abnormal intra or extra-axial mass lesion or fluid collection. No abnormal mass effect or midline shift. No evidence of acute intracranial hemorrhage or infarct. Ventricular size is normal. Cerebellum unremarkable. Vascular: No asymmetric hyperdense  vasculature at the skull base. Skull: Intact Sinuses/Orbits: Paranasal sinuses are clear. Orbits are unremarkable. Other: Mastoid air cells and middle ear cavities are clear. Left retroauricular soft tissue mass is partially visualized at the inferior margin of the examination, better assessed on CT examination of the neck on 05/15/2022. IMPRESSION: 1. No acute intracranial abnormality. 2. Left retroauricular soft tissue mass partially  visualized, better assessed on CT examination of the neck on 05/15/2022. Electronically Signed   By: Fidela Salisbury M.D.   On: 06/03/2022 22:12   DG Chest Port 1 View  Result Date: 06/03/2022 CLINICAL DATA:  Questionable sepsis EXAM: PORTABLE CHEST 1 VIEW COMPARISON:  05/15/2022 FINDINGS: The heart size and mediastinal contours are within normal limits. Right chest port catheter. Both lungs are clear. The visualized skeletal structures are unremarkable. IMPRESSION: No acute abnormality of the lungs in AP portable projection. Electronically Signed   By: Delanna Ahmadi M.D.   On: 06/03/2022 21:20            LOS: 3 days        Emeterio Reeve, DO [facility] 06/07/2022, 2:33 PM   Staff may message me via secure chat in [place]  but this may not receive immediate response,  please page for urgent matters!  If 7PM-7AM, please contact night-coverage [email or web address]  Dictation software was used to generate the above note. Typos may occur and escape review, as with typed/written notes. Please contact Dr Sheppard Coil directly for clarity if needed.

## 2022-06-07 NOTE — Consult Note (Addendum)
Consultation Note Date: 06/07/2022   Patient Name: Christina Porter. 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  She presented in the ED in [place] where she presented with chest congestion, shortness of breath and hemoptysis.  CTA chest was negative for PE and admission was advised but patient left AMA. She presented in the [facility] ED 05/16/2022 with c/o: disorientation, hypertension.    Clinical Assessment and Goals of Care: Notes and labs reviewed. In to see patient. She has a Actuary at bedside with her.   She states she lives alone. She states she is married but almost divorced. She quickly states her son is her HPOA. At home she spends much of her time on the couch or in a chair.   She discusses her cancer diagnosis. As I talk with her at beside and she speaks, noisy, deliberate breathing was noted that sounds to be mildly stridorous. She states this is normal for her and she has already been approached about a tracheostomy in the past. She states she had initially stated she did not want a tracheostomy, but now wants to do what she can to live, and would accept one if it were needed in the future. She states her oncologists have advised that they believe they can treat her cancer and they would fight, fight, fight, and she  agrees with this. She would like all care possible including CPR if her heart and breathing stop.    SUMMARY OF RECOMMENDATIONS   Full code/full scope       Primary Diagnoses: Present on Admission:  Throat cancer ([place])  Internal jugular vein thrombosis, left (HCC)  Bipolar 1 disorder (HCC)  Tobacco abuse   I have reviewed the medical record, interviewed the patient and family, and examined the patient. The following aspects are pertinent.  Past Medical History:  Diagnosis Date   Bipolar 1 disorder ([place])    Seizures ([place])    Social History   Socioeconomic History   Marital status: Married    Spouse name: Not on file   Number of children: Not on file   Years of education: Not on file   Highest education level: Not on file  Occupational History   Not on file  Tobacco Use   Smoking status: Every Day    Types: Cigarettes   Smokeless tobacco: Never  Vaping Use   Vaping Use: Never used  Substance and Sexual Activity   Alcohol use: Yes   Drug use: Never  Sexual activity: Not on file  Other Topics Concern   Not on file  Social History Narrative   ** Merged History Encounter **       Social Determinants of Health   Financial Resource Strain: Not on file  Food Insecurity: Not on file  Transportation Needs: Not on file  Physical Activity: Not on file  Stress: Not on file  Social Connections: Not on file   Family History  Family history unknown: Yes   Scheduled Meds:  feeding supplement (OSMOLITE 1.5 CAL)  237 mL Per Tube QID   feeding supplement (PROSource TF)  45 mL Per Tube BID   gabapentin  600 mg Per Tube TID   heparin flush  10 Units Intracatheter Once   morphine  10 mg Per Tube Q6H   multivitamin  15 mL Per Tube Daily   QUEtiapine  100 mg Per Tube QHS   traZODone  100 mg Per Tube QHS   Continuous Infusions:  [START ON 06/08/2022] ampicillin-sulbactam (UNASYN) IV     fluconazole (DIFLUCAN) IV Stopped (06/06/22 2254)   lactated ringers 100 mL/hr at  06/07/22 1444   piperacillin-tazobactam (ZOSYN)  IV 3.375 g (06/07/22 1259)   PRN Meds:.acetaminophen (TYLENOL) oral liquid 160 mg/5 mL, acetaminophen **OR** acetaminophen, ondansetron **OR** ondansetron (ZOFRAN) IV, oxyCODONE, sodium chloride Medications Prior to Admission:  Prior to Admission medications   Medication Sig Start Date End Date Taking? Authorizing Provider  acetaminophen (TYLENOL) 325 MG tablet Take 2 tablets (650 mg total) by mouth every 4 (four) hours as needed for mild pain (or Fever >/= 101). 05/19/22  Yes Dwyane Dee, MD  gabapentin (NEURONTIN) 250 MG/5ML solution Take 12 mLs by mouth in the morning, at noon, and at bedtime. 05/24/22  Yes [provider]  morphine (MS CONTIN) 15 MG 12 hr tablet Take 1 tablet (15 mg total) by mouth 3 (three) times daily. 05/19/22  Yes Dwyane Dee, MD  oxyCODONE (ROXICODONE) 5 MG immediate release tablet Take 1 tablet (5 mg total) by mouth every 4 (four) hours as needed. 05/19/22 05/19/23 Yes Dwyane Dee, MD  oxyCODONE (ROXICODONE) 5 MG/5ML solution Take 5-10 mLs by mouth every 4 (four) hours as needed. 05/23/22  Yes [provider]  polyethylene glycol (MIRALAX / GLYCOLAX) 17 g packet SMARTSIG:1 Packet(s) By Mouth PRN 05/23/22  Yes [provider]  QUEtiapine (SEROQUEL) 100 MG tablet Take 100 mg by mouth at bedtime. 02/13/22  Yes [provider]  traZODone (DESYREL) 100 MG tablet Take 100 mg by mouth at bedtime. 02/13/22  Yes [provider]  gabapentin (NEURONTIN) 300 MG capsule Take 2 capsules (600 mg total) by mouth 3 (three) times daily. Patient not taking: Reported on 06/04/2022 05/19/22   Dwyane Dee, MD  naloxone [facility] nasal spray 4 mg/0.1 mL SMARTSIG:1 Both Nares Daily 05/24/22   [provider]   Allergies  Allergen Reactions   Benadryl [Diphenhydramine] Other (See Comments)    Somnolence when given 25 mg IV; consider low dose administration   Review of Systems   Constitutional:        Mouth and neck pain    Physical Exam Pulmonary:     Comments: Deliberate, noisy breathing noted, sounds like stridor. Neurological:     Mental Status: She is alert.     Vital Signs: BP 117/74 (BP Location: Right Arm)   Pulse (!) 109   Temp 99.4 F (37.4 C) (Axillary)   Resp 18   Ht '5\' 2"'$  (1.575 m)   Wt 58.5  kg   SpO2 95%   BMI 23.58 kg/m  Pain Scale: 0-10 POSS *See Group Information*: 1-Acceptable,Awake and alert Pain Score: 0-No pain   SpO2: SpO2: 95 % O2 Device:SpO2: 95 % O2 Flow Rate: .O2 Flow Rate (L/min): 2 L/min  IO: Intake/output summary:  Intake/Output Summary (Last 24 hours) at 06/07/2022 1602 Last data filed at 06/07/2022 2641 Gross per 24 hour  Intake 1829.25 ml  Output 300 ml  Net 1529.25 ml    LBM: Last BM Date : 06/06/22 Baseline Weight: Weight: 42 kg Most recent weight: Weight: 58.5 kg      Signed by: Asencion Gowda, NP   Please contact Palliative Medicine Team phone at [phone] for questions and concerns.  For individual provider: See Shea Evans

## 2022-06-08 DIAGNOSIS — R636 Underweight: Secondary | ICD-10-CM

## 2022-06-08 DIAGNOSIS — J690 Pneumonitis due to inhalation of food and vomit: Secondary | ICD-10-CM | POA: Diagnosis not present

## 2022-06-08 DIAGNOSIS — I82C12 Acute embolism and thrombosis of left internal jugular vein: Secondary | ICD-10-CM | POA: Diagnosis not present

## 2022-06-08 DIAGNOSIS — C760 Malignant neoplasm of head, face and neck: Secondary | ICD-10-CM | POA: Diagnosis not present

## 2022-06-08 DIAGNOSIS — R6510 Systemic inflammatory response syndrome (SIRS) of non-infectious origin without acute organ dysfunction: Secondary | ICD-10-CM | POA: Diagnosis not present

## 2022-06-08 DIAGNOSIS — Z7189 Other specified counseling: Secondary | ICD-10-CM | POA: Diagnosis not present

## 2022-06-08 LAB — PHOSPHORUS: Phosphorus: 2.5 mg/dL (ref 2.5–4.6)

## 2022-06-08 LAB — CULTURE, BLOOD (ROUTINE X 2)
Culture: NO GROWTH
Culture: NO GROWTH
Special Requests: ADEQUATE

## 2022-06-08 LAB — CBC
HCT: 29 % — ABNORMAL LOW (ref 36.0–46.0)
Hemoglobin: 9.5 g/dL — ABNORMAL LOW (ref 12.0–15.0)
MCH: 30.6 pg (ref 26.0–34.0)
MCHC: 32.8 g/dL (ref 30.0–36.0)
MCV: 93.5 fL (ref 80.0–100.0)
Platelets: 294 10*3/uL (ref 150–400)
RBC: 3.1 MIL/uL — ABNORMAL LOW (ref 3.87–5.11)
RDW: 14.6 % (ref 11.5–15.5)
WBC: 15.9 10*3/uL — ABNORMAL HIGH (ref 4.0–10.5)
nRBC: 0 % (ref 0.0–0.2)

## 2022-06-08 LAB — MAGNESIUM: Magnesium: 1.4 mg/dL — ABNORMAL LOW (ref 1.7–2.4)

## 2022-06-08 LAB — BASIC METABOLIC PANEL
Anion gap: 7 (ref 5–15)
BUN: 7 mg/dL — ABNORMAL LOW (ref 8–23)
CO2: 30 mmol/L (ref 22–32)
Calcium: 8.3 mg/dL — ABNORMAL LOW (ref 8.9–10.3)
Chloride: 92 mmol/L — ABNORMAL LOW (ref 98–111)
Creatinine, Ser: <0.3 mg/dL — ABNORMAL LOW (ref 0.44–1.00)
Glucose, Bld: 131 mg/dL — ABNORMAL HIGH (ref 70–99)
Potassium: 3.6 mmol/L (ref 3.5–5.1)
Sodium: 129 mmol/L — ABNORMAL LOW (ref 135–145)

## 2022-06-08 LAB — GLUCOSE, CAPILLARY
Glucose-Capillary: 149 mg/dL — ABNORMAL HIGH (ref 70–99)
Glucose-Capillary: 122 mg/dL — ABNORMAL HIGH (ref 70–99)
Glucose-Capillary: 148 mg/dL — ABNORMAL HIGH (ref 70–99)
Glucose-Capillary: 157 mg/dL — ABNORMAL HIGH (ref 70–99)
Glucose-Capillary: 154 mg/dL — ABNORMAL HIGH (ref 70–99)

## 2022-06-08 NOTE — Progress Notes (Signed)
ID No change in her status from yesterday Has difficulty swallowing Some Resp distress   BP (!) 151/80 (BP Location: Right Arm)   Pulse (!) 105   Temp 98.2 F (36.8 C) (Axillary)   Resp 19   Ht '5\' 2"'$  (1.575 m)   Wt 58.4 kg   SpO2 98%   BMI 23.57 kg/m   Somnolent But wakes up on calling her name Talks Hoarse voice Emaciated Chronically ill Chest b/l air entry Rhonchi B/l [id number] Tachycardia Left neck fungating mass  Labs    Latest Ref Rng & Units 06/08/2022    4:54 AM 06/06/2022    5:15 PM 06/06/2022    4:49 AM  CBC  WBC 4.0 - 10.5 K/uL 15.9   18.8   Hemoglobin 12.0 - 15.0 g/dL 9.5  9.2  7.7   Hematocrit 36.0 - 46.0 % 29.0  27.9  24.7   Platelets 150 - 400 K/uL 294   367        Latest Ref Rng & Units 06/08/2022    4:54 AM 06/06/2022    4:49 AM 06/05/2022    5:10 AM  CMP  Glucose 70 - 99 mg/dL 131  131  112   BUN 8 - 23 mg/dL '7  9  5   '$ Creatinine 0.44 - 1.00 mg/dL <0.30  0.57  0.54   Sodium 135 - 145 mmol/L 129  135  139   Potassium 3.5 - 5.1 mmol/L 3.6  5.0  4.5   Chloride 98 - 111 mmol/L 92  100  100   CO2 22 - 32 mmol/L '30  29  30   '$ Calcium 8.9 - 10.3 mg/dL 8.3  8.5  8.6   Total Protein 6.5 - 8.1 g/dL  5.6    Total Bilirubin 0.3 - 1.2 mg/dL  0.3    Alkaline Phos 38 - 126 U/L  92    AST 15 - 41 U/L  15    ALT 0 - 44 U/L  9      Impression/recommendation 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  Also extension into soft tissue of the neck with obstruction of left IJ and also compression of the ICA. High risk for erosion into vessels, lemierre's syndrome, tumor extension into sigmoid sinus and cavernous sinus. 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 Patient leukocytosis secondary to the fungating mass as well as aspiration. On unasyn-   Appreciate  Dr.McQueen's consult-  She is advanced  cancer with not much of treatment options  Acute hypoxic respiratory failure secondary to the above   COPD  Current smoker  Anemia ? Pt awaiting transfer to [facility] is covering Friday/Saturday and Sunday Available by phone for urgent issues

## 2022-06-08 NOTE — Progress Notes (Signed)
Reviewed chart and had further discussion with RN regarding best access for pt at this time. Determined best recommendation is Peripheral IV site due to prior documented history of AMA departure with port accessed. RN is still concerned that this could be a potential issue. Chose not to access port for patient safety.

## 2022-06-08 NOTE — Progress Notes (Signed)
Daily Progress Note   Patient Name: Christina Porter. Christina Porter       Date: 06/08/2022 DOB: 10/08/1961  Age: 61 y.o. MRN#: [id number] Attending Physician: Emeterio Reeve, DO Primary Care Physician: Neomia Dear, MD Admit Date: 06/03/2022  Reason for Consultation/Follow-up: Establishing goals of care  Subjective: Spoke with attending and with oncology palliative NP. Discussed conversation with patient yesterday with both.   Patient's prognosis is very poor however patient has verbalized that her oncologist feels that they can cure her cancer, and that she needs to fight. I  suggest the patient and son speak with their oncologist and verbalize their perception of expected outcomes, and have a frank conversation regarding her status and prognosis.   Length of Stay: 4  Current Medications: Scheduled Meds:   feeding supplement (OSMOLITE 1.5 CAL)  237 mL Per Tube QID   feeding supplement (PROSource TF)  45 mL Per Tube BID   gabapentin  600 mg Per Tube TID   heparin flush  10 Units Intracatheter Once   morphine  10 mg Per Tube Q6H   multivitamin  15 mL Per Tube Daily   QUEtiapine  100 mg Per Tube QHS   traZODone  100 mg Per Tube QHS    Continuous Infusions:  ampicillin-sulbactam (UNASYN) IV 3 g (06/08/22 1144)   fluconazole (DIFLUCAN) IV Stopped (06/07/22 2123)   lactated ringers 100 mL/hr at 06/08/22 0913    PRN Meds: acetaminophen (TYLENOL) oral liquid 160 mg/5 mL, acetaminophen **OR** acetaminophen, ondansetron **OR** ondansetron (ZOFRAN) IV, oxyCODONE, sodium chloride  Physical Exam Neurological:     Mental Status: She is alert.             Vital Signs: BP 134/86 (BP Location: Right Arm)   Pulse 100   Temp 97.6 F (36.4 C) (Oral)   Resp 18   Ht '5\' 2"'$  (1.575 m)   Wt 58.4  kg   SpO2 92%   BMI 23.57 kg/m  SpO2: SpO2: 92 % O2 Device: O2 Device: Nasal Cannula O2 Flow Rate: O2 Flow Rate (L/min): 3 L/min  Intake/output summary:  Intake/Output Summary (Last 24 hours) at 06/08/2022 1353 Last data filed at 06/08/2022 0913 Gross per 24 hour  Intake 2456.56 ml  Output --  Net 2456.56 ml   LBM: Last BM Date : 06/08/22 Baseline Weight:  Weight: 42 kg Most recent weight: Weight: 58.4 kg   Patient Active Problem List   Diagnosis Date Noted   Pressure injury of skin 06/07/2022   Head and neck cancer (HCC)    Hyponatremia 06/04/2022   SIRS (systemic inflammatory response syndrome) (HCC) 06/04/2022   Abnormal urinalysis 06/04/2022   Cancer associated pain 06/04/2022   Underweight 06/04/2022   Possible Seizure disorder ([place]) 05/16/2022   Bipolar 1 disorder ([place]) 05/16/2022   Tobacco abuse 05/16/2022   Throat cancer ([place]) 05/16/2022   Hypokalemia 05/16/2022   Protein-calorie malnutrition, severe 05/16/2022   Internal jugular vein thrombosis, left ([place]) 05/16/2022    Palliative Care Assessment & Plan   Recommendations/Plan:  Recommend patient and son speak with their oncologist to verbalize their perception of expected outcomes, and discuss her status and prognosis.     Code Status:    Code Status Orders  (From admission, onward)           Start     Ordered   06/04/22 0102  Full code  Continuous        06/04/22 0102           Code Status History     Date Active Date Inactive Code Status Order ID Comments User Context   05/16/2022 0202 05/19/2022 1800 Full Code [id number]  Mansy, Arvella Merles, MD ED      Advance Directive Documentation    Flowsheet Row Most Recent Value  Type of Advance Directive Healthcare Power of Attorney  Pre-existing out of facility DNR order (yellow form or pink MOST form) --  "MOST" Form in Place? --       Prognosis:  Very poor     Care plan was discussed with Primary MD and onc/palliative NP  Thank you  for allowing the Palliative Medicine Team to assist in the care of this patient.  Asencion Gowda, NP  Please contact Palliative Medicine Team phone at [phone] for questions and concerns.

## 2022-06-08 NOTE — H&P (View-Only) (Signed)
[facility] Consult Note  MRN : [id number]  Christina Porter is a 61 y.o. ([birth year]) female who presents with chief complaint of  Chief Complaint  Patient presents with   Vascular Access Problem  .   Consulting Physician: Emeterio Reeve, DO Reason for consult: Extensive neck cancer History of Present Illness: The patient is a 61 year old female that has a past medical history significant for bipolar 1 disorder, seizures and metastatic throat cancer.  She initially presented due to having worsening weakness and what was initially felt to be a seizure.  The patient has been undergoing treatment for head and neck cancer at [facility].  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  The patient is noted to have an extensive fungating ulcerative lesion.  The patient currently does not have active bleeding however there is a significant risk for acute arterial bleeding from tumor erosion as well as tumor necrosis from her treatment.    Current Facility-Administered Medications  Medication Dose Route Frequency Provider Last Rate Last Admin   acetaminophen (TYLENOL) 160 MG/5ML solution 650 mg  650 mg Per Tube Q6H PRN Athena Masse, MD   650 mg at 06/08/22 2030   acetaminophen (TYLENOL) tablet 650 mg  650 mg Oral Q6H PRN Athena Masse, MD   650 mg at 06/07/22 1522   Or   acetaminophen (TYLENOL) suppository 650 mg  650 mg Rectal Q6H PRN Athena Masse, MD       Ampicillin-Sulbactam (UNASYN) 3 g in sodium chloride 0.9 % 100 mL IVPB  3 g Intravenous Q6H Ravishankar, Joellyn Quails, MD 200 mL/hr at 06/08/22 1706 3 g at 06/08/22 1706   feeding supplement (OSMOLITE 1.5 CAL) liquid 237 mL  237 mL Per Tube QID Shawna Clamp, MD   237 mL at 06/08/22 2031   feeding supplement (PROSource  TF) liquid 45 mL  45 mL Per Tube BID Shawna Clamp, MD   45 mL at 06/08/22 2032   fluconazole (DIFLUCAN) IVPB 200 mg  200 mg Intravenous Q24H Tsosie Billing, MD 100 mL/hr at 06/08/22 2045 200 mg at 06/08/22 2045   gabapentin (NEURONTIN) 250 MG/5ML solution 600 mg  600 mg Per Tube TID Athena Masse, MD   600 mg at 06/08/22 2045   heparin flush 10 UNIT/ML injection 10 Units  10 Units Intracatheter Once Versie Starks, PA-C       lactated ringers infusion   Intravenous Continuous Athena Masse, MD 100 mL/hr at 06/08/22 2028 New Bag at 06/08/22 2028   morphine 10 MG/5ML solution 10 mg  10 mg Per Tube Q6H Athena Masse, MD   10 mg at 06/08/22 1412   multivitamin liquid 15 mL  15 mL Per Tube Daily Hallaji, Sheema M, RPH   15 mL at 06/08/22 0930   ondansetron (ZOFRAN) tablet 4 mg  4 mg Oral Q6H PRN Athena Masse, MD       Or   ondansetron [facility]) injection 4 mg  4 mg Intravenous Q6H PRN Athena Masse, MD   4 mg at 06/08/22 2030   oxyCODONE (ROXICODONE) 5 MG/5ML solution 5 mg  5 mg Per Tube Q4H PRN Athena Masse, MD   5 mg at 06/07/22 1602   QUEtiapine (SEROQUEL) tablet 100 mg  100  mg Per Tube QHS Judd Gaudier V, MD   100 mg at 06/08/22 2030   sodium chloride (OCEAN) 0.65 % nasal spray 1 spray  1 spray Each Nare PRN Emeterio Reeve, DO   1 spray at 06/07/22 1233   traZODone (DESYREL) tablet 100 mg  100 mg Per Tube QHS Athena Masse, MD   100 mg at 06/08/22 2030    Past Medical History:  Diagnosis Date   Bipolar 1 disorder ([place])    Seizures ([place])     Past Surgical History:  Procedure Laterality Date   CESAREAN SECTION      Social History Social History   Tobacco Use   Smoking status: Every Day    Types: Cigarettes   Smokeless tobacco: Never  Vaping Use   Vaping Use: Never used  Substance Use Topics   Alcohol use: Yes   Drug use: Never    Family History Family History  Family history unknown: Yes    Allergies  Allergen Reactions   Benadryl  [Diphenhydramine] Other (See Comments)    Somnolence when given 25 mg IV; consider low dose administration     REVIEW OF SYSTEMS (Negative unless checked) Patient currently poor historian unable to conduct review of systems  Physical Examination  Vitals:   06/08/22 0713 06/08/22 1113 06/08/22 1636 06/08/22 1938  BP: 115/72 134/86 134/79 (!) 151/80  Pulse: (!) 106 100 100 (!) 105  Resp: '17 18 18 19  '$ Temp: 97.7 F (36.5 C) 97.6 F (36.4 C) 97.6 F (36.4 C) 98.2 F (36.8 C)  TempSrc: Axillary Oral  Axillary  SpO2: 100% 92% 96% 98%  Weight:      Height:       Body mass index is 23.57 kg/m. Gen:  WD/WN, NAD, frail-appearing Head: [place]/AT, No temporalis wasting. Prominent temp pulse not noted. Ear/Nose/Throat: Swelling in left face, neck Eyes: Sclera non-icteric, conjunctiva clear Neck: Trachea midline.  No JVD.  Pulmonary:  Good air movement, respirations not labored, equal bilaterally.  Cardiac: RRR, normal S1, S2. Vascular:  Vessel Right Left  Radial Palpable Palpable   Gastrointestinal: soft, non-tender/non-distended. No guarding/reflex.  Musculoskeletal: M/S 5/5 throughout.  Extremities without ischemic changes.  No deformity or atrophy. No edema. Neurologic: Alert Psychiatric: Oriented x 2    CBC Lab Results  Component Value Date   WBC 15.9 (H) 06/08/2022   HGB 9.5 (L) 06/08/2022   HCT 29.0 (L) 06/08/2022   MCV 93.5 06/08/2022   PLT 294 06/08/2022    BMET    Component Value Date/Time   NA 129 (L) 06/08/2022 0454   NA 135 (L) 08/31/2013 0436   K 3.6 06/08/2022 0454   K 3.7 08/31/2013 0436   CL 92 (L) 06/08/2022 0454   CL 105 08/31/2013 0436   CO2 30 06/08/2022 0454   CO2 23 08/31/2013 0436   GLUCOSE 131 (H) 06/08/2022 0454   GLUCOSE 84 08/31/2013 0436   BUN 7 (L) 06/08/2022 0454   BUN 3 (L) 08/31/2013 0436   CREATININE <0.30 (L) 06/08/2022 0454   CREATININE 0.77 08/31/2013 0436   CALCIUM 8.3 (L) 06/08/2022 0454   CALCIUM 8.8 08/31/2013 0436    GFRNONAA NOT CALCULATED 06/08/2022 0454   GFRNONAA >60 08/31/2013 0436   GFRAA >60 08/31/2013 0436   CrCl cannot be calculated (This lab value cannot be used to calculate CrCl because it is not a number: <0.30).  COAG Lab Results  Component Value Date   INR 1.1 06/04/2022   INR 1.1 05/16/2022  Radiology DG Chest Port 1 View  Result Date: 06/06/2022 CLINICAL DATA:  61 year old female with hypoxia.  History of cancer. EXAM: PORTABLE CHEST 1 VIEW COMPARISON:  Portable chest 06/03/2022 and earlier. FINDINGS: Portable AP upright view at 0424 hours. Stable right chest power port. Mildly lower lung volumes. Streaky and veiling new right lung base opacity. No pneumothorax. No pulmonary edema suspected. Left lung appears stable and negative. Percutaneous gastrostomy tube visible with negative upper abdominal bowel gas pattern. No acute osseous abnormality identified. IMPRESSION: New right lung base opacity which appears to be a combination of airspace disease and small new pleural effusion. Consider aspiration and/or pneumonia. Electronically Signed   By: Genevie Ann M.D.   On: 06/06/2022 04:36   CT Soft Tissue Neck W Contrast  Result Date: 06/04/2022 CLINICAL DATA:  Hematologic malignancy EXAM: CT NECK WITH CONTRAST TECHNIQUE: Multidetector CT imaging of the neck was performed using the standard protocol following the bolus administration of intravenous contrast. RADIATION DOSE REDUCTION: This exam was performed according to the departmental dose-optimization program which includes automated exposure control, adjustment of the mA and/or kV according to patient size and/or use of iterative reconstruction technique. CONTRAST:  33m OMNIPAQUE IOHEXOL 350 MG/ML SOLN COMPARISON:  05/15/2022 FINDINGS: PHARYNX AND LARYNX: Rightward deviation of the pharynx and upper larynx is unchanged. There is persistent left asymmetric soft tissue thickening at the larynx. The airway is maintained. Post treatment thickening of  the epiglottis. Unchanged small retropharyngeal effusion. SALIVARY GLANDS: Post treatment hyperenhancement of the salivary glands is unchanged. THYROID: Normal. LYMPH NODES: Conglomerate nodal mass at left level 2A with areas of central necrosis is unchanged allowing for differences in scan quality and contrast bolus timing. 5 mm right level 1A node is also unchanged. No new lymphadenopathy. VASCULAR: There is a right chest wall Port-A-Cath the terminates below the field of view. The left internal jugular vein remains occluded. There is narrowing of the distal left ICA due to mass effect by the surrounding soft tissues, unchanged. LIMITED INTRACRANIAL: Normal. VISUALIZED ORBITS: Normal. MASTOIDS AND VISUALIZED PARANASAL SINUSES: No fluid levels or advanced mucosal thickening. No mastoid effusion. SKELETON: No bony spinal canal stenosis. No lytic or blastic lesions. UPPER CHEST: Clear. OTHER: Unchanged cutaneous lesion of the left retroauricular region. IMPRESSION: 1. Unchanged appearance of confluent nodal mass at left level 2A with areas of central necrosis. 2. Unchanged 5 mm right level 1A node. 3. Unchanged cutaneous lesion of the left retroauricular region. 4. 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 Electronically Signed   By: KUlyses JarredM.D.   On: 06/04/2022 00:18   CT HEAD WO CONTRAST (5MM)  Result Date: 06/03/2022 CLINICAL DATA:  Altered mental status, head and neck carcinoma EXAM: CT HEAD WITHOUT CONTRAST TECHNIQUE: Contiguous axial images were obtained from the base of the skull through the vertex without intravenous contrast. RADIATION DOSE REDUCTION: This exam was performed according to the departmental dose-optimization program which includes automated exposure control, adjustment of the mA and/or kV according to patient size and/or use of iterative reconstruction technique. COMPARISON:  MRI 05/16/2022 FINDINGS: Brain: Normal anatomic configuration. Parenchymal volume loss is  commensurate with the patient's age. Mild periventricular white matter changes are present likely reflecting the sequela of small vessel ischemia. No abnormal intra or extra-axial mass lesion or fluid collection. No abnormal mass effect or midline shift. No evidence of acute intracranial hemorrhage or infarct. Ventricular size is normal. Cerebellum unremarkable. Vascular: No asymmetric hyperdense vasculature at the skull base. Skull: Intact Sinuses/Orbits: Paranasal sinuses  are clear. Orbits are unremarkable. Other: Mastoid air cells and middle ear cavities are clear. Left retroauricular soft tissue mass is partially visualized at the inferior margin of the examination, better assessed on CT examination of the neck on 05/15/2022. IMPRESSION: 1. No acute intracranial abnormality. 2. Left retroauricular soft tissue mass partially visualized, better assessed on CT examination of the neck on 05/15/2022. Electronically Signed   By: Fidela Salisbury M.D.   On: 06/03/2022 22:12   DG Chest Port 1 View  Result Date: 06/03/2022 CLINICAL DATA:  Questionable sepsis EXAM: PORTABLE CHEST 1 VIEW COMPARISON:  05/15/2022 FINDINGS: The heart size and mediastinal contours are within normal limits. Right chest port catheter. Both lungs are clear. The visualized skeletal structures are unremarkable. IMPRESSION: No acute abnormality of the lungs in AP portable projection. Electronically Signed   By: Delanna Ahmadi M.D.   On: 06/03/2022 21:20   MR Brain W and Wo Contrast  Result Date: 05/16/2022 CLINICAL DATA:  Seizures and balance loss. History of head neck carcinoma. EXAM: MRI HEAD WITHOUT AND WITH CONTRAST TECHNIQUE: Multiplanar, multiecho pulse sequences of the brain and surrounding structures were obtained without and with intravenous contrast. CONTRAST:  73m GADAVIST GADOBUTROL 1 MMOL/ML IV SOLN COMPARISON:  None Available. FINDINGS: Brain: No acute infarct, mass effect or extra-axial collection. No acute or chronic hemorrhage.  Normal white matter signal, parenchymal volume and CSF spaces. The midline structures are normal. Vascular: Major flow voids are preserved. Skull and upper cervical spine: Findings within the soft tissues of the left neck are more completely described on the concomitant CT of the neck. Briefly, there is confluent lymphadenopathy with central necrosis and a postauricular skin lesion. Normal calvarium. Sinuses/Orbits:No paranasal sinus fluid levels or advanced mucosal thickening. No mastoid or middle ear effusion. Normal orbits. IMPRESSION: 1. Normal brain.  No intracranial metastatic disease. 2. Findings of disease spread within the soft tissues of the left neck are more completely described on the concomitant CT of the neck. Electronically Signed   By: KUlyses JarredM.D.   On: 05/16/2022 01:04   CT Soft Tissue Neck W Contrast  Result Date: 05/15/2022 CLINICAL DATA:  Soft tissue swelling. History of head neck carcinoma. EXAM: CT NECK WITH CONTRAST TECHNIQUE: Multidetector CT imaging of the neck was performed using the standard protocol following the bolus administration of intravenous contrast. RADIATION DOSE REDUCTION: This exam was performed according to the departmental dose-optimization program which includes automated exposure control, adjustment of the mA and/or kV according to patient size and/or use of iterative reconstruction technique. CONTRAST:  798mOMNIPAQUE IOHEXOL 300 MG/ML  SOLN COMPARISON:  None Available. FINDINGS: PHARYNX AND LARYNX: Diffuse edema of the soft tissues of the oropharynx, hypopharynx and larynx, including the epiglottis. There is a small retropharyngeal effusion but no retropharyngeal or peritonsillar abscess. SALIVARY GLANDS: Increased salivary gland enhancement is likely a post treatment effect. THYROID: Normal. LYMPH NODES: Left level 2A nodal conglomerate with area of central necrosis that measures approximately 1.4 cm. There is an enhancing 5 mm right level 1A node. VASCULAR:  The left internal jugular vein is thrombosed. There is abnormal contrast enhancement surrounding its course in the left neck, just distal to the carotid bifurcation. The other major vessels are patent. There is a right chest wall Port-A-Cath with its tip below the field of view. LIMITED INTRACRANIAL: Normal. VISUALIZED ORBITS: Normal. MASTOIDS AND VISUALIZED PARANASAL SINUSES: No fluid levels or advanced mucosal thickening. No mastoid effusion. SKELETON: No bony spinal canal stenosis. No lytic or blastic  lesions. UPPER CHEST: Clear. OTHER: There is a left retro auricular skin lesion measuring 2.5 x 1.3 cm. IMPRESSION: 1. Area of abnormal contrast enhancement at left level 2A, likely nodal conglomerate with areas of necrosis. There is likely extranodal extension with a cutaneous lesion of the retroauricular soft tissue. 2. Diffuse edema of the soft tissues of the oropharynx, hypopharynx and larynx, likely sequela of radiation therapy. 3. 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 Electronically Signed   By: Ulyses Jarred M.D.   On: 05/15/2022 23:03   DG Chest 2 View  Result Date: 05/15/2022 CLINICAL DATA:  Weakness EXAM: CHEST - 2 VIEW COMPARISON:  Chest radiograph 08/31/2013 FINDINGS: A right chest wall port is in place with tip terminating in the right atrium. The cardiomediastinal silhouette is normal. There is no focal consolidation or pulmonary edema. There is no pleural effusion or pneumothorax There is no acute osseous abnormality. IMPRESSION: No radiographic evidence of acute cardiopulmonary process. Electronically Signed   By: Valetta Mole M.D.   On: 05/15/2022 18:19   CT Head Wo Contrast  Result Date: 05/15/2022 CLINICAL DATA:  Slurred speech facial droop multiple falls EXAM: CT HEAD WITHOUT CONTRAST CT CERVICAL SPINE WITHOUT CONTRAST TECHNIQUE: Multidetector CT imaging of the head and cervical spine was performed following the standard protocol without intravenous contrast. Multiplanar CT image  reconstructions of the cervical spine were also generated. RADIATION DOSE REDUCTION: This exam was performed according to the departmental dose-optimization program which includes automated exposure control, adjustment of the mA and/or kV according to patient size and/or use of iterative reconstruction technique. COMPARISON:  None Available. FINDINGS: CT HEAD FINDINGS Brain: No evidence of acute infarction, hemorrhage, cerebral edema, mass, mass effect, or midline shift. No hydrocephalus or extra-axial fluid collection. Vascular: No hyperdense vessel. Skull: Normal. Negative for fracture or focal lesion. Sinuses/Orbits: Mild mucosal thickening in the maxillary sinuses. The orbits are unremarkable. Other: The mastoid air cells are well aerated. CT CERVICAL SPINE FINDINGS Alignment: Reversal of the normal cervical lordosis. No traumatic listhesis. Skull base and vertebrae: No acute fracture or suspicious osseous lesion. Soft tissues and spinal canal: No prevertebral fluid or swelling. No visible canal hematoma. Disc levels: No high-grade spinal canal stenosis or neural foraminal narrowing. Upper chest: No focal pulmonary opacity or pleural effusion. Other: Evaluation of the neck soft tissues is limited by the absence of intravenous contrast. Within this limitation, abnormal soft tissue in the left parapharyngeal space, in the region of left level 2A lymph nodes, extending into the left paravertebral musculature, and an exophytic lesion posterior to the left ear are favored to be related to the patient's known throat cancer. IMPRESSION: 1.  No acute intracranial process. 2.  No acute fracture or traumatic listhesis in the cervical spine. 3. Evaluation of the neck soft tissues is limited by the absence of intravenous contrast. Within this limitation, there are changes in the left neck that are likely related to the patient's throat cancer. Electronically Signed   By: Merilyn Baba M.D.   On: 05/15/2022 18:13   CT  Cervical Spine Wo Contrast  Result Date: 05/15/2022 CLINICAL DATA:  Slurred speech facial droop multiple falls EXAM: CT HEAD WITHOUT CONTRAST CT CERVICAL SPINE WITHOUT CONTRAST TECHNIQUE: Multidetector CT imaging of the head and cervical spine was performed following the standard protocol without intravenous contrast. Multiplanar CT image reconstructions of the cervical spine were also generated. RADIATION DOSE REDUCTION: This exam was performed according to the departmental dose-optimization program which includes automated exposure control, adjustment  of the mA and/or kV according to patient size and/or use of iterative reconstruction technique. COMPARISON:  None Available. FINDINGS: CT HEAD FINDINGS Brain: No evidence of acute infarction, hemorrhage, cerebral edema, mass, mass effect, or midline shift. No hydrocephalus or extra-axial fluid collection. Vascular: No hyperdense vessel. Skull: Normal. Negative for fracture or focal lesion. Sinuses/Orbits: Mild mucosal thickening in the maxillary sinuses. The orbits are unremarkable. Other: The mastoid air cells are well aerated. CT CERVICAL SPINE FINDINGS Alignment: Reversal of the normal cervical lordosis. No traumatic listhesis. Skull base and vertebrae: No acute fracture or suspicious osseous lesion. Soft tissues and spinal canal: No prevertebral fluid or swelling. No visible canal hematoma. Disc levels: No high-grade spinal canal stenosis or neural foraminal narrowing. Upper chest: No focal pulmonary opacity or pleural effusion. Other: Evaluation of the neck soft tissues is limited by the absence of intravenous contrast. Within this limitation, abnormal soft tissue in the left parapharyngeal space, in the region of left level 2A lymph nodes, extending into the left paravertebral musculature, and an exophytic lesion posterior to the left ear are favored to be related to the patient's known throat cancer. IMPRESSION: 1.  No acute intracranial process. 2.  No  acute fracture or traumatic listhesis in the cervical spine. 3. Evaluation of the neck soft tissues is limited by the absence of intravenous contrast. Within this limitation, there are changes in the left neck that are likely related to the patient's throat cancer. Electronically Signed   By: Merilyn Baba M.D.   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  In order to attempt to prevent catastrophic hemorrhage, we can perform external carotid artery embolization with covered stent placement in the common internal carotid arteries.  This would significantly reduce the risk of bleeding.  However with this it would pose significant risk of thrombosis given the tumor encasement.  That would possibly cause a stroke going forward.  The procedure itself there is also risk for stroke.  However without intervention the patient has a possible life-threatening complication of hemorrhage. I contacted the patient's son [name] to what date him including discussing the procedure as well as risk and benefits.  We discussed this as an option if the patient wished to continue to be aggressive with her treatment.  At Ryan's request I also contacted the patient's other son who is an Therapist, sports in [place].  I have also discussed the risk benefits and alternatives with him as well.  To have time to discuss this evening.  Will contact him in the morning determine if he wishes to move forward.  If so, we will plan on intervention tomorrow.  We will have the patient's tube feed boluses held in preparation.   Family Communication: Called and discussed case with son, Barbette Reichmann, and at his request contacted son Su Ley to discuss case procedure as well.  Thank you for allowing [place] to participate in the care of this patient.   Kris Hartmann, NP [facility] [phone] (Office Phone) [phone] (Office Fax) [phone] (Pager)  06/08/2022 11:36 PM  Staff may message me via secure chat in [place]  but this may not receive immediate response,  please page for urgent matters!  Dictation software was used to generate the above note. Typos may occur and escape review, as with typed/written  notes. Any error is purely unintentional.  Please contact me directly for clarity if needed.

## 2022-06-08 NOTE — Consult Note (Incomplete)
[facility] Consult Note  MRN : [id number]  Christina Porter is a 61 y.o. (1961-02-06) female who presents with chief complaint of  Chief Complaint  Patient presents with  . Vascular Access Problem  .   Consulting Physician: Reason for consult: History of Present Illness: ***  Current Facility-Administered Medications  Medication Dose Route Frequency Provider Last Rate Last Admin  . acetaminophen (TYLENOL) 160 MG/5ML solution 650 mg  650 mg Per Tube Q6H PRN Athena Masse, MD   650 mg at 06/08/22 2030  . acetaminophen (TYLENOL) tablet 650 mg  650 mg Oral Q6H PRN Athena Masse, MD   650 mg at 06/07/22 1522   Or  . acetaminophen (TYLENOL) suppository 650 mg  650 mg Rectal Q6H PRN Athena Masse, MD      . Ampicillin-Sulbactam (UNASYN) 3 g in sodium chloride 0.9 % 100 mL IVPB  3 g Intravenous Q6H Ravishankar, Joellyn Quails, MD 200 mL/hr at 06/08/22 1706 3 g at 06/08/22 1706  . feeding supplement (OSMOLITE 1.5 CAL) liquid 237 mL  237 mL Per Tube QID Shawna Clamp, MD   237 mL at 06/08/22 2031  . feeding supplement (PROSource TF) liquid 45 mL  45 mL Per Tube BID Shawna Clamp, MD   45 mL at 06/08/22 2032  . fluconazole (DIFLUCAN) IVPB 200 mg  200 mg Intravenous Q24H Tsosie Billing, MD 100 mL/hr at 06/08/22 2045 200 mg at 06/08/22 2045  . gabapentin (NEURONTIN) 250 MG/5ML solution 600 mg  600 mg Per Tube TID Athena Masse, MD   600 mg at 06/08/22 2045  . heparin flush 10 UNIT/ML injection 10 Units  10 Units Intracatheter Once Versie Starks, PA-C      . lactated ringers infusion   Intravenous Continuous Athena Masse, MD 100 mL/hr at 06/08/22 2028 New Bag at 06/08/22 2028  . morphine 10 MG/5ML solution 10 mg  10 mg Per Tube Q6H Athena Masse, MD   10 mg at 06/08/22 1412  . multivitamin liquid 15 mL  15 mL Per Tube Daily Hallaji, Sheema M, RPH   15 mL at 06/08/22 0930  . ondansetron (ZOFRAN) tablet 4 mg  4 mg Oral Q6H PRN Athena Masse, MD       Or  . ondansetron [facility]) injection 4 mg  4 mg Intravenous Q6H PRN Athena Masse, MD   4 mg at 06/08/22 2030  . oxyCODONE (ROXICODONE) 5 MG/5ML solution 5 mg  5 mg Per Tube Q4H PRN Athena Masse, MD   5 mg at 06/07/22 1602  . QUEtiapine (SEROQUEL) tablet 100 mg  100 mg Per Tube QHS Athena Masse, MD   100 mg at 06/08/22 2030  . sodium chloride (OCEAN) 0.65 % nasal spray 1 spray  1 spray Each Nare PRN Emeterio Reeve, DO   1 spray at 06/07/22 1233  . traZODone (DESYREL) tablet 100 mg  100 mg Per Tube QHS Athena Masse, MD   100 mg at 06/08/22 2030    Past Medical History:  Diagnosis Date  . Bipolar 1 disorder ([place])   . Seizures ([place])     Past Surgical History:  Procedure Laterality Date  . CESAREAN SECTION      Social History Social History   Tobacco Use  . Smoking status: Every Day    Types: Cigarettes  . Smokeless tobacco: Never  Vaping Use  . Vaping Use: Never used  Substance Use Topics  .  Alcohol use: Yes  . Drug use: Never    Family History Family History  Family history unknown: Yes    Allergies  Allergen Reactions  . Benadryl [Diphenhydramine] Other (See Comments)    Somnolence when given 25 mg IV; consider low dose administration     REVIEW OF SYSTEMS (Negative unless checked) Patient currently poor historian unable to conduct review of systems  Physical Examination  Vitals:   06/08/22 0713 06/08/22 1113 06/08/22 1636 06/08/22 1938  BP: 115/72 134/86 134/79 (!) 151/80  Pulse: (!) 106 100 100 (!) 105  Resp: '17 18 18 19  '$ Temp: 97.7 F (36.5 C) 97.6 F (36.4 C) 97.6 F (36.4 C) 98.2 F (36.8 C)  TempSrc: Axillary Oral  Axillary  SpO2: 100% 92% 96% 98%  Weight:      Height:       Body mass index is 23.57 kg/m. Gen:  WD/WN, NAD Head: [place]/AT, No temporalis wasting. Prominent temp pulse not noted. Ear/Nose/Throat: Hearing grossly intact, nares w/o erythema or drainage, oropharynx w/o Erythema/Exudate Eyes: Sclera  non-icteric, conjunctiva clear Neck: Trachea midline.  No JVD.  Pulmonary:  Good air movement, respirations not labored, equal bilaterally.  Cardiac: RRR, normal S1, S2. Vascular: *** Vessel Right Left  Radial Palpable Palpable  Ulnar Palpable Palpable  Brachial Palpable Palpable  Carotid Palpable, without bruit Palpable, without bruit  Aorta Not palpable N/A  Femoral Palpable Palpable  Popliteal Palpable Palpable  PT Palpable Palpable  DP Palpable Palpable   Gastrointestinal: soft, non-tender/non-distended. No guarding/reflex.  Musculoskeletal: M/S 5/5 throughout.  Extremities without ischemic changes.  No deformity or atrophy. No edema. Neurologic: Sensation grossly intact in extremities.  Symmetrical.  Speech is fluent. Motor exam as listed above. Psychiatric: Judgment intact, Mood & affect appropriate for pt's clinical situation. Dermatologic: No rashes or ulcers noted.  No cellulitis or open wounds. Lymph : No Cervical, Axillary, or Inguinal lymphadenopathy.    CBC Lab Results  Component Value Date   WBC 15.9 (H) 06/08/2022   HGB 9.5 (L) 06/08/2022   HCT 29.0 (L) 06/08/2022   MCV 93.5 06/08/2022   PLT 294 06/08/2022    BMET    Component Value Date/Time   NA 129 (L) 06/08/2022 0454   NA 135 (L) 08/31/2013 0436   K 3.6 06/08/2022 0454   K 3.7 08/31/2013 0436   CL 92 (L) 06/08/2022 0454   CL 105 08/31/2013 0436   CO2 30 06/08/2022 0454   CO2 23 08/31/2013 0436   GLUCOSE 131 (H) 06/08/2022 0454   GLUCOSE 84 08/31/2013 0436   BUN 7 (L) 06/08/2022 0454   BUN 3 (L) 08/31/2013 0436   CREATININE <0.30 (L) 06/08/2022 0454   CREATININE 0.77 08/31/2013 0436   CALCIUM 8.3 (L) 06/08/2022 0454   CALCIUM 8.8 08/31/2013 0436   GFRNONAA NOT CALCULATED 06/08/2022 0454   GFRNONAA >60 08/31/2013 0436   GFRAA >60 08/31/2013 0436   CrCl cannot be calculated (This lab value cannot be used to calculate CrCl because it is not a number: <0.30).  COAG Lab Results  Component  Value Date   INR 1.1 06/04/2022   INR 1.1 05/16/2022    Radiology DG Chest Port 1 View  Result Date: 06/06/2022 CLINICAL DATA:  61 year old female with hypoxia.  History of cancer. EXAM: PORTABLE CHEST 1 VIEW COMPARISON:  Portable chest 06/03/2022 and earlier. FINDINGS: Portable AP upright view at 0424 hours. Stable right chest power port. Mildly lower lung volumes. Streaky and veiling new right lung base opacity. No  pneumothorax. No pulmonary edema suspected. Left lung appears stable and negative. Percutaneous gastrostomy tube visible with negative upper abdominal bowel gas pattern. No acute osseous abnormality identified. IMPRESSION: New right lung base opacity which appears to be a combination of airspace disease and small new pleural effusion. Consider aspiration and/or pneumonia. Electronically Signed   By: Genevie Ann M.D.   On: 06/06/2022 04:36   CT Soft Tissue Neck W Contrast  Result Date: 06/04/2022 CLINICAL DATA:  Hematologic malignancy EXAM: CT NECK WITH CONTRAST TECHNIQUE: Multidetector CT imaging of the neck was performed using the standard protocol following the bolus administration of intravenous contrast. RADIATION DOSE REDUCTION: This exam was performed according to the departmental dose-optimization program which includes automated exposure control, adjustment of the mA and/or kV according to patient size and/or use of iterative reconstruction technique. CONTRAST:  12m OMNIPAQUE IOHEXOL 350 MG/ML SOLN COMPARISON:  05/15/2022 FINDINGS: PHARYNX AND LARYNX: Rightward deviation of the pharynx and upper larynx is unchanged. There is persistent left asymmetric soft tissue thickening at the larynx. The airway is maintained. Post treatment thickening of the epiglottis. Unchanged small retropharyngeal effusion. SALIVARY GLANDS: Post treatment hyperenhancement of the salivary glands is unchanged. THYROID: Normal. LYMPH NODES: Conglomerate nodal mass at left level 2A with areas of central necrosis is  unchanged allowing for differences in scan quality and contrast bolus timing. 5 mm right level 1A node is also unchanged. No new lymphadenopathy. VASCULAR: There is a right chest wall Port-A-Cath the terminates below the field of view. The left internal jugular vein remains occluded. There is narrowing of the distal left ICA due to mass effect by the surrounding soft tissues, unchanged. LIMITED INTRACRANIAL: Normal. VISUALIZED ORBITS: Normal. MASTOIDS AND VISUALIZED PARANASAL SINUSES: No fluid levels or advanced mucosal thickening. No mastoid effusion. SKELETON: No bony spinal canal stenosis. No lytic or blastic lesions. UPPER CHEST: Clear. OTHER: Unchanged cutaneous lesion of the left retroauricular region. IMPRESSION: 1. Unchanged appearance of confluent nodal mass at left level 2A with areas of central necrosis. 2. Unchanged 5 mm right level 1A node. 3. Unchanged cutaneous lesion of the left retroauricular region. 4. 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 Electronically Signed   By: KUlyses JarredM.D.   On: 06/04/2022 00:18   CT HEAD WO CONTRAST (5MM)  Result Date: 06/03/2022 CLINICAL DATA:  Altered mental status, head and neck carcinoma EXAM: CT HEAD WITHOUT CONTRAST TECHNIQUE: Contiguous axial images were obtained from the base of the skull through the vertex without intravenous contrast. RADIATION DOSE REDUCTION: This exam was performed according to the departmental dose-optimization program which includes automated exposure control, adjustment of the mA and/or kV according to patient size and/or use of iterative reconstruction technique. COMPARISON:  MRI 05/16/2022 FINDINGS: Brain: Normal anatomic configuration. Parenchymal volume loss is commensurate with the patient's age. Mild periventricular white matter changes are present likely reflecting the sequela of small vessel ischemia. No abnormal intra or extra-axial mass lesion or fluid collection. No abnormal mass effect or midline shift.  No evidence of acute intracranial hemorrhage or infarct. Ventricular size is normal. Cerebellum unremarkable. Vascular: No asymmetric hyperdense vasculature at the skull base. Skull: Intact Sinuses/Orbits: Paranasal sinuses are clear. Orbits are unremarkable. Other: Mastoid air cells and middle ear cavities are clear. Left retroauricular soft tissue mass is partially visualized at the inferior margin of the examination, better assessed on CT examination of the neck on 05/15/2022. IMPRESSION: 1. No acute intracranial abnormality. 2. Left retroauricular soft tissue mass partially visualized, better assessed on CT examination  of the neck on 05/15/2022. Electronically Signed   By: Fidela Salisbury M.D.   On: 06/03/2022 22:12   DG Chest Port 1 View  Result Date: 06/03/2022 CLINICAL DATA:  Questionable sepsis EXAM: PORTABLE CHEST 1 VIEW COMPARISON:  05/15/2022 FINDINGS: The heart size and mediastinal contours are within normal limits. Right chest port catheter. Both lungs are clear. The visualized skeletal structures are unremarkable. IMPRESSION: No acute abnormality of the lungs in AP portable projection. Electronically Signed   By: Delanna Ahmadi M.D.   On: 06/03/2022 21:20   MR Brain W and Wo Contrast  Result Date: 05/16/2022 CLINICAL DATA:  Seizures and balance loss. History of head neck carcinoma. EXAM: MRI HEAD WITHOUT AND WITH CONTRAST TECHNIQUE: Multiplanar, multiecho pulse sequences of the brain and surrounding structures were obtained without and with intravenous contrast. CONTRAST:  56m GADAVIST GADOBUTROL 1 MMOL/ML IV SOLN COMPARISON:  None Available. FINDINGS: Brain: No acute infarct, mass effect or extra-axial collection. No acute or chronic hemorrhage. Normal white matter signal, parenchymal volume and CSF spaces. The midline structures are normal. Vascular: Major flow voids are preserved. Skull and upper cervical spine: Findings within the soft tissues of the left neck are more completely described on  the concomitant CT of the neck. Briefly, there is confluent lymphadenopathy with central necrosis and a postauricular skin lesion. Normal calvarium. Sinuses/Orbits:No paranasal sinus fluid levels or advanced mucosal thickening. No mastoid or middle ear effusion. Normal orbits. IMPRESSION: 1. Normal brain.  No intracranial metastatic disease. 2. Findings of disease spread within the soft tissues of the left neck are more completely described on the concomitant CT of the neck. Electronically Signed   By: KUlyses JarredM.D.   On: 05/16/2022 01:04   CT Soft Tissue Neck W Contrast  Result Date: 05/15/2022 CLINICAL DATA:  Soft tissue swelling. History of head neck carcinoma. EXAM: CT NECK WITH CONTRAST TECHNIQUE: Multidetector CT imaging of the neck was performed using the standard protocol following the bolus administration of intravenous contrast. RADIATION DOSE REDUCTION: This exam was performed according to the departmental dose-optimization program which includes automated exposure control, adjustment of the mA and/or kV according to patient size and/or use of iterative reconstruction technique. CONTRAST:  722mOMNIPAQUE IOHEXOL 300 MG/ML  SOLN COMPARISON:  None Available. FINDINGS: PHARYNX AND LARYNX: Diffuse edema of the soft tissues of the oropharynx, hypopharynx and larynx, including the epiglottis. There is a small retropharyngeal effusion but no retropharyngeal or peritonsillar abscess. SALIVARY GLANDS: Increased salivary gland enhancement is likely a post treatment effect. THYROID: Normal. LYMPH NODES: Left level 2A nodal conglomerate with area of central necrosis that measures approximately 1.4 cm. There is an enhancing 5 mm right level 1A node. VASCULAR: The left internal jugular vein is thrombosed. There is abnormal contrast enhancement surrounding its course in the left neck, just distal to the carotid bifurcation. The other major vessels are patent. There is a right chest wall Port-A-Cath with its tip  below the field of view. LIMITED INTRACRANIAL: Normal. VISUALIZED ORBITS: Normal. MASTOIDS AND VISUALIZED PARANASAL SINUSES: No fluid levels or advanced mucosal thickening. No mastoid effusion. SKELETON: No bony spinal canal stenosis. No lytic or blastic lesions. UPPER CHEST: Clear. OTHER: There is a left retro auricular skin lesion measuring 2.5 x 1.3 cm. IMPRESSION: 1. Area of abnormal contrast enhancement at left level 2A, likely nodal conglomerate with areas of necrosis. There is likely extranodal extension with a cutaneous lesion of the retroauricular soft tissue. 2. Diffuse edema of the soft tissues of the  oropharynx, hypopharynx and larynx, likely sequela of radiation therapy. 3. 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 Electronically Signed   By: Ulyses Jarred M.D.   On: 05/15/2022 23:03   DG Chest 2 View  Result Date: 05/15/2022 CLINICAL DATA:  Weakness EXAM: CHEST - 2 VIEW COMPARISON:  Chest radiograph 08/31/2013 FINDINGS: A right chest wall port is in place with tip terminating in the right atrium. The cardiomediastinal silhouette is normal. There is no focal consolidation or pulmonary edema. There is no pleural effusion or pneumothorax There is no acute osseous abnormality. IMPRESSION: No radiographic evidence of acute cardiopulmonary process. Electronically Signed   By: Valetta Mole M.D.   On: 05/15/2022 18:19   CT Head Wo Contrast  Result Date: 05/15/2022 CLINICAL DATA:  Slurred speech facial droop multiple falls EXAM: CT HEAD WITHOUT CONTRAST CT CERVICAL SPINE WITHOUT CONTRAST TECHNIQUE: Multidetector CT imaging of the head and cervical spine was performed following the standard protocol without intravenous contrast. Multiplanar CT image reconstructions of the cervical spine were also generated. RADIATION DOSE REDUCTION: This exam was performed according to the departmental dose-optimization program which includes automated exposure control, adjustment of the mA and/or kV according to  patient size and/or use of iterative reconstruction technique. COMPARISON:  None Available. FINDINGS: CT HEAD FINDINGS Brain: No evidence of acute infarction, hemorrhage, cerebral edema, mass, mass effect, or midline shift. No hydrocephalus or extra-axial fluid collection. Vascular: No hyperdense vessel. Skull: Normal. Negative for fracture or focal lesion. Sinuses/Orbits: Mild mucosal thickening in the maxillary sinuses. The orbits are unremarkable. Other: The mastoid air cells are well aerated. CT CERVICAL SPINE FINDINGS Alignment: Reversal of the normal cervical lordosis. No traumatic listhesis. Skull base and vertebrae: No acute fracture or suspicious osseous lesion. Soft tissues and spinal canal: No prevertebral fluid or swelling. No visible canal hematoma. Disc levels: No high-grade spinal canal stenosis or neural foraminal narrowing. Upper chest: No focal pulmonary opacity or pleural effusion. Other: Evaluation of the neck soft tissues is limited by the absence of intravenous contrast. Within this limitation, abnormal soft tissue in the left parapharyngeal space, in the region of left level 2A lymph nodes, extending into the left paravertebral musculature, and an exophytic lesion posterior to the left ear are favored to be related to the patient's known throat cancer. IMPRESSION: 1.  No acute intracranial process. 2.  No acute fracture or traumatic listhesis in the cervical spine. 3. Evaluation of the neck soft tissues is limited by the absence of intravenous contrast. Within this limitation, there are changes in the left neck that are likely related to the patient's throat cancer. Electronically Signed   By: Merilyn Baba M.D.   On: 05/15/2022 18:13   CT Cervical Spine Wo Contrast  Result Date: 05/15/2022 CLINICAL DATA:  Slurred speech facial droop multiple falls EXAM: CT HEAD WITHOUT CONTRAST CT CERVICAL SPINE WITHOUT CONTRAST TECHNIQUE: Multidetector CT imaging of the head and cervical spine was  performed following the standard protocol without intravenous contrast. Multiplanar CT image reconstructions of the cervical spine were also generated. RADIATION DOSE REDUCTION: This exam was performed according to the departmental dose-optimization program which includes automated exposure control, adjustment of the mA and/or kV according to patient size and/or use of iterative reconstruction technique. COMPARISON:  None Available. FINDINGS: CT HEAD FINDINGS Brain: No evidence of acute infarction, hemorrhage, cerebral edema, mass, mass effect, or midline shift. No hydrocephalus or extra-axial fluid collection. Vascular: No hyperdense vessel. Skull: Normal. Negative for fracture or focal lesion. Sinuses/Orbits: Mild mucosal  thickening in the maxillary sinuses. The orbits are unremarkable. Other: The mastoid air cells are well aerated. CT CERVICAL SPINE FINDINGS Alignment: Reversal of the normal cervical lordosis. No traumatic listhesis. Skull base and vertebrae: No acute fracture or suspicious osseous lesion. Soft tissues and spinal canal: No prevertebral fluid or swelling. No visible canal hematoma. Disc levels: No high-grade spinal canal stenosis or neural foraminal narrowing. Upper chest: No focal pulmonary opacity or pleural effusion. Other: Evaluation of the neck soft tissues is limited by the absence of intravenous contrast. Within this limitation, abnormal soft tissue in the left parapharyngeal space, in the region of left level 2A lymph nodes, extending into the left paravertebral musculature, and an exophytic lesion posterior to the left ear are favored to be related to the patient's known throat cancer. IMPRESSION: 1.  No acute intracranial process. 2.  No acute fracture or traumatic listhesis in the cervical spine. 3. Evaluation of the neck soft tissues is limited by the absence of intravenous contrast. Within this limitation, there are changes in the left neck that are likely related to the patient's  throat cancer. Electronically Signed   By: Merilyn Baba M.D.   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  In order to attempt to prevent catastrophic hemorrhage, we can perform external carotid artery embolization with covered stent placement in the common internal carotid arteries.  This would significantly reduce the risk of bleeding.  However with this it would pose significant risk of thrombosis given the tumor encasement.  That would possibly cause a stroke going forward.  The procedure itself there is also risk for stroke.  However without intervention the patient has a possible life-threatening complication of hemorrhage. I contacted the patient's son Christina Porter to what date him including discussing the procedure as well as risk and benefits.  We discussed this as an option if the patient wished to continue to be aggressive with her treatment.  At Ryan's request I also contacted the patient's other son who is an Therapist, sports in [place].  I have also discussed the risk benefits and alternatives with him as well.  To have time to discuss this evening.  Will contact him in the morning determine if he wishes to move forward.  If so, we will plan on intervention tomorrow.  We will have the patient's tube feed boluses held in preparation.   Family Communication: Called and discussed case with son, Christina Porter, and at his request contacted son Christina Porter to discuss case procedure as well.  Thank you for allowing [place] to participate in the care of this patient.   Kris Hartmann, NP [facility] [phone] (Office Phone) [phone] (Office Fax) [phone] (Pager)  06/08/2022 11:36 PM  Staff may message me via secure chat in [place]  but this may not receive immediate response,  please page for urgent  matters!  Dictation software was used to generate the above note. Typos may occur and escape review, as with typed/written notes. Any error is purely unintentional.  Please contact me directly for clarity if needed.

## 2022-06-08 NOTE — Progress Notes (Signed)
Received consult for IV. Noted pt has a documented right chest port with xray completed on 06/06/22. Secure chat sent to RN recommending use of port.

## 2022-06-08 NOTE — Progress Notes (Addendum)
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  She presented in the ED in [name] where she presented with chest congestion, shortness of breath and hemoptysis.  CTA chest was negative for PE and admission was advised but patient left AMA. She presented in the [name] ED 05/16/2022 with c/o: disorientation, hypertension.   Extensive cancer SCC neck w/ mets - CT head 07/18 left retroauricular soft tissue mass better assessed on CT soft tissue neck. Patient was admitted for sepsis secondary to possible UTI vs neck mass, started on IV fluids and IV antibiotics. Infectious diseases consulted. Continuing IV antibiotics with Zosyn. Palliative care consulted to discuss goals of care: Patient wanting full code, full scope of treatment per their discussion 08/09. ENT also spoke w/ patient and son 08/09: If she remains here at [facility], and her lungs improve, and she and son wish to proceed, consider awake tracheostomy possibly next week - however high risk of bleeding / tumor extension complicating procedures that can be offered and high risk of pain/bleeding - concern patient and family are not comprehending severity of her situation. See ENT and palliative notes 08/09.  Since patient follows up with hem-onc at [name], multiple attempts have been made to transfer the patient to [facility] but no bed available.  ENT, pulmonology, oncology consulted. All recommended  transfer to [name] - hospitalist spoke to oncology team Dr Harriette Ohara at [facility] 06/06/2022 and reported they would accept patient when bed available.   Called [name] again 06/07/2022, they are at capacity and cannot accept transfer for medicine patients.  Spoke with palliative today 8/10, oncology palliative team will be reaching out to [name] oncology to see if they can facilitate transfer   Consultants:  ID ENT Oncology Pulmonology Palliative  Procedures: none    Subjective: Patient verbal communication limited d/t neck/throat mass but is improved compared to yesterday.  She reports pain overall is controlled but concerned about nasal drainage, cough.  No shortness of breath.    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  Lactic acid 0.8, UA : not significantly abnormal. Continue IV fluids as per sepsis protocol. 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 Harriette Ohara at [name].  S/p  chemo ( Cisplatin). Recently contacted by radiation oncology Dr. Audelia Acton they are trying to reschedule steroid injection and simulation appointments,  she has missed prior appointments. Patient at high risk for erosion into vessels, Lemierre's syndrome, tumor extension into sigmoid sinus and cavernous sinus. Blood-tinged mucus is coming from her cancer.  Previous hospitalist spoke 06/06/2022 with oncologist Dr. Harriette Ohara at [name], he agreed with the transfer but there is no bed available at this point, still no beds as of 06/07/2022 ENT and pulmonology and oncology here have evaluated the patient and have recommended transfer to [facility] for further care. Patient is not having any active bleeding, however she has extensive disease in the neck and she is at risk for acute arterial bleeding from tumor invasion and tumor necrosis from treatment. See notes 08/09. Palliative consult - full code. ENT consult - family will consider options, possible tracheostomy next week.  Confirmed with patient today, 08/10, that she wants to pursue tracheostomy. I discussed case with palliative today 06/08/2022, they will be reaching out to oncology team at [name] to see if they can facilitate transfer   Possible seizure disorder : Continue gabapentin.   Cancer related pain/chronic pain: Continue oxycodone as needed   Hyponatremia: Resolved with IV hydration.   Tobacco abuse: Continue nicotine patch.   UTI - RULED OUT UA prn symptoms    Underweight: BMI 16.94  severe protein calorie malnutrition Nutritionist evaluation    DVT prophylaxis: SCD Code Status: FULL Family Communication: Will call son later today and update this note if I am able to reach him. Disposition Plan / TOC needs: remains inpatient, unable to transfer to [name] at this time  Barriers to discharge / significant pending items: bed availability at [facility] / possible tracheostomy w/ ENT next week              Objective: Vitals:   06/08/22 0340  06/08/22 0459 06/08/22 0713 06/08/22 1113  BP: (!) 143/89  115/72 134/86  Pulse: (!) 108  (!) 106 100  Resp: '18  17 18  '$ Temp: 98.5 F (36.9 C)  97.7 F (36.5 C) 97.6 F (36.4 C)  TempSrc:   Axillary Oral  SpO2: 96%  100% 92%  Weight:  58.4 kg    Height:        Intake/Output Summary (Last 24 hours) at 06/08/2022 1440 Last data filed at 06/08/2022 0913 Gross per 24 hour  Intake 2456.56 ml  Output --  Net 2456.56 ml   Filed Weights   06/04/22 0201 06/06/22 0450 06/08/22 0459  Weight: 80 kg 58.5 kg 58.4 kg    Examination:  Constitutional:  VS as above General Appearance: alert, frail, NAD Ears, Nose, Mouth, Throat, neck: Bandage on L neck Swelling in L face, tongue, neck  Respiratory: Fair respiratory effort No wheeze No rhonchi No rales Cardiovascular: S1/S2 normal No lower extremity edema Gastrointestinal: No tenderness No masses Musculoskeletal:  No clubbing/cyanosis of digits Symmetrical movement in all extremities Neurological: Alert Psychiatric: Normal judgment/insight Flat mood and affect       Scheduled Medications:   feeding supplement (OSMOLITE 1.5 CAL)  237 mL Per Tube QID   feeding supplement (PROSource TF)  45 mL Per Tube BID   gabapentin  600 mg Per Tube TID   heparin flush  10 Units Intracatheter Once   morphine  10 mg Per Tube Q6H   multivitamin  15 mL Per Tube Daily   QUEtiapine  100 mg Per Tube QHS   traZODone  100 mg Per Tube QHS    Continuous Infusions:  ampicillin-sulbactam (UNASYN) IV 3 g (06/08/22 1144)   fluconazole (DIFLUCAN) IV Stopped (06/07/22 2123)   lactated ringers 100 mL/hr at 06/08/22 0913    PRN Medications:  acetaminophen (TYLENOL) oral liquid 160 mg/5 mL, acetaminophen **OR** acetaminophen, ondansetron **OR** ondansetron (ZOFRAN) IV, oxyCODONE, sodium chloride  Antimicrobials:  Anti-infectives (From admission, onward)    Start     Dose/Rate Route Frequency Ordered Stop   06/08/22 0600  Ampicillin-Sulbactam  (UNASYN) 3 g in sodium chloride 0.9 % 100 mL IVPB        3 g 200 mL/hr over 30 Minutes Intravenous Every 6 hours 06/07/22 1547     06/06/22 2200  fluconazole (DIFLUCAN) IVPB 200 mg        200 mg 100 mL/hr over 60 Minutes Intravenous Every 24 hours 06/05/22 1622     06/05/22 2200  piperacillin-tazobactam (ZOSYN) IVPB 3.375 g        3.375 g 12.5 mL/hr over 240 Minutes Intravenous Every 8 hours 06/05/22 1622 06/07/22 2359   06/05/22 1800  fluconazole (DIFLUCAN) IVPB 400 mg        400 mg 100 mL/hr over 120 Minutes Intravenous  Once 06/05/22 1622 06/07/22 0207   06/05/22 1545  Ampicillin-Sulbactam (UNASYN) 3 g in sodium chloride 0.9 % 100 mL IVPB  Status:  Discontinued        3 g 200 mL/hr over 30 Minutes Intravenous Every 6 hours 06/05/22 1450 06/05/22 1622   06/04/22 0115  cefTRIAXone (ROCEPHIN) 2 g in sodium chloride 0.9 % 100 mL IVPB  Status:  Discontinued        2 g 200 mL/hr over 30 Minutes Intravenous Every 24 hours 06/04/22 0102 06/05/22 1449   06/04/22 0045  cefTRIAXone (ROCEPHIN) 1 g in sodium chloride 0.9 % 100 mL IVPB  Status:  Discontinued        1 g 200 mL/hr over 30 Minutes Intravenous  Once 06/04/22 0030 06/04/22 0109       Data Reviewed: I have personally reviewed following labs and imaging studies  CBC: Recent Labs  Lab 06/04/22 0020 06/04/22 0852 06/05/22 0510 06/05/22 0831 06/06/22 0449 06/06/22 1715 06/08/22 0454  WBC 20.3* 19.7* 24.6*  --  18.8*  --  15.9*  NEUTROABS 17.5*  --   --   --   --   --   --   HGB 9.8* 11.9* 7.9* 8.0* 7.7* 9.2* 9.5*  HCT 29.9* 35.8* 24.7* 24.7* 24.7* 27.9* 29.0*  MCV 96.5 96.0 98.4  --  100.4*  --  93.5  PLT 518* 471* 419*  --  367  --  678   Basic Metabolic Panel: Recent Labs  Lab 06/03/22 2125 06/04/22 0852 06/05/22 0510 06/05/22 0510 06/05/22 1815 06/05/22 2154 06/06/22 0449 06/06/22 1715 06/08/22 0454  NA 133* 135 139  --   --   --  135  --  129*  K 3.2* 2.9* 4.5  --   --   --  5.0  --  3.6  CL 95* 95* 100  --    --   --  100  --  92*  CO2 '26 27 30  '$ --   --   --  29  --  30  GLUCOSE 111* 113*  112*  --   --   --  131*  --  131*  BUN 6* <5* 5*  --   --   --  9  --  7*  CREATININE 0.51 0.53 0.54  --   --   --  0.57  --  <0.30*  CALCIUM 8.6* 9.3 8.6*  --   --   --  8.5*  --  8.3*  MG  --   --  1.2*   < > 1.9 2.0 1.6* 1.6* 1.4*  PHOS  --   --  3.0   < > 1.8* 1.7* 2.0* 5.3* 2.5   < > = values in this interval not displayed.   GFR: CrCl cannot be calculated (This lab value cannot be used to calculate CrCl because it is not a number: <0.30). Liver Function Tests: Recent Labs  Lab 06/03/22 2125 06/06/22 0449  AST 18 15  ALT 9 9  ALKPHOS 106 92  BILITOT 0.5 0.3  PROT 6.1* 5.6*  ALBUMIN 2.5* 2.1*   No results for input(s): "LIPASE", "AMYLASE" in the last 168 hours. No results for input(s): "AMMONIA" in the last 168 hours. Coagulation Profile: Recent Labs  Lab 06/04/22 0020  INR 1.1   Cardiac Enzymes: No results for input(s): "CKTOTAL", "CKMB", "CKMBINDEX", "TROPONINI" in the last 168 hours. BNP (last 3 results) No results for input(s): "PROBNP" in the last 8760 hours. HbA1C: No results for input(s): "HGBA1C" in the last 72 hours. CBG: Recent Labs  Lab 06/07/22 1941 06/07/22 2347 06/08/22 0354 06/08/22 0709 06/08/22 1145  GLUCAP 150* 149* 149* 122* 148*   Lipid Profile: No results for input(s): "CHOL", "HDL", "LDLCALC", "TRIG", "CHOLHDL", "LDLDIRECT" in the last 72 hours. Thyroid Function Tests: No results for input(s): "TSH", "T4TOTAL", "FREET4", "T3FREE", "THYROIDAB" in the last 72 hours. Anemia Panel: No results for input(s): "VITAMINB12", "FOLATE", "FERRITIN", "TIBC", "IRON", "RETICCTPCT" in the last 72 hours. Urine analysis:    Component Value Date/Time   COLORURINE YELLOW (A) 06/04/2022 0020   APPEARANCEUR CLEAR (A) 06/04/2022 0020   APPEARANCEUR Clear 08/31/2013 0312   LABSPEC 1.023 06/04/2022 0020   LABSPEC 1.003 08/31/2013 0312   PHURINE 8.0 06/04/2022 0020    GLUCOSEU NEGATIVE 06/04/2022 0020   GLUCOSEU Negative 08/31/2013 0312   HGBUR NEGATIVE 06/04/2022 0020   BILIRUBINUR NEGATIVE 06/04/2022 0020   BILIRUBINUR Negative 08/31/2013 0312   KETONESUR NEGATIVE 06/04/2022 0020   PROTEINUR NEGATIVE 06/04/2022 0020   NITRITE NEGATIVE 06/04/2022 0020   LEUKOCYTESUR SMALL (A) 06/04/2022 0020   LEUKOCYTESUR Negative 08/31/2013 0312   Sepsis Labs: '@LABRCNTIP'$ (procalcitonin:4,lacticidven:4)  Recent Results (from the past 240 hour(s))  Blood Culture (routine x 2)     Status: None   Collection Time: 06/03/22  9:24 PM   Specimen: BLOOD  Result Value Ref Range Status   Specimen Description BLOOD BLOOD LEFT FOREARM  Final   Special Requests   Final    BOTTLES DRAWN AEROBIC AND ANAEROBIC Blood Culture adequate volume   Culture   Final    NO GROWTH 5 DAYS Performed at [facility], [address] [place], [place] [address]    Report Status 06/08/2022 FINAL  Final  Blood Culture (routine x 2)     Status: None   Collection Time: 06/03/22  9:26 PM   Specimen: BLOOD  Result Value Ref Range Status   Specimen Description BLOOD BLOOD RIGHT FOREARM  Final   Special Requests   Final    BOTTLES DRAWN AEROBIC AND ANAEROBIC Blood Culture results  may not be optimal due to an inadequate volume of blood received in culture bottles   Culture   Final    NO GROWTH 5 DAYS Performed at [facility], [address] [place], [place] [address]    Report Status 06/08/2022 FINAL  Final  Urine Culture     Status: None   Collection Time: 06/04/22 12:20 AM   Specimen: Urine, Random  Result Value Ref Range Status   Specimen Description   Final    URINE, RANDOM Performed at [facility], [address], [place], [place] [address]    Special Requests   Final    NONE Performed at [facility], [address], [place], [place] [address]    Culture   Final    NO GROWTH Performed at [facility], [place] [address],  [place], [place] [address]    Report Status 06/05/2022 FINAL  Final  SARS Coronavirus 2 by RT PCR (hospital order, performed in [facility] hospital lab) *cepheid single result test*     Status: None   Collection Time: 06/04/22 12:20 AM   Specimen: Nasal Swab  Result Value Ref Range Status   SARS Coronavirus 2 by RT PCR NEGATIVE NEGATIVE Final    Comment: (NOTE) SARS-CoV-2 target nucleic acids are NOT DETECTED.  The SARS-CoV-2 RNA is generally detectable in upper and lower respiratory specimens during the acute phase of infection. The lowest concentration of SARS-CoV-2 viral copies this assay can detect is 250 copies / mL. A negative result does not preclude SARS-CoV-2 infection and should not be used as the sole basis for treatment or other patient management decisions.  A negative result may occur with improper specimen collection / handling, submission of specimen other than nasopharyngeal swab, presence of viral mutation(s) within the areas targeted by this assay, and inadequate number of viral copies (<250 copies / mL). A negative result must be combined with clinical observations, patient history, and epidemiological information.  Fact Sheet for Patients:   https://www.patel.info/  Fact Sheet for Healthcare Providers: https://hall.com/  This test is not yet approved or  cleared by the [place] FDA and has been authorized for detection and/or diagnosis of SARS-CoV-2 by FDA under an Emergency Use Authorization (EUA).  This EUA will remain in effect (meaning this test can be used) for the duration of the COVID-19 declaration under Section 564(b)(1) of the Act, 21 U.S.C. section 360bbb-3(b)(1), unless the authorization is terminated or revoked sooner.  Performed at [facility], [address] [place], [place] [address]          Radiology Studies: CT Soft Tissue Neck W Contrast  Result Date: 06/04/2022 CLINICAL  DATA:  Hematologic malignancy EXAM: CT NECK WITH CONTRAST TECHNIQUE: Multidetector CT imaging of the neck was performed using the standard protocol following the bolus administration of intravenous contrast. RADIATION DOSE REDUCTION: This exam was performed according to the departmental dose-optimization program which includes automated exposure control, adjustment of the mA and/or kV according to patient size and/or use of iterative reconstruction technique. CONTRAST:  31m OMNIPAQUE IOHEXOL 350 MG/ML SOLN COMPARISON:  05/15/2022 FINDINGS: PHARYNX AND LARYNX: Rightward deviation of the pharynx and upper larynx is unchanged. There is persistent left asymmetric soft tissue thickening at the larynx. The airway is maintained. Post treatment thickening of the epiglottis. Unchanged small retropharyngeal effusion. SALIVARY GLANDS: Post treatment hyperenhancement of the salivary glands is unchanged. THYROID: Normal. LYMPH NODES: Conglomerate nodal mass at left level 2A with areas of central necrosis  is unchanged allowing for differences in scan quality and contrast bolus timing. 5 mm right level 1A node is also unchanged. No new lymphadenopathy. VASCULAR: There is a right chest wall Port-A-Cath the terminates below the field of view. The left internal jugular vein remains occluded. There is narrowing of the distal left ICA due to mass effect by the surrounding soft tissues, unchanged. LIMITED INTRACRANIAL: Normal. VISUALIZED ORBITS: Normal. MASTOIDS AND VISUALIZED PARANASAL SINUSES: No fluid levels or advanced mucosal thickening. No mastoid effusion. SKELETON: No bony spinal canal stenosis. No lytic or blastic lesions. UPPER CHEST: Clear. OTHER: Unchanged cutaneous lesion of the left retroauricular region. IMPRESSION: 1. Unchanged appearance of confluent nodal mass at left level 2A with areas of central necrosis. 2. Unchanged 5 mm right level 1A node. 3. Unchanged cutaneous lesion of the left retroauricular region. 4.  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 Electronically Signed   By: Ulyses Jarred M.D.   On: 06/04/2022 00:18   CT HEAD WO CONTRAST (5MM)  Result Date: 06/03/2022 CLINICAL DATA:  Altered mental status, head and neck carcinoma EXAM: CT HEAD WITHOUT CONTRAST TECHNIQUE: Contiguous axial images were obtained from the base of the skull through the vertex without intravenous contrast. RADIATION DOSE REDUCTION: This exam was performed according to the departmental dose-optimization program which includes automated exposure control, adjustment of the mA and/or kV according to patient size and/or use of iterative reconstruction technique. COMPARISON:  MRI 05/16/2022 FINDINGS: Brain: Normal anatomic configuration. Parenchymal volume loss is commensurate with the patient's age. Mild periventricular white matter changes are present likely reflecting the sequela of small vessel ischemia. No abnormal intra or extra-axial mass lesion or fluid collection. No abnormal mass effect or midline shift. No evidence of acute intracranial hemorrhage or infarct. Ventricular size is normal. Cerebellum unremarkable. Vascular: No asymmetric hyperdense vasculature at the skull base. Skull: Intact Sinuses/Orbits: Paranasal sinuses are clear. Orbits are unremarkable. Other: Mastoid air cells and middle ear cavities are clear. Left retroauricular soft tissue mass is partially visualized at the inferior margin of the examination, better assessed on CT examination of the neck on 05/15/2022. IMPRESSION: 1. No acute intracranial abnormality. 2. Left retroauricular soft tissue mass partially visualized, better assessed on CT examination of the neck on 05/15/2022. Electronically Signed   By: Fidela Salisbury M.D.   On: 06/03/2022 22:12   DG Chest Port 1 View  Result Date: 06/03/2022 CLINICAL DATA:  Questionable sepsis EXAM: PORTABLE CHEST 1 VIEW COMPARISON:  05/15/2022 FINDINGS: The heart size and mediastinal contours are within  normal limits. Right chest port catheter. Both lungs are clear. The visualized skeletal structures are unremarkable. IMPRESSION: No acute abnormality of the lungs in AP portable projection. Electronically Signed   By: Delanna Ahmadi M.D.   On: 06/03/2022 21:20            LOS: 4 days        Emeterio Reeve, DO [facility] 06/08/2022, 2:40 PM   Staff may message me via secure chat in [place]  but this may not receive immediate response,  please page for urgent matters!  If 7PM-7AM, please contact night-coverage [email or web address]  Dictation software was used to generate the above note. Typos may occur and escape review, as with typed/written notes. Please contact Dr Sheppard Coil directly for clarity if needed.

## 2022-06-08 NOTE — Progress Notes (Signed)
Hematology/Oncology Progress note [phone] [phone]     Patient Care Team: Neomia Dear, MD as PCP - General (Family Medicine)   Name of the patient: Christina Porter  [id number]  [birth year]  Date of visit: 06/08/22   INTERVAL HISTORY-   Hemoglobin improved after PRBC transfusion.  No current active bleeding. + Neck pain    Allergies  Allergen Reactions   Benadryl [Diphenhydramine] Other (See Comments)    Somnolence when given 25 mg IV; consider low dose administration    Patient Active Problem List   Diagnosis Date Noted   Pressure injury of skin 06/07/2022   Head and neck cancer (HCC)    Hyponatremia 06/04/2022   SIRS (systemic inflammatory response syndrome) (HCC) 06/04/2022   Abnormal urinalysis 06/04/2022   Cancer associated pain 06/04/2022   Underweight 06/04/2022   Possible Seizure disorder ([place]) 05/16/2022   Bipolar 1 disorder ([place]) 05/16/2022   Tobacco abuse 05/16/2022   Throat cancer ([place]) 05/16/2022   Hypokalemia 05/16/2022   Protein-calorie malnutrition, severe 05/16/2022   Internal jugular vein thrombosis, 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     Past Medical History:  Diagnosis Date   Bipolar 1 disorder ([place])    Seizures ([place])      Past Surgical History:  Procedure Laterality Date   CESAREAN SECTION      Social History   Socioeconomic History   Marital status: Married    Spouse name: Not on file   Number of children: Not on file   Years of education: Not on file   Highest education level: Not on file  Occupational History   Not on file  Tobacco Use   Smoking status: Every Day    Types: Cigarettes   Smokeless tobacco: Never  Vaping Use   Vaping Use: Never used  Substance and Sexual Activity   Alcohol use: Yes   Drug use: Never   Sexual activity: Not on file  Other Topics Concern   Not on file  Social History Narrative   ** Merged History Encounter **       Social Determinants of Health   Financial  Resource Strain: Not on file  Food Insecurity: Not on file  Transportation Needs: Not on file  Physical Activity: Not on file  Stress: Not on file  Social Connections: Not on file  Intimate Partner Violence: Not on file     Family History  Family history unknown: Yes     Current Facility-Administered Medications:    acetaminophen (TYLENOL) 160 MG/5ML solution 650 mg, 650 mg, Per Tube, Q6H PRN, Athena Masse, MD   acetaminophen (TYLENOL) tablet 650 mg, 650 mg, Oral, Q6H PRN, 650 mg at 06/07/22 1522 **OR** acetaminophen (TYLENOL) suppository 650 mg, 650 mg, Rectal, Q6H PRN, Athena Masse, MD   Ampicillin-Sulbactam (UNASYN) 3 g in sodium chloride 0.9 % 100 mL IVPB, 3 g, Intravenous, Q6H, Ravishankar, Jayashree, MD, Last Rate: 200 mL/hr at 06/08/22 1144, 3 g at 06/08/22 1144   feeding supplement (OSMOLITE 1.5 CAL) liquid 237 mL, 237 mL, Per Tube, QID, Shawna Clamp, MD, 237 mL at 06/08/22 1407   feeding supplement (PROSource TF) liquid 45 mL, 45 mL, Per Tube, BID, Shawna Clamp, MD, 45 mL at 06/08/22 0930   fluconazole (DIFLUCAN) IVPB 200 mg, 200 mg, Intravenous, Q24H, Ravishankar, Joellyn Quails, MD, Stopped at 06/07/22 2123   gabapentin (NEURONTIN) 250 MG/5ML solution 600 mg, 600 mg, Per Tube, TID, Athena Masse, MD, 600 mg at 06/08/22 0930   heparin flush 10  UNIT/ML injection 10 Units, 10 Units, Intracatheter, Once, Versie Starks, PA-C   lactated ringers infusion, , Intravenous, Continuous, Athena Masse, MD, Last Rate: 100 mL/hr at 06/08/22 0913, Infusion Verify at 06/08/22 0913   morphine 10 MG/5ML solution 10 mg, 10 mg, Per Tube, Q6H, Athena Masse, MD, 10 mg at 06/08/22 1412   multivitamin liquid 15 mL, 15 mL, Per Tube, Daily, Hallaji, Sheema M, RPH, 15 mL at 06/08/22 0930   ondansetron (ZOFRAN) tablet 4 mg, 4 mg, Oral, Q6H PRN **OR** ondansetron (ZOFRAN) injection 4 mg, 4 mg, Intravenous, Q6H PRN, Athena Masse, MD   oxyCODONE (ROXICODONE) 5 MG/5ML solution 5 mg, 5 mg, Per  Tube, Q4H PRN, Athena Masse, MD, 5 mg at 06/07/22 1602   QUEtiapine (SEROQUEL) tablet 100 mg, 100 mg, Per Tube, QHS, Athena Masse, MD, 100 mg at 06/07/22 2016   sodium chloride (OCEAN) 0.65 % nasal spray 1 spray, 1 spray, Each Nare, PRN, Emeterio Reeve, DO, 1 spray at 06/07/22 1233   traZODone (DESYREL) tablet 100 mg, 100 mg, Per Tube, QHS, Athena Masse, MD, 100 mg at 06/07/22 2016   Physical exam:  Vitals:   06/08/22 0340 06/08/22 0459 06/08/22 0713 06/08/22 1113  BP: (!) 143/89  115/72 134/86  Pulse: (!) 108  (!) 106 100  Resp: '18  17 18  '$ Temp: 98.5 F (36.9 C)  97.7 F (36.5 C) 97.6 F (36.4 C)  TempSrc:   Axillary Oral  SpO2: 96%  100% 92%  Weight:  128 lb 13.7 oz (58.4 kg)    Height:       Physical Exam  Constitutional:      Appearance: She is ill-appearing, cachectic Neck:     Comments: Left retroauricular cutaneous wound/drainage, covered with dressing.  Cardiovascular:     Rate and Rhythm: Tachycardia present.  Pulmonary:     Effort: Pulmonary effort is normal. No respiratory distress.     Breath sounds: Rhonchi present.  Abdominal:     General: Abdomen is flat.  Musculoskeletal:        General: No swelling.  Lymphadenopathy:     Cervical: Cervical adenopathy present.  Skin:    General: Skin is warm.  Neurological:     Mental Status: She is alert. Mental status is at baseline.     Comments: Left facial droop       Latest Ref Rng & Units 06/08/2022    4:54 AM  CMP  Glucose 70 - 99 mg/dL 131   BUN 8 - 23 mg/dL 7   Creatinine 0.44 - 1.00 mg/dL <0.30   Sodium 135 - 145 mmol/L 129   Potassium 3.5 - 5.1 mmol/L 3.6   Chloride 98 - 111 mmol/L 92   CO2 22 - 32 mmol/L 30   Calcium 8.9 - 10.3 mg/dL 8.3       Latest Ref Rng & Units 06/08/2022    4:54 AM  CBC  WBC 4.0 - 10.5 K/uL 15.9   Hemoglobin 12.0 - 15.0 g/dL 9.5   Hematocrit 36.0 - 46.0 % 29.0   Platelets 150 - 400 K/uL 294     RADIOGRAPHIC STUDIES: I have personally reviewed the  radiological images as listed and agreed with the findings in the report. DG Chest Port 1 View  Result Date: 06/06/2022 CLINICAL DATA:  61 year old female with hypoxia.  History of cancer. EXAM: PORTABLE CHEST 1 VIEW COMPARISON:  Portable chest 06/03/2022 and earlier. FINDINGS: Portable AP upright view at 0424 hours.  Stable right chest power port. Mildly lower lung volumes. Streaky and veiling new right lung base opacity. No pneumothorax. No pulmonary edema suspected. Left lung appears stable and negative. Percutaneous gastrostomy tube visible with negative upper abdominal bowel gas pattern. No acute osseous abnormality identified. IMPRESSION: New right lung base opacity which appears to be a combination of airspace disease and small new pleural effusion. Consider aspiration and/or pneumonia. Electronically Signed   By: Genevie Ann M.D.   On: 06/06/2022 04:36   CT Soft Tissue Neck W Contrast  Result Date: 06/04/2022 CLINICAL DATA:  Hematologic malignancy EXAM: CT NECK WITH CONTRAST TECHNIQUE: Multidetector CT imaging of the neck was performed using the standard protocol following the bolus administration of intravenous contrast. RADIATION DOSE REDUCTION: This exam was performed according to the departmental dose-optimization program which includes automated exposure control, adjustment of the mA and/or kV according to patient size and/or use of iterative reconstruction technique. CONTRAST:  67m OMNIPAQUE IOHEXOL 350 MG/ML SOLN COMPARISON:  05/15/2022 FINDINGS: PHARYNX AND LARYNX: Rightward deviation of the pharynx and upper larynx is unchanged. There is persistent left asymmetric soft tissue thickening at the larynx. The airway is maintained. Post treatment thickening of the epiglottis. Unchanged small retropharyngeal effusion. SALIVARY GLANDS: Post treatment hyperenhancement of the salivary glands is unchanged. THYROID: Normal. LYMPH NODES: Conglomerate nodal mass at left level 2A with areas of central necrosis  is unchanged allowing for differences in scan quality and contrast bolus timing. 5 mm right level 1A node is also unchanged. No new lymphadenopathy. VASCULAR: There is a right chest wall Port-A-Cath the terminates below the field of view. The left internal jugular vein remains occluded. There is narrowing of the distal left ICA due to mass effect by the surrounding soft tissues, unchanged. LIMITED INTRACRANIAL: Normal. VISUALIZED ORBITS: Normal. MASTOIDS AND VISUALIZED PARANASAL SINUSES: No fluid levels or advanced mucosal thickening. No mastoid effusion. SKELETON: No bony spinal canal stenosis. No lytic or blastic lesions. UPPER CHEST: Clear. OTHER: Unchanged cutaneous lesion of the left retroauricular region. IMPRESSION: 1. Unchanged appearance of confluent nodal mass at left level 2A with areas of central necrosis. 2. Unchanged 5 mm right level 1A node. 3. Unchanged cutaneous lesion of the left retroauricular region. 4. 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 Electronically Signed   By: KUlyses JarredM.D.   On: 06/04/2022 00:18   CT HEAD WO CONTRAST (5MM)  Result Date: 06/03/2022 CLINICAL DATA:  Altered mental status, head and neck carcinoma EXAM: CT HEAD WITHOUT CONTRAST TECHNIQUE: Contiguous axial images were obtained from the base of the skull through the vertex without intravenous contrast. RADIATION DOSE REDUCTION: This exam was performed according to the departmental dose-optimization program which includes automated exposure control, adjustment of the mA and/or kV according to patient size and/or use of iterative reconstruction technique. COMPARISON:  MRI 05/16/2022 FINDINGS: Brain: Normal anatomic configuration. Parenchymal volume loss is commensurate with the patient's age. Mild periventricular white matter changes are present likely reflecting the sequela of small vessel ischemia. No abnormal intra or extra-axial mass lesion or fluid collection. No abnormal mass effect or midline  shift. No evidence of acute intracranial hemorrhage or infarct. Ventricular size is normal. Cerebellum unremarkable. Vascular: No asymmetric hyperdense vasculature at the skull base. Skull: Intact Sinuses/Orbits: Paranasal sinuses are clear. Orbits are unremarkable. Other: Mastoid air cells and middle ear cavities are clear. Left retroauricular soft tissue mass is partially visualized at the inferior margin of the examination, better assessed on CT examination of the neck on 05/15/2022. IMPRESSION:  1. No acute intracranial abnormality. 2. Left retroauricular soft tissue mass partially visualized, better assessed on CT examination of the neck on 05/15/2022. Electronically Signed   By: Fidela Salisbury M.D.   On: 06/03/2022 22:12   DG Chest Port 1 View  Result Date: 06/03/2022 CLINICAL DATA:  Questionable sepsis EXAM: PORTABLE CHEST 1 VIEW COMPARISON:  05/15/2022 FINDINGS: The heart size and mediastinal contours are within normal limits. Right chest port catheter. Both lungs are clear. The visualized skeletal structures are unremarkable. IMPRESSION: No acute abnormality of the lungs in AP portable projection. Electronically Signed   By: Delanna Ahmadi M.D.   On: 06/03/2022 21:20   MR Brain W and Wo Contrast  Result Date: 05/16/2022 CLINICAL DATA:  Seizures and balance loss. History of head neck carcinoma. EXAM: MRI HEAD WITHOUT AND WITH CONTRAST TECHNIQUE: Multiplanar, multiecho pulse sequences of the brain and surrounding structures were obtained without and with intravenous contrast. CONTRAST:  15m GADAVIST GADOBUTROL 1 MMOL/ML IV SOLN COMPARISON:  None Available. FINDINGS: Brain: No acute infarct, mass effect or extra-axial collection. No acute or chronic hemorrhage. Normal white matter signal, parenchymal volume and CSF spaces. The midline structures are normal. Vascular: Major flow voids are preserved. Skull and upper cervical spine: Findings within the soft tissues of the left neck are more completely  described on the concomitant CT of the neck. Briefly, there is confluent lymphadenopathy with central necrosis and a postauricular skin lesion. Normal calvarium. Sinuses/Orbits:No paranasal sinus fluid levels or advanced mucosal thickening. No mastoid or middle ear effusion. Normal orbits. IMPRESSION: 1. Normal brain.  No intracranial metastatic disease. 2. Findings of disease spread within the soft tissues of the left neck are more completely described on the concomitant CT of the neck. Electronically Signed   By: KUlyses JarredM.D.   On: 05/16/2022 01:04   CT Soft Tissue Neck W Contrast  Result Date: 05/15/2022 CLINICAL DATA:  Soft tissue swelling. History of head neck carcinoma. EXAM: CT NECK WITH CONTRAST TECHNIQUE: Multidetector CT imaging of the neck was performed using the standard protocol following the bolus administration of intravenous contrast. RADIATION DOSE REDUCTION: This exam was performed according to the departmental dose-optimization program which includes automated exposure control, adjustment of the mA and/or kV according to patient size and/or use of iterative reconstruction technique. CONTRAST:  75mOMNIPAQUE IOHEXOL 300 MG/ML  SOLN COMPARISON:  None Available. FINDINGS: PHARYNX AND LARYNX: Diffuse edema of the soft tissues of the oropharynx, hypopharynx and larynx, including the epiglottis. There is a small retropharyngeal effusion but no retropharyngeal or peritonsillar abscess. SALIVARY GLANDS: Increased salivary gland enhancement is likely a post treatment effect. THYROID: Normal. LYMPH NODES: Left level 2A nodal conglomerate with area of central necrosis that measures approximately 1.4 cm. There is an enhancing 5 mm right level 1A node. VASCULAR: The left internal jugular vein is thrombosed. There is abnormal contrast enhancement surrounding its course in the left neck, just distal to the carotid bifurcation. The other major vessels are patent. There is a right chest wall Port-A-Cath  with its tip below the field of view. LIMITED INTRACRANIAL: Normal. VISUALIZED ORBITS: Normal. MASTOIDS AND VISUALIZED PARANASAL SINUSES: No fluid levels or advanced mucosal thickening. No mastoid effusion. SKELETON: No bony spinal canal stenosis. No lytic or blastic lesions. UPPER CHEST: Clear. OTHER: There is a left retro auricular skin lesion measuring 2.5 x 1.3 cm. IMPRESSION: 1. Area of abnormal contrast enhancement at left level 2A, likely nodal conglomerate with areas of necrosis. There is likely extranodal extension  with a cutaneous lesion of the retroauricular soft tissue. 2. Diffuse edema of the soft tissues of the oropharynx, hypopharynx and larynx, likely sequela of radiation therapy. 3. 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 Electronically Signed   By: Ulyses Jarred M.D.   On: 05/15/2022 23:03   DG Chest 2 View  Result Date: 05/15/2022 CLINICAL DATA:  Weakness EXAM: CHEST - 2 VIEW COMPARISON:  Chest radiograph 08/31/2013 FINDINGS: A right chest wall port is in place with tip terminating in the right atrium. The cardiomediastinal silhouette is normal. There is no focal consolidation or pulmonary edema. There is no pleural effusion or pneumothorax There is no acute osseous abnormality. IMPRESSION: No radiographic evidence of acute cardiopulmonary process. Electronically Signed   By: Valetta Mole M.D.   On: 05/15/2022 18:19   CT Head Wo Contrast  Result Date: 05/15/2022 CLINICAL DATA:  Slurred speech facial droop multiple falls EXAM: CT HEAD WITHOUT CONTRAST CT CERVICAL SPINE WITHOUT CONTRAST TECHNIQUE: Multidetector CT imaging of the head and cervical spine was performed following the standard protocol without intravenous contrast. Multiplanar CT image reconstructions of the cervical spine were also generated. RADIATION DOSE REDUCTION: This exam was performed according to the departmental dose-optimization program which includes automated exposure control, adjustment of the mA and/or kV  according to patient size and/or use of iterative reconstruction technique. COMPARISON:  None Available. FINDINGS: CT HEAD FINDINGS Brain: No evidence of acute infarction, hemorrhage, cerebral edema, mass, mass effect, or midline shift. No hydrocephalus or extra-axial fluid collection. Vascular: No hyperdense vessel. Skull: Normal. Negative for fracture or focal lesion. Sinuses/Orbits: Mild mucosal thickening in the maxillary sinuses. The orbits are unremarkable. Other: The mastoid air cells are well aerated. CT CERVICAL SPINE FINDINGS Alignment: Reversal of the normal cervical lordosis. No traumatic listhesis. Skull base and vertebrae: No acute fracture or suspicious osseous lesion. Soft tissues and spinal canal: No prevertebral fluid or swelling. No visible canal hematoma. Disc levels: No high-grade spinal canal stenosis or neural foraminal narrowing. Upper chest: No focal pulmonary opacity or pleural effusion. Other: Evaluation of the neck soft tissues is limited by the absence of intravenous contrast. Within this limitation, abnormal soft tissue in the left parapharyngeal space, in the region of left level 2A lymph nodes, extending into the left paravertebral musculature, and an exophytic lesion posterior to the left ear are favored to be related to the patient's known throat cancer. IMPRESSION: 1.  No acute intracranial process. 2.  No acute fracture or traumatic listhesis in the cervical spine. 3. Evaluation of the neck soft tissues is limited by the absence of intravenous contrast. Within this limitation, there are changes in the left neck that are likely related to the patient's throat cancer. Electronically Signed   By: Merilyn Baba M.D.   On: 05/15/2022 18:13   CT Cervical Spine Wo Contrast  Result Date: 05/15/2022 CLINICAL DATA:  Slurred speech facial droop multiple falls EXAM: CT HEAD WITHOUT CONTRAST CT CERVICAL SPINE WITHOUT CONTRAST TECHNIQUE: Multidetector CT imaging of the head and cervical  spine was performed following the standard protocol without intravenous contrast. Multiplanar CT image reconstructions of the cervical spine were also generated. RADIATION DOSE REDUCTION: This exam was performed according to the departmental dose-optimization program which includes automated exposure control, adjustment of the mA and/or kV according to patient size and/or use of iterative reconstruction technique. COMPARISON:  None Available. FINDINGS: CT HEAD FINDINGS Brain: No evidence of acute infarction, hemorrhage, cerebral edema, mass, mass effect, or midline shift. No hydrocephalus or  extra-axial fluid collection. Vascular: No hyperdense vessel. Skull: Normal. Negative for fracture or focal lesion. Sinuses/Orbits: Mild mucosal thickening in the maxillary sinuses. The orbits are unremarkable. Other: The mastoid air cells are well aerated. CT CERVICAL SPINE FINDINGS Alignment: Reversal of the normal cervical lordosis. No traumatic listhesis. Skull base and vertebrae: No acute fracture or suspicious osseous lesion. Soft tissues and spinal canal: No prevertebral fluid or swelling. No visible canal hematoma. Disc levels: No high-grade spinal canal stenosis or neural foraminal narrowing. Upper chest: No focal pulmonary opacity or pleural effusion. Other: Evaluation of the neck soft tissues is limited by the absence of intravenous contrast. Within this limitation, abnormal soft tissue in the left parapharyngeal space, in the region of left level 2A lymph nodes, extending into the left paravertebral musculature, and an exophytic lesion posterior to the left ear are favored to be related to the patient's known throat cancer. IMPRESSION: 1.  No acute intracranial process. 2.  No acute fracture or traumatic listhesis in the cervical spine. 3. Evaluation of the neck soft tissues is limited by the absence of intravenous contrast. Within this limitation, there are changes in the left neck that are likely related to the  patient's throat cancer. Electronically Signed   By: Merilyn Baba M.D.   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 Extensive local regional tumor burden with increased risk of severe artery bleeding,septic thrombophlebitis and cavernous sinus invasion.  Poor nutritional status.  Prognosis is very poor. Patient has poor insights of her condition.  Her case was discussed on tumor board and consensus recommendation is to transfer patient to [facility] when bed is available.  Consult vascular surgery for evaluation of embolization in case she bleeds. Continue supportive care, including antibiotics for aspiration pneumonia, necrotic cutaneous lesion, keep active type and screen every 72 hours, PRBC transfusion to keep hemoglobin above 8.  Speech swallow evaluation.  Palliative care service is on board for goals of care discussion. Patient's son [name] was called and updated.  He understands that patient is critically ill and prognosis extremely poor.  He remains hopeful that patient will get better at [facility].  Hospitalist team Dr. Sheppard Coil was updated via secure chat. Thank you for allowing me to participate in the care of this patient.   Earlie Server, MD, PhD Hematology Oncology 06/08/2022

## 2022-06-08 NOTE — Consult Note (Signed)
[facility] Consult Note  MRN : [id number]  Christina Porter is a 61 y.o. ([birth year]) female who presents with chief complaint of  Chief Complaint  Patient presents with   Vascular Access Problem  .   Consulting Physician: Emeterio Reeve, DO Reason for consult: Extensive neck cancer History of Present Illness: The patient is a 61 year old female that has a past medical history significant for bipolar 1 disorder, seizures and metastatic throat cancer.  She initially presented due to having worsening weakness and what was initially felt to be a seizure.  The patient has been undergoing treatment for head and neck cancer at [facility].  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  The patient is noted to have an extensive fungating ulcerative lesion.  The patient currently does not have active bleeding however there is a significant risk for acute arterial bleeding from tumor erosion as well as tumor necrosis from her treatment.    Current Facility-Administered Medications  Medication Dose Route Frequency Provider Last Rate Last Admin   acetaminophen (TYLENOL) 160 MG/5ML solution 650 mg  650 mg Per Tube Q6H PRN Athena Masse, MD   650 mg at 06/08/22 2030   acetaminophen (TYLENOL) tablet 650 mg  650 mg Oral Q6H PRN Athena Masse, MD   650 mg at 06/07/22 1522   Or   acetaminophen (TYLENOL) suppository 650 mg  650 mg Rectal Q6H PRN Athena Masse, MD       Ampicillin-Sulbactam (UNASYN) 3 g in sodium chloride 0.9 % 100 mL IVPB  3 g Intravenous Q6H Ravishankar, Joellyn Quails, MD 200 mL/hr at 06/08/22 1706 3 g at 06/08/22 1706   feeding supplement (OSMOLITE 1.5 CAL) liquid 237 mL  237 mL Per Tube QID Shawna Clamp, MD   237 mL at 06/08/22 2031   feeding supplement (PROSource  TF) liquid 45 mL  45 mL Per Tube BID Shawna Clamp, MD   45 mL at 06/08/22 2032   fluconazole (DIFLUCAN) IVPB 200 mg  200 mg Intravenous Q24H Tsosie Billing, MD 100 mL/hr at 06/08/22 2045 200 mg at 06/08/22 2045   gabapentin (NEURONTIN) 250 MG/5ML solution 600 mg  600 mg Per Tube TID Athena Masse, MD   600 mg at 06/08/22 2045   heparin flush 10 UNIT/ML injection 10 Units  10 Units Intracatheter Once Versie Starks, PA-C       lactated ringers infusion   Intravenous Continuous Athena Masse, MD 100 mL/hr at 06/08/22 2028 New Bag at 06/08/22 2028   morphine 10 MG/5ML solution 10 mg  10 mg Per Tube Q6H Athena Masse, MD   10 mg at 06/08/22 1412   multivitamin liquid 15 mL  15 mL Per Tube Daily Hallaji, Sheema M, RPH   15 mL at 06/08/22 0930   ondansetron (ZOFRAN) tablet 4 mg  4 mg Oral Q6H PRN Athena Masse, MD       Or   ondansetron [facility]) injection 4 mg  4 mg Intravenous Q6H PRN Athena Masse, MD   4 mg at 06/08/22 2030   oxyCODONE (ROXICODONE) 5 MG/5ML solution 5 mg  5 mg Per Tube Q4H PRN Athena Masse, MD   5 mg at 06/07/22 1602   QUEtiapine (SEROQUEL) tablet 100 mg  100  mg Per Tube QHS Judd Gaudier V, MD   100 mg at 06/08/22 2030   sodium chloride (OCEAN) 0.65 % nasal spray 1 spray  1 spray Each Nare PRN Emeterio Reeve, DO   1 spray at 06/07/22 1233   traZODone (DESYREL) tablet 100 mg  100 mg Per Tube QHS Athena Masse, MD   100 mg at 06/08/22 2030    Past Medical History:  Diagnosis Date   Bipolar 1 disorder ([place])    Seizures ([place])     Past Surgical History:  Procedure Laterality Date   CESAREAN SECTION      Social History Social History   Tobacco Use   Smoking status: Every Day    Types: Cigarettes   Smokeless tobacco: Never  Vaping Use   Vaping Use: Never used  Substance Use Topics   Alcohol use: Yes   Drug use: Never    Family History Family History  Family history unknown: Yes    Allergies  Allergen Reactions   Benadryl  [Diphenhydramine] Other (See Comments)    Somnolence when given 25 mg IV; consider low dose administration     REVIEW OF SYSTEMS (Negative unless checked) Patient currently poor historian unable to conduct review of systems  Physical Examination  Vitals:   06/08/22 0713 06/08/22 1113 06/08/22 1636 06/08/22 1938  BP: 115/72 134/86 134/79 (!) 151/80  Pulse: (!) 106 100 100 (!) 105  Resp: '17 18 18 19  '$ Temp: 97.7 F (36.5 C) 97.6 F (36.4 C) 97.6 F (36.4 C) 98.2 F (36.8 C)  TempSrc: Axillary Oral  Axillary  SpO2: 100% 92% 96% 98%  Weight:      Height:       Body mass index is 23.57 kg/m. Gen:  WD/WN, NAD, frail-appearing Head: [place]/AT, No temporalis wasting. Prominent temp pulse not noted. Ear/Nose/Throat: Swelling in left face, neck Eyes: Sclera non-icteric, conjunctiva clear Neck: Trachea midline.  No JVD.  Pulmonary:  Good air movement, respirations not labored, equal bilaterally.  Cardiac: RRR, normal S1, S2. Vascular:  Vessel Right Left  Radial Palpable Palpable   Gastrointestinal: soft, non-tender/non-distended. No guarding/reflex.  Musculoskeletal: M/S 5/5 throughout.  Extremities without ischemic changes.  No deformity or atrophy. No edema. Neurologic: Alert Psychiatric: Oriented x 2    CBC Lab Results  Component Value Date   WBC 15.9 (H) 06/08/2022   HGB 9.5 (L) 06/08/2022   HCT 29.0 (L) 06/08/2022   MCV 93.5 06/08/2022   PLT 294 06/08/2022    BMET    Component Value Date/Time   NA 129 (L) 06/08/2022 0454   NA 135 (L) 08/31/2013 0436   K 3.6 06/08/2022 0454   K 3.7 08/31/2013 0436   CL 92 (L) 06/08/2022 0454   CL 105 08/31/2013 0436   CO2 30 06/08/2022 0454   CO2 23 08/31/2013 0436   GLUCOSE 131 (H) 06/08/2022 0454   GLUCOSE 84 08/31/2013 0436   BUN 7 (L) 06/08/2022 0454   BUN 3 (L) 08/31/2013 0436   CREATININE <0.30 (L) 06/08/2022 0454   CREATININE 0.77 08/31/2013 0436   CALCIUM 8.3 (L) 06/08/2022 0454   CALCIUM 8.8 08/31/2013 0436    GFRNONAA NOT CALCULATED 06/08/2022 0454   GFRNONAA >60 08/31/2013 0436   GFRAA >60 08/31/2013 0436   CrCl cannot be calculated (This lab value cannot be used to calculate CrCl because it is not a number: <0.30).  COAG Lab Results  Component Value Date   INR 1.1 06/04/2022   INR 1.1 05/16/2022  Radiology DG Chest Port 1 View  Result Date: 06/06/2022 CLINICAL DATA:  61 year old female with hypoxia.  History of cancer. EXAM: PORTABLE CHEST 1 VIEW COMPARISON:  Portable chest 06/03/2022 and earlier. FINDINGS: Portable AP upright view at 0424 hours. Stable right chest power port. Mildly lower lung volumes. Streaky and veiling new right lung base opacity. No pneumothorax. No pulmonary edema suspected. Left lung appears stable and negative. Percutaneous gastrostomy tube visible with negative upper abdominal bowel gas pattern. No acute osseous abnormality identified. IMPRESSION: New right lung base opacity which appears to be a combination of airspace disease and small new pleural effusion. Consider aspiration and/or pneumonia. Electronically Signed   By: Genevie Ann M.D.   On: 06/06/2022 04:36   CT Soft Tissue Neck W Contrast  Result Date: 06/04/2022 CLINICAL DATA:  Hematologic malignancy EXAM: CT NECK WITH CONTRAST TECHNIQUE: Multidetector CT imaging of the neck was performed using the standard protocol following the bolus administration of intravenous contrast. RADIATION DOSE REDUCTION: This exam was performed according to the departmental dose-optimization program which includes automated exposure control, adjustment of the mA and/or kV according to patient size and/or use of iterative reconstruction technique. CONTRAST:  33m OMNIPAQUE IOHEXOL 350 MG/ML SOLN COMPARISON:  05/15/2022 FINDINGS: PHARYNX AND LARYNX: Rightward deviation of the pharynx and upper larynx is unchanged. There is persistent left asymmetric soft tissue thickening at the larynx. The airway is maintained. Post treatment thickening of  the epiglottis. Unchanged small retropharyngeal effusion. SALIVARY GLANDS: Post treatment hyperenhancement of the salivary glands is unchanged. THYROID: Normal. LYMPH NODES: Conglomerate nodal mass at left level 2A with areas of central necrosis is unchanged allowing for differences in scan quality and contrast bolus timing. 5 mm right level 1A node is also unchanged. No new lymphadenopathy. VASCULAR: There is a right chest wall Port-A-Cath the terminates below the field of view. The left internal jugular vein remains occluded. There is narrowing of the distal left ICA due to mass effect by the surrounding soft tissues, unchanged. LIMITED INTRACRANIAL: Normal. VISUALIZED ORBITS: Normal. MASTOIDS AND VISUALIZED PARANASAL SINUSES: No fluid levels or advanced mucosal thickening. No mastoid effusion. SKELETON: No bony spinal canal stenosis. No lytic or blastic lesions. UPPER CHEST: Clear. OTHER: Unchanged cutaneous lesion of the left retroauricular region. IMPRESSION: 1. Unchanged appearance of confluent nodal mass at left level 2A with areas of central necrosis. 2. Unchanged 5 mm right level 1A node. 3. Unchanged cutaneous lesion of the left retroauricular region. 4. 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 Electronically Signed   By: KUlyses JarredM.D.   On: 06/04/2022 00:18   CT HEAD WO CONTRAST (5MM)  Result Date: 06/03/2022 CLINICAL DATA:  Altered mental status, head and neck carcinoma EXAM: CT HEAD WITHOUT CONTRAST TECHNIQUE: Contiguous axial images were obtained from the base of the skull through the vertex without intravenous contrast. RADIATION DOSE REDUCTION: This exam was performed according to the departmental dose-optimization program which includes automated exposure control, adjustment of the mA and/or kV according to patient size and/or use of iterative reconstruction technique. COMPARISON:  MRI 05/16/2022 FINDINGS: Brain: Normal anatomic configuration. Parenchymal volume loss is  commensurate with the patient's age. Mild periventricular white matter changes are present likely reflecting the sequela of small vessel ischemia. No abnormal intra or extra-axial mass lesion or fluid collection. No abnormal mass effect or midline shift. No evidence of acute intracranial hemorrhage or infarct. Ventricular size is normal. Cerebellum unremarkable. Vascular: No asymmetric hyperdense vasculature at the skull base. Skull: Intact Sinuses/Orbits: Paranasal sinuses  are clear. Orbits are unremarkable. Other: Mastoid air cells and middle ear cavities are clear. Left retroauricular soft tissue mass is partially visualized at the inferior margin of the examination, better assessed on CT examination of the neck on 05/15/2022. IMPRESSION: 1. No acute intracranial abnormality. 2. Left retroauricular soft tissue mass partially visualized, better assessed on CT examination of the neck on 05/15/2022. Electronically Signed   By: Fidela Salisbury M.D.   On: 06/03/2022 22:12   DG Chest Port 1 View  Result Date: 06/03/2022 CLINICAL DATA:  Questionable sepsis EXAM: PORTABLE CHEST 1 VIEW COMPARISON:  05/15/2022 FINDINGS: The heart size and mediastinal contours are within normal limits. Right chest port catheter. Both lungs are clear. The visualized skeletal structures are unremarkable. IMPRESSION: No acute abnormality of the lungs in AP portable projection. Electronically Signed   By: Delanna Ahmadi M.D.   On: 06/03/2022 21:20   MR Brain W and Wo Contrast  Result Date: 05/16/2022 CLINICAL DATA:  Seizures and balance loss. History of head neck carcinoma. EXAM: MRI HEAD WITHOUT AND WITH CONTRAST TECHNIQUE: Multiplanar, multiecho pulse sequences of the brain and surrounding structures were obtained without and with intravenous contrast. CONTRAST:  50m GADAVIST GADOBUTROL 1 MMOL/ML IV SOLN COMPARISON:  None Available. FINDINGS: Brain: No acute infarct, mass effect or extra-axial collection. No acute or chronic hemorrhage.  Normal white matter signal, parenchymal volume and CSF spaces. The midline structures are normal. Vascular: Major flow voids are preserved. Skull and upper cervical spine: Findings within the soft tissues of the left neck are more completely described on the concomitant CT of the neck. Briefly, there is confluent lymphadenopathy with central necrosis and a postauricular skin lesion. Normal calvarium. Sinuses/Orbits:No paranasal sinus fluid levels or advanced mucosal thickening. No mastoid or middle ear effusion. Normal orbits. IMPRESSION: 1. Normal brain.  No intracranial metastatic disease. 2. Findings of disease spread within the soft tissues of the left neck are more completely described on the concomitant CT of the neck. Electronically Signed   By: KUlyses JarredM.D.   On: 05/16/2022 01:04   CT Soft Tissue Neck W Contrast  Result Date: 05/15/2022 CLINICAL DATA:  Soft tissue swelling. History of head neck carcinoma. EXAM: CT NECK WITH CONTRAST TECHNIQUE: Multidetector CT imaging of the neck was performed using the standard protocol following the bolus administration of intravenous contrast. RADIATION DOSE REDUCTION: This exam was performed according to the departmental dose-optimization program which includes automated exposure control, adjustment of the mA and/or kV according to patient size and/or use of iterative reconstruction technique. CONTRAST:  717mOMNIPAQUE IOHEXOL 300 MG/ML  SOLN COMPARISON:  None Available. FINDINGS: PHARYNX AND LARYNX: Diffuse edema of the soft tissues of the oropharynx, hypopharynx and larynx, including the epiglottis. There is a small retropharyngeal effusion but no retropharyngeal or peritonsillar abscess. SALIVARY GLANDS: Increased salivary gland enhancement is likely a post treatment effect. THYROID: Normal. LYMPH NODES: Left level 2A nodal conglomerate with area of central necrosis that measures approximately 1.4 cm. There is an enhancing 5 mm right level 1A node. VASCULAR:  The left internal jugular vein is thrombosed. There is abnormal contrast enhancement surrounding its course in the left neck, just distal to the carotid bifurcation. The other major vessels are patent. There is a right chest wall Port-A-Cath with its tip below the field of view. LIMITED INTRACRANIAL: Normal. VISUALIZED ORBITS: Normal. MASTOIDS AND VISUALIZED PARANASAL SINUSES: No fluid levels or advanced mucosal thickening. No mastoid effusion. SKELETON: No bony spinal canal stenosis. No lytic or blastic  lesions. UPPER CHEST: Clear. OTHER: There is a left retro auricular skin lesion measuring 2.5 x 1.3 cm. IMPRESSION: 1. Area of abnormal contrast enhancement at left level 2A, likely nodal conglomerate with areas of necrosis. There is likely extranodal extension with a cutaneous lesion of the retroauricular soft tissue. 2. Diffuse edema of the soft tissues of the oropharynx, hypopharynx and larynx, likely sequela of radiation therapy. 3. 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 Electronically Signed   By: Ulyses Jarred M.D.   On: 05/15/2022 23:03   DG Chest 2 View  Result Date: 05/15/2022 CLINICAL DATA:  Weakness EXAM: CHEST - 2 VIEW COMPARISON:  Chest radiograph 08/31/2013 FINDINGS: A right chest wall port is in place with tip terminating in the right atrium. The cardiomediastinal silhouette is normal. There is no focal consolidation or pulmonary edema. There is no pleural effusion or pneumothorax There is no acute osseous abnormality. IMPRESSION: No radiographic evidence of acute cardiopulmonary process. Electronically Signed   By: Valetta Mole M.D.   On: 05/15/2022 18:19   CT Head Wo Contrast  Result Date: 05/15/2022 CLINICAL DATA:  Slurred speech facial droop multiple falls EXAM: CT HEAD WITHOUT CONTRAST CT CERVICAL SPINE WITHOUT CONTRAST TECHNIQUE: Multidetector CT imaging of the head and cervical spine was performed following the standard protocol without intravenous contrast. Multiplanar CT image  reconstructions of the cervical spine were also generated. RADIATION DOSE REDUCTION: This exam was performed according to the departmental dose-optimization program which includes automated exposure control, adjustment of the mA and/or kV according to patient size and/or use of iterative reconstruction technique. COMPARISON:  None Available. FINDINGS: CT HEAD FINDINGS Brain: No evidence of acute infarction, hemorrhage, cerebral edema, mass, mass effect, or midline shift. No hydrocephalus or extra-axial fluid collection. Vascular: No hyperdense vessel. Skull: Normal. Negative for fracture or focal lesion. Sinuses/Orbits: Mild mucosal thickening in the maxillary sinuses. The orbits are unremarkable. Other: The mastoid air cells are well aerated. CT CERVICAL SPINE FINDINGS Alignment: Reversal of the normal cervical lordosis. No traumatic listhesis. Skull base and vertebrae: No acute fracture or suspicious osseous lesion. Soft tissues and spinal canal: No prevertebral fluid or swelling. No visible canal hematoma. Disc levels: No high-grade spinal canal stenosis or neural foraminal narrowing. Upper chest: No focal pulmonary opacity or pleural effusion. Other: Evaluation of the neck soft tissues is limited by the absence of intravenous contrast. Within this limitation, abnormal soft tissue in the left parapharyngeal space, in the region of left level 2A lymph nodes, extending into the left paravertebral musculature, and an exophytic lesion posterior to the left ear are favored to be related to the patient's known throat cancer. IMPRESSION: 1.  No acute intracranial process. 2.  No acute fracture or traumatic listhesis in the cervical spine. 3. Evaluation of the neck soft tissues is limited by the absence of intravenous contrast. Within this limitation, there are changes in the left neck that are likely related to the patient's throat cancer. Electronically Signed   By: Merilyn Baba M.D.   On: 05/15/2022 18:13   CT  Cervical Spine Wo Contrast  Result Date: 05/15/2022 CLINICAL DATA:  Slurred speech facial droop multiple falls EXAM: CT HEAD WITHOUT CONTRAST CT CERVICAL SPINE WITHOUT CONTRAST TECHNIQUE: Multidetector CT imaging of the head and cervical spine was performed following the standard protocol without intravenous contrast. Multiplanar CT image reconstructions of the cervical spine were also generated. RADIATION DOSE REDUCTION: This exam was performed according to the departmental dose-optimization program which includes automated exposure control, adjustment  of the mA and/or kV according to patient size and/or use of iterative reconstruction technique. COMPARISON:  None Available. FINDINGS: CT HEAD FINDINGS Brain: No evidence of acute infarction, hemorrhage, cerebral edema, mass, mass effect, or midline shift. No hydrocephalus or extra-axial fluid collection. Vascular: No hyperdense vessel. Skull: Normal. Negative for fracture or focal lesion. Sinuses/Orbits: Mild mucosal thickening in the maxillary sinuses. The orbits are unremarkable. Other: The mastoid air cells are well aerated. CT CERVICAL SPINE FINDINGS Alignment: Reversal of the normal cervical lordosis. No traumatic listhesis. Skull base and vertebrae: No acute fracture or suspicious osseous lesion. Soft tissues and spinal canal: No prevertebral fluid or swelling. No visible canal hematoma. Disc levels: No high-grade spinal canal stenosis or neural foraminal narrowing. Upper chest: No focal pulmonary opacity or pleural effusion. Other: Evaluation of the neck soft tissues is limited by the absence of intravenous contrast. Within this limitation, abnormal soft tissue in the left parapharyngeal space, in the region of left level 2A lymph nodes, extending into the left paravertebral musculature, and an exophytic lesion posterior to the left ear are favored to be related to the patient's known throat cancer. IMPRESSION: 1.  No acute intracranial process. 2.  No  acute fracture or traumatic listhesis in the cervical spine. 3. Evaluation of the neck soft tissues is limited by the absence of intravenous contrast. Within this limitation, there are changes in the left neck that are likely related to the patient's throat cancer. Electronically Signed   By: Merilyn Baba M.D.   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  In order to attempt to prevent catastrophic hemorrhage, we can perform external carotid artery embolization with covered stent placement in the common internal carotid arteries.  This would significantly reduce the risk of bleeding.  However with this it would pose significant risk of thrombosis given the tumor encasement.  That would possibly cause a stroke going forward.  The procedure itself there is also risk for stroke.  However without intervention the patient has a possible life-threatening complication of hemorrhage. I contacted the patient's son Christina Porter to what date him including discussing the procedure as well as risk and benefits.  We discussed this as an option if the patient wished to continue to be aggressive with her treatment.  At Ryan's request I also contacted the patient's other son who is an Therapist, sports in [place].  I have also discussed the risk benefits and alternatives with him as well.  To have time to discuss this evening.  Will contact him in the morning determine if he wishes to move forward.  If so, we will plan on intervention tomorrow.  We will have the patient's tube feed boluses held in preparation.   Family Communication: Called and discussed case with son, Christina Porter, and at his request contacted son Christina Porter to discuss case procedure as well.  Thank you for allowing [place] to participate in the care of this patient.   Kris Hartmann, NP [facility] [phone] (Office Phone) [phone] (Office Fax) [phone] (Pager)  06/08/2022 11:36 PM  Staff may message me via secure chat in [place]  but this may not receive immediate response,  please page for urgent matters!  Dictation software was used to generate the above note. Typos may occur and escape review, as with typed/written  notes. Any error is purely unintentional.  Please contact me directly for clarity if needed.

## 2022-06-09 ENCOUNTER — Inpatient Hospital Stay: Payer: Medicaid Other | Admitting: Anesthesiology

## 2022-06-09 ENCOUNTER — Encounter
Admission: EM | Disposition: A | Discharge status: Hospice | Payer: Self-pay | Source: Home / Self Care | Attending: Osteopathic Medicine

## 2022-06-09 ENCOUNTER — Inpatient Hospital Stay: Payer: Medicaid Other

## 2022-06-09 ENCOUNTER — Other Ambulatory Visit: Payer: Self-pay

## 2022-06-09 ENCOUNTER — Encounter: Payer: Self-pay | Admitting: Vascular Surgery

## 2022-06-09 DIAGNOSIS — J9601 Acute respiratory failure with hypoxia: Secondary | ICD-10-CM | POA: Diagnosis present

## 2022-06-09 DIAGNOSIS — Q2549 Other congenital malformations of aorta: Secondary | ICD-10-CM

## 2022-06-09 DIAGNOSIS — Z930 Tracheostomy status: Secondary | ICD-10-CM

## 2022-06-09 DIAGNOSIS — Z9889 Other specified postprocedural states: Secondary | ICD-10-CM

## 2022-06-09 DIAGNOSIS — I82C19 Acute embolism and thrombosis of unspecified internal jugular vein: Secondary | ICD-10-CM

## 2022-06-09 DIAGNOSIS — I772 Rupture of artery: Secondary | ICD-10-CM

## 2022-06-09 DIAGNOSIS — I771 Stricture of artery: Secondary | ICD-10-CM

## 2022-06-09 DIAGNOSIS — R6510 Systemic inflammatory response syndrome (SIRS) of non-infectious origin without acute organ dysfunction: Secondary | ICD-10-CM | POA: Diagnosis not present

## 2022-06-09 DIAGNOSIS — Z7189 Other specified counseling: Secondary | ICD-10-CM | POA: Diagnosis not present

## 2022-06-09 HISTORY — PX: CAROTID ANGIOGRAPHY: CATH118230

## 2022-06-09 HISTORY — PX: TRACHEOSTOMY TUBE PLACEMENT: SHX814

## 2022-06-09 LAB — GLUCOSE, CAPILLARY
Glucose-Capillary: 129 mg/dL — ABNORMAL HIGH (ref 70–99)
Glucose-Capillary: 129 mg/dL — ABNORMAL HIGH (ref 70–99)
Glucose-Capillary: 143 mg/dL — ABNORMAL HIGH (ref 70–99)
Glucose-Capillary: 146 mg/dL — ABNORMAL HIGH (ref 70–99)
Glucose-Capillary: 163 mg/dL — ABNORMAL HIGH (ref 70–99)
Glucose-Capillary: 169 mg/dL — ABNORMAL HIGH (ref 70–99)

## 2022-06-09 LAB — COMPREHENSIVE METABOLIC PANEL
ALT: 16 U/L (ref 0–44)
AST: 23 U/L (ref 15–41)
Albumin: 1.7 g/dL — ABNORMAL LOW (ref 3.5–5.0)
Alkaline Phosphatase: 86 U/L (ref 38–126)
Anion gap: 9 (ref 5–15)
BUN: 9 mg/dL (ref 8–23)
CO2: 33 mmol/L — ABNORMAL HIGH (ref 22–32)
Calcium: 8.2 mg/dL — ABNORMAL LOW (ref 8.9–10.3)
Chloride: 89 mmol/L — ABNORMAL LOW (ref 98–111)
Creatinine, Ser: 0.44 mg/dL (ref 0.44–1.00)
GFR, Estimated: >60 mL/min (ref >60)
Glucose, Bld: 114 mg/dL — ABNORMAL HIGH (ref 70–99)
Potassium: 3.5 mmol/L (ref 3.5–5.1)
Sodium: 131 mmol/L — ABNORMAL LOW (ref 135–145)
Total Bilirubin: 0.3 mg/dL (ref 0.3–1.2)
Total Protein: 5 g/dL — ABNORMAL LOW (ref 6.5–8.1)

## 2022-06-09 LAB — PROTIME-INR
INR: 1 (ref 0.8–1.2)
Prothrombin Time: 13.3 seconds (ref 11.4–15.2)

## 2022-06-09 LAB — CBC
HCT: 32.4 % — ABNORMAL LOW (ref 36.0–46.0)
Hemoglobin: 11.3 g/dL — ABNORMAL LOW (ref 12.0–15.0)
MCH: 31.3 pg (ref 26.0–34.0)
MCHC: 34.9 g/dL (ref 30.0–36.0)
MCV: 89.8 fL (ref 80.0–100.0)
Platelets: 463 10*3/uL — ABNORMAL HIGH (ref 150–400)
RBC: 3.61 MIL/uL — ABNORMAL LOW (ref 3.87–5.11)
RDW: 13.5 % (ref 11.5–15.5)
WBC: 16.4 10*3/uL — ABNORMAL HIGH (ref 4.0–10.5)
nRBC: 0 % (ref 0.0–0.2)

## 2022-06-09 LAB — HEMOGLOBIN AND HEMATOCRIT, BLOOD
HCT: 26.4 % — ABNORMAL LOW (ref 36.0–46.0)
HCT: 26.7 % — ABNORMAL LOW (ref 36.0–46.0)
Hemoglobin: 8.8 g/dL — ABNORMAL LOW (ref 12.0–15.0)
Hemoglobin: 9 g/dL — ABNORMAL LOW (ref 12.0–15.0)

## 2022-06-09 LAB — BLOOD GAS, ARTERIAL
Acid-Base Excess: 11.2 mmol/L — ABNORMAL HIGH (ref 0.0–2.0)
Bicarbonate: 35.1 mmol/L — ABNORMAL HIGH (ref 20.0–28.0)
FIO2: 40 %
MECHVT: 400 mL
Mechanical Rate: 16
O2 Saturation: 96.6 %
PEEP: 5 cmH2O
Patient temperature: 37
pCO2 arterial: 42 mmHg (ref 32–48)
pH, Arterial: 7.53 — ABNORMAL HIGH (ref 7.35–7.45)
pO2, Arterial: 62 mmHg — ABNORMAL LOW (ref 83–108)

## 2022-06-09 LAB — APTT: aPTT: 31 seconds (ref 24–36)

## 2022-06-09 SURGERY — CREATION, TRACHEOSTOMY
Anesthesia: General

## 2022-06-09 SURGERY — CAROTID ANGIOGRAPHY
Anesthesia: Moderate Sedation | Laterality: Left

## 2022-06-09 MED ORDER — FENTANYL BOLUS VIA INFUSION
50.0000 ug | INTRAVENOUS | Status: DC | PRN
  Administered 2022-06-09: 200 ug via INTRAVENOUS

## 2022-06-09 MED ORDER — VASOPRESSIN 20 UNIT/ML IV SOLN
INTRAVENOUS | Status: AC
  Filled 2022-06-09: qty 1

## 2022-06-09 MED ORDER — ROCURONIUM BROMIDE 100 MG/10ML IV SOLN
INTRAVENOUS | Status: DC | PRN
  Administered 2022-06-09 (×2): 50 mg via INTRAVENOUS

## 2022-06-09 MED ORDER — SODIUM CHLORIDE 0.9 % IV SOLN: INTRAVENOUS | Status: DC

## 2022-06-09 MED ORDER — DEXAMETHASONE SODIUM PHOSPHATE 10 MG/ML IJ SOLN
INTRAMUSCULAR | Status: DC | PRN
  Administered 2022-06-09: 10 mg via INTRAVENOUS

## 2022-06-09 MED ORDER — SODIUM CHLORIDE 0.9% FLUSH: 10.0000 mL | INTRAVENOUS | Status: DC | PRN

## 2022-06-09 MED ORDER — 0.9 % SODIUM CHLORIDE (POUR BTL) OPTIME
TOPICAL | Status: DC | PRN
  Administered 2022-06-09: 500 mL

## 2022-06-09 MED ORDER — SODIUM CHLORIDE 0.9 % IV SOLN
250.0000 mL | INTRAVENOUS | Status: DC
  Administered 2022-06-09: 250 mL via INTRAVENOUS

## 2022-06-09 MED ORDER — KETAMINE HCL 50 MG/5ML IJ SOSY
PREFILLED_SYRINGE | INTRAMUSCULAR | Status: AC
  Filled 2022-06-09: qty 5

## 2022-06-09 MED ORDER — ASPIRIN 81 MG PO TBEC: 81.0000 mg | DELAYED_RELEASE_TABLET | Freq: Every day | ORAL | Status: DC

## 2022-06-09 MED ORDER — NOREPINEPHRINE 4 MG/250ML-% IV SOLN
INTRAVENOUS | Status: AC
  Filled 2022-06-09: qty 250

## 2022-06-09 MED ORDER — PHENYLEPHRINE HCL-NACL 20-0.9 MG/250ML-% IV SOLN
INTRAVENOUS | Status: DC | PRN
  Administered 2022-06-09: 40 ug/min via INTRAVENOUS

## 2022-06-09 MED ORDER — MIDAZOLAM HCL 2 MG/2ML IJ SOLN: 2.0000 mg | INTRAMUSCULAR | Status: DC | PRN

## 2022-06-09 MED ORDER — LIDOCAINE-EPINEPHRINE 1 %-1:100000 IJ SOLN
INTRAMUSCULAR | Status: DC | PRN
  Administered 2022-06-09: 4 mL

## 2022-06-09 MED ORDER — TRANEXAMIC ACID FOR INHALATION
500.0000 mg | Freq: Once | RESPIRATORY_TRACT | Status: DC
  Filled 2022-06-09: qty 10

## 2022-06-09 MED ORDER — FAMOTIDINE 20 MG PO TABS: 40.0000 mg | ORAL_TABLET | Freq: Once | ORAL | Status: DC | PRN

## 2022-06-09 MED ORDER — PROPOFOL 1000 MG/100ML IV EMUL
5.0000 ug/kg/min | INTRAVENOUS | Status: DC
  Administered 2022-06-09: 40 ug/kg/min via INTRAVENOUS
  Administered 2022-06-09: 30 ug/kg/min via INTRAVENOUS
  Administered 2022-06-09: 50 ug/kg/min via INTRAVENOUS
  Filled 2022-06-09 (×3): qty 100

## 2022-06-09 MED ORDER — CLOPIDOGREL BISULFATE 75 MG PO TABS
75.0000 mg | ORAL_TABLET | Freq: Every day | ORAL | Status: DC
  Administered 2022-06-09 – 2022-06-20 (×12): 75 mg
  Filled 2022-06-09 (×12): qty 1

## 2022-06-09 MED ORDER — FENTANYL CITRATE PF 50 MCG/ML IJ SOSY: 50.0000 ug | PREFILLED_SYRINGE | INTRAMUSCULAR | Status: DC | PRN

## 2022-06-09 MED ORDER — IODIXANOL 320 MG/ML IV SOLN
INTRAVENOUS | Status: DC | PRN
  Administered 2022-06-09: 45 mL via INTRA_ARTERIAL

## 2022-06-09 MED ORDER — PHENYLEPHRINE 80 MCG/ML (10ML) SYRINGE FOR IV PUSH (FOR BLOOD PRESSURE SUPPORT)
PREFILLED_SYRINGE | INTRAVENOUS | Status: DC | PRN
  Administered 2022-06-09 (×4): 160 ug via INTRAVENOUS

## 2022-06-09 MED ORDER — KETAMINE HCL 10 MG/ML IJ SOLN
INTRAMUSCULAR | Status: DC | PRN
  Administered 2022-06-09 (×3): 10 mg via INTRAVENOUS

## 2022-06-09 MED ORDER — FENTANYL CITRATE (PF) 100 MCG/2ML IJ SOLN
INTRAMUSCULAR | Status: AC
  Filled 2022-06-09: qty 2

## 2022-06-09 MED ORDER — CHLORHEXIDINE GLUCONATE CLOTH 2 % EX PADS
6.0000 | MEDICATED_PAD | Freq: Every day | CUTANEOUS | Status: DC
Start: 2022-06-09 — End: 2022-06-21
  Administered 2022-06-10 – 2022-06-20 (×10): 6 via TOPICAL

## 2022-06-09 MED ORDER — FENTANYL CITRATE PF 50 MCG/ML IJ SOSY
50.0000 ug | PREFILLED_SYRINGE | INTRAMUSCULAR | Status: DC | PRN
  Administered 2022-06-09 (×2): 50 ug via INTRAVENOUS
  Filled 2022-06-09: qty 1

## 2022-06-09 MED ORDER — DOCUSATE SODIUM 50 MG/5ML PO LIQD
100.0000 mg | Freq: Two times a day (BID) | ORAL | Status: DC
  Administered 2022-06-09 – 2022-06-10 (×3): 100 mg
  Filled 2022-06-09 (×4): qty 10

## 2022-06-09 MED ORDER — MIDAZOLAM HCL 2 MG/2ML IJ SOLN
INTRAMUSCULAR | Status: DC | PRN
  Administered 2022-06-09: 2 mg via INTRAVENOUS

## 2022-06-09 MED ORDER — MORPHINE SULFATE (CONCENTRATE) 10 MG/0.5ML PO SOLN
10.0000 mg | Freq: Four times a day (QID) | ORAL | Status: DC
  Administered 2022-06-09 – 2022-06-14 (×20): 10 mg
  Filled 2022-06-09 (×21): qty 0.5

## 2022-06-09 MED ORDER — PHENYLEPHRINE HCL-NACL 20-0.9 MG/250ML-% IV SOLN
INTRAVENOUS | Status: AC
  Filled 2022-06-09: qty 250

## 2022-06-09 MED ORDER — FENTANYL 2500MCG IN NS 250ML (10MCG/ML) PREMIX INFUSION
INTRAVENOUS | Status: AC
  Filled 2022-06-09: qty 250

## 2022-06-09 MED ORDER — ASPIRIN 81 MG PO CHEW
81.0000 mg | CHEWABLE_TABLET | Freq: Every day | ORAL | Status: DC
  Administered 2022-06-09 – 2022-06-20 (×12): 81 mg
  Filled 2022-06-09 (×12): qty 1

## 2022-06-09 MED ORDER — PHENYLEPHRINE 80 MCG/ML (10ML) SYRINGE FOR IV PUSH (FOR BLOOD PRESSURE SUPPORT)
PREFILLED_SYRINGE | INTRAVENOUS | Status: AC
  Filled 2022-06-09: qty 10

## 2022-06-09 MED ORDER — FENTANYL CITRATE (PF) 100 MCG/2ML IJ SOLN
INTRAMUSCULAR | Status: DC | PRN
  Administered 2022-06-09 (×2): 25 ug via INTRAVENOUS
  Administered 2022-06-09: 50 ug via INTRAVENOUS

## 2022-06-09 MED ORDER — NOREPINEPHRINE 4 MG/250ML-% IV SOLN: 0.0000 ug/min | INTRAVENOUS | Status: DC

## 2022-06-09 MED ORDER — KETAMINE HCL 10 MG/ML IJ SOLN: INTRAMUSCULAR | Status: DC | PRN

## 2022-06-09 MED ORDER — PROPOFOL 10 MG/ML IV BOLUS
INTRAVENOUS | Status: AC
  Filled 2022-06-09: qty 20

## 2022-06-09 MED ORDER — METHYLPREDNISOLONE SODIUM SUCC 125 MG IJ SOLR: 125.0000 mg | Freq: Once | INTRAMUSCULAR | Status: DC | PRN

## 2022-06-09 MED ORDER — PANTOPRAZOLE 2 MG/ML SUSPENSION
40.0000 mg | Freq: Every day | ORAL | Status: DC
  Administered 2022-06-10 – 2022-06-20 (×11): 40 mg
  Filled 2022-06-09 (×12): qty 20

## 2022-06-09 MED ORDER — POLYETHYLENE GLYCOL 3350 17 G PO PACK
17.0000 g | PACK | Freq: Every day | ORAL | Status: DC
  Administered 2022-06-10 – 2022-06-19 (×5): 17 g
  Filled 2022-06-09 (×9): qty 1

## 2022-06-09 MED ORDER — ORAL CARE MOUTH RINSE: 15.0000 mL | OROMUCOSAL | Status: DC | PRN

## 2022-06-09 MED ORDER — NOREPINEPHRINE 4 MG/250ML-% IV SOLN
0.0000 ug/min | INTRAVENOUS | Status: DC
  Administered 2022-06-09: 2 ug/min via INTRAVENOUS
  Administered 2022-06-10: 6 ug/min via INTRAVENOUS
  Administered 2022-06-10: 4 ug/min via INTRAVENOUS
  Filled 2022-06-09 (×2): qty 250

## 2022-06-09 MED ORDER — HYDROMORPHONE HCL 1 MG/ML IJ SOLN: 1.0000 mg | Freq: Once | INTRAMUSCULAR | Status: DC | PRN

## 2022-06-09 MED ORDER — ORAL CARE MOUTH RINSE
15.0000 mL | OROMUCOSAL | Status: DC
  Administered 2022-06-09 – 2022-06-11 (×19): 15 mL via OROMUCOSAL

## 2022-06-09 MED ORDER — FENTANYL CITRATE PF 50 MCG/ML IJ SOSY
50.0000 ug | PREFILLED_SYRINGE | INTRAMUSCULAR | Status: DC | PRN
  Filled 2022-06-09: qty 1

## 2022-06-09 MED ORDER — HEPARIN SODIUM (PORCINE) 1000 UNIT/ML IJ SOLN
INTRAMUSCULAR | Status: AC
  Filled 2022-06-09: qty 10

## 2022-06-09 MED ORDER — DEXMEDETOMIDINE HCL IN NACL 400 MCG/100ML IV SOLN
INTRAVENOUS | Status: AC
  Filled 2022-06-09: qty 100

## 2022-06-09 MED ORDER — SODIUM CHLORIDE 0.9% FLUSH
10.0000 mL | Freq: Two times a day (BID) | INTRAVENOUS | Status: DC
  Administered 2022-06-09 – 2022-06-18 (×16): 10 mL

## 2022-06-09 MED ORDER — ATROPINE SULFATE 1 MG/10ML IJ SOSY
PREFILLED_SYRINGE | INTRAMUSCULAR | Status: AC
  Filled 2022-06-09: qty 10

## 2022-06-09 MED ORDER — MIDAZOLAM HCL 2 MG/2ML IJ SOLN
INTRAMUSCULAR | Status: AC
  Filled 2022-06-09: qty 2

## 2022-06-09 MED ORDER — VASOPRESSIN 20 UNIT/ML IV SOLN
INTRAVENOUS | Status: DC | PRN
  Administered 2022-06-09 (×3): 1 [IU] via INTRAVENOUS

## 2022-06-09 MED ORDER — DIPHENHYDRAMINE HCL 50 MG/ML IJ SOLN: 50.0000 mg | Freq: Once | INTRAMUSCULAR | Status: DC | PRN

## 2022-06-09 MED ORDER — DOPAMINE-DEXTROSE 3.2-5 MG/ML-% IV SOLN
INTRAVENOUS | Status: AC
  Filled 2022-06-09: qty 250

## 2022-06-09 MED ORDER — DEXMEDETOMIDINE HCL IN NACL 400 MCG/100ML IV SOLN
0.0000 ug/kg/h | INTRAVENOUS | Status: DC
  Administered 2022-06-09: 0.6 ug/kg/h via INTRAVENOUS
  Administered 2022-06-09: 1 ug/kg/h via INTRAVENOUS
  Administered 2022-06-09: 0.8 ug/kg/h via INTRAVENOUS

## 2022-06-09 MED ORDER — HEPARIN SODIUM (PORCINE) 1000 UNIT/ML IJ SOLN
INTRAMUSCULAR | Status: DC | PRN
  Administered 2022-06-09: 5000 [IU] via INTRAVENOUS

## 2022-06-09 MED ORDER — MIDAZOLAM HCL 2 MG/ML PO SYRP: 8.0000 mg | ORAL_SOLUTION | Freq: Once | ORAL | Status: DC | PRN

## 2022-06-09 MED ORDER — CEFAZOLIN SODIUM-DEXTROSE 2-4 GM/100ML-% IV SOLN: 2.0000 g | INTRAVENOUS | Status: DC

## 2022-06-09 MED ORDER — PANTOPRAZOLE SODIUM 40 MG IV SOLR
40.0000 mg | INTRAVENOUS | Status: DC
  Administered 2022-06-09: 40 mg via INTRAVENOUS
  Filled 2022-06-09: qty 10

## 2022-06-09 MED ORDER — ONDANSETRON HCL 4 MG/2ML IJ SOLN: 4.0000 mg | Freq: Four times a day (QID) | INTRAMUSCULAR | Status: DC | PRN

## 2022-06-09 SURGICAL SUPPLY — 39 items
BLADE SURG 15 STRL LF DISP TIS (BLADE) ×1 IMPLANT
BLADE SURG 15 STRL SS (BLADE) ×2
BLADE SURG SZ11 CARB STEEL (BLADE) ×2 IMPLANT
DRAPE MAG INST 16X20 L/F (DRAPES) ×2 IMPLANT
ELECT CAUTERY BLADE TIP 2.5 (TIP) ×2
ELECT REM PT RETURN 9FT ADLT (ELECTROSURGICAL) ×2
ELECTRODE CAUTERY BLDE TIP 2.5 (TIP) ×1 IMPLANT
ELECTRODE REM PT RTRN 9FT ADLT (ELECTROSURGICAL) ×1 IMPLANT
GAUZE 4X4 16PLY ~~LOC~~+RFID DBL (SPONGE) ×3 IMPLANT
GLOVE BIO SURGEON STRL SZ7.5 (GLOVE) ×2 IMPLANT
GOWN STRL REUS W/ TWL LRG LVL3 (GOWN DISPOSABLE) ×2 IMPLANT
GOWN STRL REUS W/TWL LRG LVL3 (GOWN DISPOSABLE) ×4
HEMOSTAT SURGICEL 2X3 (HEMOSTASIS) IMPLANT
HLDR TRACH TUBE NECKBAND 18 (MISCELLANEOUS) ×1 IMPLANT
HOLDER TRACH TUBE NECKBAND 18 (MISCELLANEOUS) ×2
KIT TURNOVER KIT A (KITS) ×2 IMPLANT
LABEL OR SOLS (LABEL) ×2 IMPLANT
MANIFOLD NEPTUNE II (INSTRUMENTS) ×2 IMPLANT
NS IRRIG 500ML POUR BTL (IV SOLUTION) ×2 IMPLANT
PACK HEAD/NECK (MISCELLANEOUS) ×2 IMPLANT
SHEARS HARMONIC 9CM CVD (BLADE) ×2 IMPLANT
SPONGE DRAIN TRACH 4X4 STRL 2S (GAUZE/BANDAGES/DRESSINGS) ×2 IMPLANT
SPONGE KITTNER 5P (MISCELLANEOUS) ×2 IMPLANT
SUCTION FRAZIER HANDLE 10FR (MISCELLANEOUS) ×2
SUCTION TUBE FRAZIER 10FR DISP (MISCELLANEOUS) ×1 IMPLANT
SUT ETHILON 2 0 FS 18 (SUTURE) ×2 IMPLANT
SUT PROLENE 2 0 SH DA (SUTURE) ×1 IMPLANT
SUT SILK 2 0 (SUTURE)
SUT SILK 2-0 18XBRD TIE 12 (SUTURE) IMPLANT
SUT VIC AB 4-0 SH 27 (SUTURE) ×2
SUT VIC AB 4-0 SH 27XANBCTRL (SUTURE) IMPLANT
SUT VICRYL+ 4-0 18IN PS-4 (SUTURE) ×2 IMPLANT
SYR 10ML LL (SYRINGE) ×2 IMPLANT
TRAP FLUID SMOKE EVACUATOR (MISCELLANEOUS) ×2 IMPLANT
TUBE TRACH  6.0 CUFF FLEX (MISCELLANEOUS) ×2
TUBE TRACH 6.0 CUFF FLEX (MISCELLANEOUS) IMPLANT
TUBE TRACH FLEX 8.0 CUFF (MISCELLANEOUS)
TUBE TRACH FLEX 8.5 CUFF (MISCELLANEOUS) IMPLANT
WATER STERILE IRR 500ML POUR (IV SOLUTION) ×2 IMPLANT

## 2022-06-09 SURGICAL SUPPLY — 32 items
CATH ANGIO 5F PIGTAIL 100CM (CATHETERS) ×1 IMPLANT
CATH BEACON 5 .035 100 H1 TIP (CATHETERS) ×1 IMPLANT
CATH BEACON 5 .035 100 JB2 TIP (CATHETERS) ×1 IMPLANT
CATH MICROCATH PRGRT 2.8F 130 (MICROCATHETER) IMPLANT
COIL 400 COMPLEX SOFT 4X15CM (Vascular Products) ×1 IMPLANT
COIL 400 COMPLEX STD 4X10CM (Vascular Products) ×1 IMPLANT
COIL 400 COMPLEX STD 4X20CM (Vascular Products) ×1 IMPLANT
COIL 400 COMPLEX STD 5X30CM (Vascular Products) ×1 IMPLANT
COVER DRAPE FLUORO 36X44 (DRAPES) ×1 IMPLANT
DEVICE EMBOSHIELD NAV6 4.0-7.0 (FILTER) ×1 IMPLANT
DEVICE OCCLUSION PODJ5 (Embolic) IMPLANT
DEVICE SAFEGUARD 24CM (GAUZE/BANDAGES/DRESSINGS) ×1 IMPLANT
DEVICE STARCLOSE SE CLOSURE (Vascular Products) ×1 IMPLANT
DEVICE TORQUE .025-.038 (MISCELLANEOUS) ×1 IMPLANT
GLIDEWIRE ANGLED SS 035X260CM (WIRE) ×1 IMPLANT
GUIDEWIRE ANGLED .035X260CM (WIRE) ×1 IMPLANT
GUIDEWIRE VASC STIFF .038X260 (WIRE) ×1 IMPLANT
HANDLE DETACHMENT COIL (MISCELLANEOUS) ×1 IMPLANT
KIT CAROTID MANIFOLD (MISCELLANEOUS) ×1 IMPLANT
KIT ENCORE 26 ADVANTAGE (KITS) ×1 IMPLANT
MICROCATH PROGREAT 2.8F 130CM (MICROCATHETER) ×2
OCCLUSION DEVICE PODJ5 (Embolic) ×2 IMPLANT
PACK ANGIOGRAPHY (CUSTOM PROCEDURE TRAY) ×2 IMPLANT
SHEATH BRITE TIP 6FRX11 (SHEATH) ×1 IMPLANT
SHEATH PINNACLE ST 7F 65CM (SHEATH) ×1 IMPLANT
SHEATH PROBE COVER 6X72 (BAG) ×1 IMPLANT
SHEATH SHUTTLE 7FR (SHEATH) ×1 IMPLANT
STENT VIABAHN 8X100X120 (Permanent Stent) ×2 IMPLANT
STENT VIABAHN 8X10X120 (Permanent Stent) IMPLANT
SYR MEDRAD MARK 7 150ML (SYRINGE) ×1 IMPLANT
WIRE BAREWIRE WORK .014X315CM (WIRE) ×1 IMPLANT
WIRE GUIDERIGHT .035X150 (WIRE) ×1 IMPLANT

## 2022-06-09 NOTE — Consult Note (Signed)
Rapid response called on patient this morning to due respiratory distress secondary to recurrent bleeding.  Asked by ICU for airway placement.  Trans-nasal flexible laryngoscopy performed that showed large oropharyngeal mass and blood filling most of visible oropharynx and pharynx.  Edematous epiglottis visible and larynx/supraglottis covered in bloody mucus.  Awake tracheostomy tube placement determined to be safer option for placement of airway.  Discussed with patient who was confused and will call son for consent.

## 2022-06-09 NOTE — Anesthesia Preprocedure Evaluation (Signed)
Anesthesia Evaluation  Patient identified by MRN, date of birth, ID band Patient awake and Patient confused  General Assessment Comment:  15F with head and neck cancer, large oropharyngeal mass per ENT surgeon. Rapid response called on her this morning for respiratory distress and oral bleeding. Determined by ENT that awake trach is ideal intervention. Patient with garbled voice and difficult to understand, unclear if she understands and is able to consent.  Reviewed: Allergy & Precautions, NPO status , Patient's Chart, lab work & pertinent test results  History of Anesthesia Complications Negative for: history of anesthetic complications  Airway Mallampati: Unable to assess  TM Distance: >3 FB Neck ROM: Limited  Mouth opening: Limited Mouth Opening  Dental   Unable to assess, bloody mouth and teeth:   Pulmonary neg sleep apnea, neg COPD, Current Smoker and Patient abstained from smoking.,    + rhonchi        Cardiovascular Exercise Tolerance: Poor METS(-) hypertension(-) CAD and (-) Past MI negative cardio ROS  (-) dysrhythmias  Rhythm:Regular Rate:Tachycardia - Systolic murmurs    Neuro/Psych Seizures -,  PSYCHIATRIC DISORDERS Bipolar Disorder    GI/Hepatic neg GERD  ,(+)     (-) substance abuse  ,   Endo/Other  neg diabetes  Renal/GU negative Renal ROS     Musculoskeletal   Abdominal   Peds  Hematology   Anesthesia Other Findings Past Medical History: No date: Bipolar 1 disorder (HCC) No date: Seizures (HCC)  Reproductive/Obstetrics                             Anesthesia Physical Anesthesia Plan  ASA: 5 and emergent  Anesthesia Plan: General   Post-op Pain Management:    Induction: Intravenous  PONV Risk Score and Plan: 3 and Ondansetron, Treatment may vary due to age or medical condition and Dexamethasone  Airway Management Planned: Mask and Tracheostomy  Additional  Equipment: None  Intra-op Plan:   Post-operative Plan: Post-operative intubation/ventilation  Informed Consent: I have reviewed the patients History and Physical, chart, labs and discussed the procedure including the risks, benefits and alternatives for the proposed anesthesia with the patient or authorized representative who has indicated his/her understanding and acceptance.     Dental advisory given and Consent reviewed with POA  Plan Discussed with: CRNA and Surgeon  Anesthesia Plan Comments: (Discussed risks of anesthesia with patient, including PONV, sore throat, lip/dental/eye damage. Rare risks discussed as well, such as cardiorespiratory and neurological sequelae, and allergic reactions. Patient counseled on being higher risk for anesthesia due to comorbidities: acute respiratory failure and airway mass. Patient was told about increased risk of cardiac and respiratory events, including death. It is unclear how much she understood, so I reiterated all of the above to her son Barbette Reichmann by phone preoperatively, including the risk of death. I asked about DNR status, and he state that in the event her heart were to stop or she were to stop breathing, to "save her life." Discussed the role of CRNA in patient's perioperative care. )        Anesthesia Quick Evaluation

## 2022-06-09 NOTE — Progress Notes (Addendum)
[employer] - Patient with hemoptysis  Blood also noted from nose Sonorous respiration with use of accessory muscles Sats currently stable BP 117/78  Action - Stat H&H, PT, PTT Transfer to ICU Consult PCCM concern for airway protection   """ Christina Porter updated via phone

## 2022-06-09 NOTE — Progress Notes (Addendum)
Daily Progress Note   Patient Name: Christina Porter. Christina Porter       Date: 06/09/2022 DOB: 1961/10/30  Age: 61 y.o. MRN#: [id number] Attending Physician: Armando Reichert, MD Primary Care Physician: Neomia Dear, MD Admit Date: 06/03/2022  Reason for Consultation/Follow-up: Establishing goals of care  Subjective: Notes reviewed. Patient had awake intubation and was moved to ICU. 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 I have spoken with CCM and CCM staff. Continue to recommend son speak to [facility] oncologist directly to verbalize the patient and family's perception of expected outcomes, and have a frank conversation regarding her status and prognosis. She has an extremely poor prognosis, but patient has understood [name] oncology to recommend fighting to live, and has discussed good outcomes with oncologic intervention.  Recommend that a time be arranged where the oncologist can E link (like family members e link in) in to her room when son is present at bedside.  ADDENDUM: Spoke with son [name] by phone, he states he will plan to return to the hospital later this afternoon.  We discussed his mother status.  We discussed their conversations with [facility].  He states that [facility] had advised her to keep fighting.  He states that they did not actually confirm an outcome.   He is aware of her current medical team's thoughts on a poor prognosis.  Discussed his speaking with her [facility] oncology team.  He tells me that his decisions on his mother's care moving forward would be impacted by their oncologist's advisement.  Discussed speaking with them and asking questions regarding predicted outcomes, to clarify and ensure everyone has the same understanding and expectations.  Recommended to  son that the oncologist be E linked in to her room so that he is able to see her, and they are able to have a face-to-face conversation with son.  Recommended to discuss what if any chance there is of cure, and if cure is not possible and the treatments are palliative in nature, are they for symptom management or to prolong life, and if they are to prolong life, how much time are they prognosticating would be added by doing the interventions versus hospice/comfort focused care.  Discussed using these answers to help make decisions moving forward on continued aggressive care versus comfort focused care.  He states he would like to have his mother back at home now that she has a tracheostomy and carotid arteries have been stented.  Advised that I will follow-up Monday on my return.    Length of Stay: 5  Current Medications: Scheduled Meds:   [MAR Hold] Chlorhexidine Gluconate Cloth  6 each Topical Daily   [MAR Hold] docusate  100 mg Per Tube BID   [MAR Hold] feeding supplement (OSMOLITE 1.5 CAL)  237 mL Per Tube QID   [MAR Hold] feeding supplement (PROSource TF)  45 mL Per Tube BID   [MAR Hold] gabapentin  600 mg Per Tube TID   [MAR Hold] heparin flush  10 Units Intracatheter Once   [MAR Hold] morphine CONCENTRATE  10 mg Per Tube Q6H   [MAR Hold] multivitamin  15 mL Per Tube Daily   mouth rinse  15 mL Mouth Rinse Q2H   [MAR Hold] pantoprazole  40 mg Per Tube Daily   [MAR Hold] pantoprazole (PROTONIX) IV  40 mg Intravenous Q24H   [MAR Hold] polyethylene glycol  17 g Per Tube Daily   [MAR Hold] QUEtiapine  100 mg Per Tube QHS   [MAR Hold] sodium chloride flush  10-40 mL Intracatheter Q12H   [MAR Hold] tranexamic acid  500 mg Nebulization Once   [MAR Hold] traZODone  100 mg Per Tube QHS    Continuous Infusions:  sodium chloride     [MAR Hold] ampicillin-sulbactam (UNASYN) IV 3 g (06/09/22 0518)   dexmedetomidine (PRECEDEX) IV infusion     [MAR Hold] fluconazole (DIFLUCAN) IV 200 mg  (06/08/22 2045)   lactated ringers 100 mL/hr at 06/08/22 2028   [MAR Hold] norepinephrine (LEVOPHED) Adult infusion      PRN Meds: [MAR Hold] acetaminophen (TYLENOL) oral liquid 160 mg/5 mL, [MAR Hold] acetaminophen **OR** [MAR Hold] acetaminophen, [MAR Hold] fentaNYL, [MAR Hold] fentaNYL (SUBLIMAZE) injection, [MAR Hold] fentaNYL (SUBLIMAZE) injection, heparin sodium (porcine), [MAR Hold]  HYDROmorphone (DILAUDID) injection, iodixanol, [MAR Hold] ondansetron **OR** [MAR Hold] ondansetron (ZOFRAN) IV, mouth rinse, [MAR Hold] oxyCODONE, [MAR Hold] sodium chloride, [MAR Hold] sodium chloride flush  Physical Exam Constitutional:      Comments: Returned from procedure with vascular surgery. Eyes closed.   Pulmonary:     Comments: On ventilator.  Skin:    General: Skin is warm and dry.             Vital Signs: BP (!) 140/90   Pulse 89   Temp 98.6 F (37 C) (Oral)   Resp 12   Ht '5\' 2"'$  (1.575 m)   Wt 58.4 kg   SpO2 100%   BMI 23.57 kg/m  SpO2: SpO2: 100 % O2 Device: O2 Device: Nasal Cannula O2 Flow Rate: O2 Flow Rate (L/min): 4 L/min  Intake/output summary:  Intake/Output Summary (Last 24 hours) at 06/09/2022 1118 Last data filed at 06/09/2022 0908 Gross per 24 hour  Intake 2417.86 ml  Output 20 ml  Net 2397.86 ml   LBM: Last BM Date : 06/08/22 Baseline Weight: Weight: 42 kg Most recent weight: Weight: 58.4 kg    Patient Active Problem List   Diagnosis Date Noted   Pressure injury of skin 06/07/2022   Head and neck cancer (HCC)    Hyponatremia 06/04/2022   SIRS (systemic inflammatory response syndrome) (HCC) 06/04/2022   Abnormal urinalysis 06/04/2022   Cancer associated pain 06/04/2022   Underweight 06/04/2022   Possible Seizure disorder ([place]) 05/16/2022   Bipolar 1 disorder ([place]) 05/16/2022   Tobacco abuse 05/16/2022  Throat cancer ([place]) 05/16/2022   Hypokalemia 05/16/2022   Protein-calorie malnutrition, severe 05/16/2022   Internal jugular vein thrombosis, 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  Recommend a time be arranged where the oncologist can E link into the patient's room and son can be at bedside. Son does state that their decisions moving forward would be heavily impacted by the guidance of [facility] oncology.  Code Status:    Code Status Orders  (From admission, onward)           Start     Ordered   06/04/22 0102  Full code  Continuous        06/04/22 0102           Code Status History     Date Active Date Inactive Code Status Order ID Comments User Context   05/16/2022 0202 05/19/2022 1800 Full Code [id number]  Mansy, Arvella Merles, MD ED      Advance Directive Documentation    Flowsheet Row Most Recent Value  Type of Advance Directive Healthcare Power of Attorney  Pre-existing out of facility DNR order (yellow form or pink MOST form) --  "MOST" Form in Place? --       Prognosis: Extremely poor   Care plan was discussed with CCM  Thank you for allowing the Palliative Medicine Team to assist in the care of this patient.   Asencion Gowda, NP  Please contact Palliative Medicine Team phone at [phone] for questions and concerns.

## 2022-06-09 NOTE — Progress Notes (Signed)
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  She presented in the ED in [place] where she presented with chest congestion, shortness of breath and hemoptysis.  CTA chest was negative for PE and admission was advised but patient left AMA. She presented in the [facility] ED 05/16/2022 with c/o: disorientation, hypertension.    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  Poor nutritional status.  Prognosis is very poor. - CT head 07/18 left retroauricular soft tissue mass better assessed on CT soft tissue neck. Patient was admitted for sepsis secondary to possible UTI vs neck mass, started on IV fluids and IV antibiotics. Infectious diseases consulted. Continuing IV antibiotics with Zosyn. Palliative care consulted to discuss goals of care: Patient wanting full code, full scope of treatment per their discussion 08/09. ENT also spoke w/ patient and son 08/09: If she remains here at [facility], and her lungs improve, and she and son wish to proceed, consider awake tracheostomy possibly next week - however high risk of bleeding / tumor extension complicating procedures that can be offered and high risk of pain/bleeding - concern patient and family are not comprehending severity of her situation. See ENT and palliative notes 08/09. Oncology following. Have also involved  vascular surgery to consider preventive embolization to reduce bleed risk. 08/11 hemoptysis overnight, taken this morning by vascular and ENT for embolization procedure and tracheostomy.  Since patient follows up with hem-onc at [facility] (family is hopeful for treatment there to be helpful), multiple attempts have been made to transfer the patient to [facility] but no bed available.  ENT, pulmonology, oncology and hospitalists all agree try for transfer to [facility] - hospitalist spoke to oncology team Dr Harriette Ohara at [facility] 06/06/2022 and reported they would accept patient when bed available.   Called [name] daily since, they are at capacity and cannot accept transfer.  Spoke with palliative 8/10, oncology palliative team will be reaching out to [facility] oncology to see if they can facilitate transfer   Consultants:  ID ENT Oncology Pulmonology Palliative ICU  Procedures: 08/11: vascular embolization, tracheostomy    Subjective: Patient unable to participate in interview at this time.    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  Lactic acid  0.8, UA : not significantly abnormal. Continue IV fluids as per sepsis protocol. 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 Harriette Ohara at [facility].  S/p chemo (Cisplatin). Recently contacted by radiation oncology Dr. Audelia Acton they are trying to reschedule steroid injection and simulation appointments,  she has missed prior appointments. Patient at high risk for erosion into vessels, Lemierre's syndrome, tumor extension into sigmoid sinus and cavernous sinus. Blood-tinged mucus is coming from her cancer.  Previous hospitalist spoke 06/06/2022 with oncologist Dr. Harriette Ohara at [facility], he agreed with the transfer but there is no bed available at this point ENT and pulmonology and oncology here have evaluated the patient and have recommended transfer to [facility] for further care. See notes 08/09. Palliative consult - full code. ENT consult - family will consider options, possible tracheostomy next week.  Confirmed with patient 08/10 that she wants to pursue tracheostomy. Oncology here will d/w oncology team at [facility] to see if they can facilitate transfer 08/11 overnight hemoptysis, urgent vascular embolization, tracheostomy, now in ICU and ventilated via trach   Possible seizure disorder : Continue gabapentin.   Cancer related pain/chronic pain: Continue oxycodone as needed   Hyponatremia: Resolved with IV hydration.   Tobacco abuse: Continue nicotine patch.   UTI - RULED OUT UA prn symptoms    Underweight: BMI 16.94  severe protein calorie malnutrition Nutritionist evaluation    DVT prophylaxis: SCD Code Status: FULL Family Communication: none at this time, specialists have spoken w/ fmaily  Disposition Plan / TOC needs: remains inpatient, unable to transfer to [facility] at this time  Barriers to discharge / significant pending items: bed availability at [facility]  / possible tracheostomy w/ ENT next week              Objective: Vitals:   06/09/22 0903 06/09/22 0904 06/09/22 0905 06/09/22 0906  BP:      Pulse:      Resp: 12 (!) '8 12 12  '$ Temp:      TempSrc:      SpO2:      Weight:      Height:        Intake/Output Summary (Last 24 hours) at 06/09/2022 1012 Last data filed at 06/09/2022 0908 Gross per 24 hour  Intake 2417.86 ml  Output 20 ml  Net 2397.86 ml    Filed Weights   06/04/22 0201 06/06/22 0450 06/08/22 0459  Weight: 80 kg 58.5 kg 58.4 kg    Examination:  Constitutional:  VS as above General Appearance: frail, NAD, on vent  Ears, Nose, Mouth, Throat, neck: Bandage on L neck Swelling in L face, tongue, neck  Respiratory: Fair respiratory effort Cardiovascular: S1/S2 normal Musculoskeletal:  No clubbing/cyanosis of digits        Scheduled Medications:   [MAR Hold] Chlorhexidine Gluconate Cloth  6 each Topical Daily   [MAR Hold] docusate  100 mg Per Tube BID   [MAR Hold] feeding supplement (OSMOLITE 1.5 CAL)  237 mL Per Tube QID   [MAR Hold] feeding supplement (PROSource TF)  45 mL Per Tube BID   [MAR Hold] gabapentin  600 mg Per Tube TID   [MAR Hold] heparin flush  10 Units  Intracatheter Once   [facility] Hold] morphine CONCENTRATE  10 mg Per Tube Q6H   [MAR Hold] multivitamin  15 mL Per Tube Daily   [MAR Hold] pantoprazole  40 mg Per Tube Daily   [MAR Hold] pantoprazole (PROTONIX) IV  40 mg Intravenous Q24H   [MAR Hold] polyethylene glycol  17 g Per Tube Daily   [MAR Hold] QUEtiapine  100 mg Per Tube QHS   [MAR Hold] sodium chloride flush  10-40 mL Intracatheter Q12H   [MAR Hold] tranexamic acid  500 mg Nebulization Once   [MAR Hold] traZODone  100 mg Per Tube QHS    Continuous Infusions:  sodium chloride     [MAR Hold] ampicillin-sulbactam (UNASYN) IV 3 g (06/09/22 0518)   dexmedetomidine (PRECEDEX) IV infusion     [MAR Hold] fluconazole (DIFLUCAN) IV 200 mg (06/08/22 2045)   lactated ringers 100  mL/hr at 06/08/22 2028   [MAR Hold] norepinephrine (LEVOPHED) Adult infusion      PRN Medications:  [MAR Hold] acetaminophen (TYLENOL) oral liquid 160 mg/5 mL, [MAR Hold] acetaminophen **OR** [MAR Hold] acetaminophen, [MAR Hold] fentaNYL, [MAR Hold] fentaNYL (SUBLIMAZE) injection, [MAR Hold] fentaNYL (SUBLIMAZE) injection, [MAR Hold]  HYDROmorphone (DILAUDID) injection, [MAR Hold] ondansetron **OR** [MAR Hold] ondansetron (ZOFRAN) IV, [MAR Hold] oxyCODONE, [MAR Hold] sodium chloride, [MAR Hold] sodium chloride flush  Antimicrobials:  Anti-infectives (From admission, onward)    Start     Dose/Rate Route Frequency Ordered Stop   06/09/22 0748  ceFAZolin (ANCEF) IVPB 2g/100 mL premix  Status:  Discontinued        2 g 200 mL/hr over 30 Minutes Intravenous 30 min pre-op 06/09/22 0748 06/09/22 0914   06/08/22 0600  [MAR Hold]  Ampicillin-Sulbactam (UNASYN) 3 g in sodium chloride 0.9 % 100 mL IVPB        (MAR Hold since Fri 06/09/2022 at 0957.Hold Reason: Transfer to a Procedural area)   3 g 200 mL/hr over 30 Minutes Intravenous Every 6 hours 06/07/22 1547     06/06/22 2200  [MAR Hold]  fluconazole (DIFLUCAN) IVPB 200 mg        (MAR Hold since Fri 06/09/2022 at 0957.Hold Reason: Transfer to a Procedural area)   200 mg 100 mL/hr over 60 Minutes Intravenous Every 24 hours 06/05/22 1622 06/13/22 2159   06/05/22 2200  piperacillin-tazobactam (ZOSYN) IVPB 3.375 g        3.375 g 12.5 mL/hr over 240 Minutes Intravenous Every 8 hours 06/05/22 1622 06/07/22 2359   06/05/22 1800  fluconazole (DIFLUCAN) IVPB 400 mg        400 mg 100 mL/hr over 120 Minutes Intravenous  Once 06/05/22 1622 06/07/22 0207   06/05/22 1545  Ampicillin-Sulbactam (UNASYN) 3 g in sodium chloride 0.9 % 100 mL IVPB  Status:  Discontinued        3 g 200 mL/hr over 30 Minutes Intravenous Every 6 hours 06/05/22 1450 06/05/22 1622   06/04/22 0115  cefTRIAXone (ROCEPHIN) 2 g in sodium chloride 0.9 % 100 mL IVPB  Status:  Discontinued         2 g 200 mL/hr over 30 Minutes Intravenous Every 24 hours 06/04/22 0102 06/05/22 1449   06/04/22 0045  cefTRIAXone (ROCEPHIN) 1 g in sodium chloride 0.9 % 100 mL IVPB  Status:  Discontinued        1 g 200 mL/hr over 30 Minutes Intravenous  Once 06/04/22 0030 06/04/22 0109       Data Reviewed: I have personally reviewed following labs and imaging studies  CBC: Recent Labs  Lab 06/04/22 0020 06/04/22 5400 06/05/22 0510 06/05/22 0831 06/06/22 0449 06/06/22 1715 06/08/22 0454 06/09/22 0701  WBC 20.3* 19.7* 24.6*  --  18.8*  --  15.9*  --   NEUTROABS 17.5*  --   --   --   --   --   --   --   HGB 9.8* 11.9* 7.9* 8.0* 7.7* 9.2* 9.5* 8.8*  HCT 29.9* 35.8* 24.7* 24.7* 24.7* 27.9* 29.0* 26.4*  MCV 96.5 96.0 98.4  --  100.4*  --  93.5  --   PLT 518* 471* 419*  --  367  --  294  --     Basic Metabolic Panel: Recent Labs  Lab 06/04/22 0852 06/05/22 0510 06/05/22 0510 06/05/22 1815 06/05/22 2154 06/06/22 0449 06/06/22 1715 06/08/22 0454 06/09/22 0701  NA 135 139  --   --   --  135  --  129* 131*  K 2.9* 4.5  --   --   --  5.0  --  3.6 3.5  CL 95* 100  --   --   --  100  --  92* 89*  CO2 27 30  --   --   --  29  --  30 33*  GLUCOSE 113* 112*  --   --   --  131*  --  131* 114*  BUN <5* 5*  --   --   --  9  --  7* 9  CREATININE 0.53 0.54  --   --   --  0.57  --  <0.30* 0.44  CALCIUM 9.3 8.6*  --   --   --  8.5*  --  8.3* 8.2*  MG  --  1.2*   < > 1.9 2.0 1.6* 1.6* 1.4*  --   PHOS  --  3.0   < > 1.8* 1.7* 2.0* 5.3* 2.5  --    < > = values in this interval not displayed.    GFR: Estimated Creatinine Clearance: 58.4 mL/min (by C-G formula based on SCr of 0.44 mg/dL). Liver Function Tests: Recent Labs  Lab 06/03/22 2125 06/06/22 0449 06/09/22 0701  AST '18 15 23  '$ ALT '9 9 16  '$ ALKPHOS 106 92 86  BILITOT 0.5 0.3 0.3  PROT 6.1* 5.6* 5.0*  ALBUMIN 2.5* 2.1* 1.7*    No results for input(s): "LIPASE", "AMYLASE" in the last 168 hours. No results for input(s): "AMMONIA" in  the last 168 hours. Coagulation Profile: Recent Labs  Lab 06/04/22 0020 06/09/22 0701  INR 1.1 1.0    Cardiac Enzymes: No results for input(s): "CKTOTAL", "CKMB", "CKMBINDEX", "TROPONINI" in the last 168 hours. BNP (last 3 results) No results for input(s): "PROBNP" in the last 8760 hours. HbA1C: No results for input(s): "HGBA1C" in the last 72 hours. CBG: Recent Labs  Lab 06/08/22 1632 06/08/22 1952 06/08/22 2358 06/09/22 0411 06/09/22 0654  GLUCAP 157* 154* 169* 143* 129*    Lipid Profile: No results for input(s): "CHOL", "HDL", "LDLCALC", "TRIG", "CHOLHDL", "LDLDIRECT" in the last 72 hours. Thyroid Function Tests: No results for input(s): "TSH", "T4TOTAL", "FREET4", "T3FREE", "THYROIDAB" in the last 72 hours. Anemia Panel: No results for input(s): "VITAMINB12", "FOLATE", "FERRITIN", "TIBC", "IRON", "RETICCTPCT" in the last 72 hours. Urine analysis:    Component Value Date/Time   COLORURINE YELLOW (A) 06/04/2022 0020   APPEARANCEUR CLEAR (A) 06/04/2022 0020   APPEARANCEUR Clear 08/31/2013 0312   LABSPEC 1.023 06/04/2022 0020   LABSPEC 1.003 08/31/2013 8676  PHURINE 8.0 06/04/2022 0020   GLUCOSEU NEGATIVE 06/04/2022 0020   GLUCOSEU Negative 08/31/2013 0312   HGBUR NEGATIVE 06/04/2022 0020   BILIRUBINUR NEGATIVE 06/04/2022 0020   BILIRUBINUR Negative 08/31/2013 0312   KETONESUR NEGATIVE 06/04/2022 0020   PROTEINUR NEGATIVE 06/04/2022 0020   NITRITE NEGATIVE 06/04/2022 0020   LEUKOCYTESUR SMALL (A) 06/04/2022 0020   LEUKOCYTESUR Negative 08/31/2013 0312   Sepsis Labs: '@LABRCNTIP'$ (procalcitonin:4,lacticidven:4)  Recent Results (from the past 240 hour(s))  Blood Culture (routine x 2)     Status: None   Collection Time: 06/03/22  9:24 PM   Specimen: BLOOD  Result Value Ref Range Status   Specimen Description BLOOD BLOOD LEFT FOREARM  Final   Special Requests   Final    BOTTLES DRAWN AEROBIC AND ANAEROBIC Blood Culture adequate volume   Culture   Final    NO  GROWTH 5 DAYS Performed at [facility], [address] [place], [place] [address]    Report Status 06/08/2022 FINAL  Final  Blood Culture (routine x 2)     Status: None   Collection Time: 06/03/22  9:26 PM   Specimen: BLOOD  Result Value Ref Range Status   Specimen Description BLOOD BLOOD RIGHT FOREARM  Final   Special Requests   Final    BOTTLES DRAWN AEROBIC AND ANAEROBIC Blood Culture results may not be optimal due to an inadequate volume of blood received in culture bottles   Culture   Final    NO GROWTH 5 DAYS Performed at [facility], [address] [place], [place] [address]    Report Status 06/08/2022 FINAL  Final  Urine Culture     Status: None   Collection Time: 06/04/22 12:20 AM   Specimen: Urine, Random  Result Value Ref Range Status   Specimen Description   Final    URINE, RANDOM Performed at [facility], [address] [place], [place] [address]    Special Requests   Final    NONE Performed at [facility], [address] [place], [place] [address]    Culture   Final    NO GROWTH Performed at [facility], [place] [address] [place], [place] [address]    Report Status 06/05/2022 FINAL  Final  SARS Coronavirus 2 by RT PCR (hospital order, performed in [facility] hospital lab) *cepheid single result test*     Status: None   Collection Time: 06/04/22 12:20 AM   Specimen: Nasal Swab  Result Value Ref Range Status   SARS Coronavirus 2 by RT PCR NEGATIVE NEGATIVE Final    Comment: (NOTE) SARS-CoV-2 target nucleic acids are NOT DETECTED.  The SARS-CoV-2 RNA is generally detectable in upper and lower respiratory specimens during the acute phase of infection. The lowest concentration of SARS-CoV-2 viral copies this assay can detect is 250 copies / mL. A negative result does not preclude SARS-CoV-2 infection and should not be used as the sole basis for treatment or other patient management decisions.  A negative  result may occur with improper specimen collection / handling, submission of specimen other than nasopharyngeal swab, presence of viral mutation(s) within the areas targeted by this assay, and inadequate number of viral copies (<250 copies / mL). A negative result must be combined with clinical observations, patient history, and epidemiological information.  Fact Sheet for Patients:   https://www.patel.info/  Fact Sheet for Healthcare Providers: https://hall.com/  This test is not yet approved or  cleared by the [place] FDA and has  been authorized for detection and/or diagnosis of SARS-CoV-2 by FDA under an Emergency Use Authorization (EUA).  This EUA will remain in effect (meaning this test can be used) for the duration of the COVID-19 declaration under Section 564(b)(1) of the Act, 21 U.S.C. section 360bbb-3(b)(1), unless the authorization is terminated or revoked sooner.  Performed at [facility], [address] [place],  [address]          Radiology Studies: CT Soft Tissue Neck W Contrast  Result Date: 06/04/2022 CLINICAL DATA:  Hematologic malignancy EXAM: CT NECK WITH CONTRAST TECHNIQUE: Multidetector CT imaging of the neck was performed using the standard protocol following the bolus administration of intravenous contrast. RADIATION DOSE REDUCTION: This exam was performed according to the departmental dose-optimization program which includes automated exposure control, adjustment of the mA and/or kV according to patient size and/or use of iterative reconstruction technique. CONTRAST:  13m OMNIPAQUE IOHEXOL 350 MG/ML SOLN COMPARISON:  05/15/2022 FINDINGS: PHARYNX AND LARYNX: Rightward deviation of the pharynx and upper larynx is unchanged. There is persistent left asymmetric soft tissue thickening at the larynx. The airway is maintained. Post treatment thickening of the epiglottis. Unchanged small  retropharyngeal effusion. SALIVARY GLANDS: Post treatment hyperenhancement of the salivary glands is unchanged. THYROID: Normal. LYMPH NODES: Conglomerate nodal mass at left level 2A with areas of central necrosis is unchanged allowing for differences in scan quality and contrast bolus timing. 5 mm right level 1A node is also unchanged. No new lymphadenopathy. VASCULAR: There is a right chest wall Port-A-Cath the terminates below the field of view. The left internal jugular vein remains occluded. There is narrowing of the distal left ICA due to mass effect by the surrounding soft tissues, unchanged. LIMITED INTRACRANIAL: Normal. VISUALIZED ORBITS: Normal. MASTOIDS AND VISUALIZED PARANASAL SINUSES: No fluid levels or advanced mucosal thickening. No mastoid effusion. SKELETON: No bony spinal canal stenosis. No lytic or blastic lesions. UPPER CHEST: Clear. OTHER: Unchanged cutaneous lesion of the left retroauricular region. IMPRESSION: 1. Unchanged appearance of confluent nodal mass at left level 2A with areas of central necrosis. 2. Unchanged 5 mm right level 1A node. 3. Unchanged cutaneous lesion of the left retroauricular region. 4. 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 Electronically Signed   By: KUlyses JarredM.D.   On: 06/04/2022 00:18   CT HEAD WO CONTRAST (5MM)  Result Date: 06/03/2022 CLINICAL DATA:  Altered mental status, head and neck carcinoma EXAM: CT HEAD WITHOUT CONTRAST TECHNIQUE: Contiguous axial images were obtained from the base of the skull through the vertex without intravenous contrast. RADIATION DOSE REDUCTION: This exam was performed according to the departmental dose-optimization program which includes automated exposure control, adjustment of the mA and/or kV according to patient size and/or use of iterative reconstruction technique. COMPARISON:  MRI 05/16/2022 FINDINGS: Brain: Normal anatomic configuration. Parenchymal volume loss is commensurate with the patient's age.  Mild periventricular white matter changes are present likely reflecting the sequela of small vessel ischemia. No abnormal intra or extra-axial mass lesion or fluid collection. No abnormal mass effect or midline shift. No evidence of acute intracranial hemorrhage or infarct. Ventricular size is normal. Cerebellum unremarkable. Vascular: No asymmetric hyperdense vasculature at the skull base. Skull: Intact Sinuses/Orbits: Paranasal sinuses are clear. Orbits are unremarkable. Other: Mastoid air cells and middle ear cavities are clear. Left retroauricular soft tissue mass is partially visualized at the inferior margin of the examination, better assessed on CT examination of the neck on 05/15/2022. IMPRESSION: 1. No acute intracranial abnormality. 2. Left  retroauricular soft tissue mass partially visualized, better assessed on CT examination of the neck on 05/15/2022. Electronically Signed   By: Fidela Salisbury M.D.   On: 06/03/2022 22:12   DG Chest Port 1 View  Result Date: 06/03/2022 CLINICAL DATA:  Questionable sepsis EXAM: PORTABLE CHEST 1 VIEW COMPARISON:  05/15/2022 FINDINGS: The heart size and mediastinal contours are within normal limits. Right chest port catheter. Both lungs are clear. The visualized skeletal structures are unremarkable. IMPRESSION: No acute abnormality of the lungs in AP portable projection. Electronically Signed   By: Delanna Ahmadi M.D.   On: 06/03/2022 21:20            LOS: 5 days        Emeterio Reeve, DO [facility] 06/09/2022, 10:12 AM   Staff may message me via secure chat in [place]  but this may not receive immediate response,  please page for urgent matters!  If 7PM-7AM, please contact night-coverage [email or web address]  Dictation software was used to generate the above note. Typos may occur and escape review, as with typed/written notes. Please contact Dr Sheppard Coil directly for clarity if needed.

## 2022-06-09 NOTE — Progress Notes (Addendum)
Pt is c/o nausea and vomiting, pt vomited clear with sputum content earlier, after 0100 pt has had 2 vomiting episodes with dark/black stomach contents. Sharion Settler, NP made aware. Per NP, we will monitor pt and a CBC will be ordered.   [id number] Pt is now vomiting bright red blood, NP Randol Kern made aware. [place] at bedside. Pt will be going to ICU.

## 2022-06-09 NOTE — Progress Notes (Signed)
Patient has had an eventful day, at 0800 Dr. Pryor Ochoa at bedside and examining patient, IV team also in and accessed porta cath., patient to transfer to OR for tracheostomy placement, report given to receiving staff, patient transferred with cardiac monitoring on hospital bed.  Patient returned at approximately 0915 and Fentanyl and Precedex initiated as ordered, patient then transferred to cath lab with this nurse and RT accompanying patient who is continues on vent, cardiac monitoring, and on bed.  This nurse and RT remained with patient during procedure, after completion of procedure patient transferred back to ICU in stable condition.  Upon return to ICU at approximately 11:30am foley ordered and placed by charge nurse.  Later in the shift labs were drawn by lab tech, received call from lab the blood had clotted and labs would be reordered for redraw, NP and MD updated.  PAD noted to have mild bleeding on last assessment and Dr. Lucky Cowboy aware and ok with slow removal of PAD, updated oncoming nurse as well.  Patient continues to be closely monitored, report given to oncoming nurse.

## 2022-06-09 NOTE — Transfer of Care (Signed)
Immediate Anesthesia Transfer of Care Note  Patient: Christina Porter  Procedure(s) Performed: AWAKE TRACHEOSTOMY  Patient Location: ICU  Anesthesia Type:General  Level of Consciousness: sedated  Airway & Oxygen Therapy: Patient placed on Ventilator (see vital sign flow sheet for setting)  Post-op Assessment: Report given to RN and Post -op Vital signs reviewed and stable  Post vital signs: Reviewed and stable  Last Vitals:  Vitals Value Taken Time  BP 136/79 06/09/22 0918  Temp    Pulse 107 06/09/22 0920  Resp 16 06/09/22 0920  SpO2 100   Vitals shown include unvalidated device data.  Last Pain:  Vitals:   06/09/22 0621  TempSrc: Oral  PainSc:       Patients Stated Pain Goal: 0 (33/35/45 6256)  Complications: No notable events documented.

## 2022-06-09 NOTE — Progress Notes (Signed)
Nutrition Follow Up Note   DOCUMENTATION CODES:   Underweight, Severe malnutrition in context of chronic illness  INTERVENTION:   Once appropriate for tube feeds, recommend:  Initiate Vital 1.2'@40ml' /hr continuous   Free water flushes 54m q4 hours to maintain tube patency   Regimen provides 1152kcal/day, 72g/day of protein and 9529mday of free water.   Liquid MVI daily via tube   Pt at high refeed risk; recommend monitor potassium, magnesium and phosphorus labs daily until stable  Juven Fruit Punch BID via tube, each serving provides 95kcal and 2.5g of protein (amino acids glutamine and arginine)  NUTRITION DIAGNOSIS:   Severe Malnutrition related to chronic illness (stage IV throat cancer) as evidenced by moderate fat depletion, severe fat depletion, moderate muscle depletion, severe muscle depletion. 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  Rapid response called this morning for respiratory distress secondary to recurrent bleeding. Pt required emergent tracheostomy and embolization with vascular today. Pt currently on ventilator. Tube feeds currently on hold. Pt vomiting blood this morning likely swallowed during bleed. Will resume tube feed once appropriate. Will change to continuous feeds while ventilated. Per chart, pt is up ~35lbs since admit; pt +11.0L on her I & Os.   Medications reviewed and include: colace, heparin, MVI, protonix, miralax, unasyn, diflucan, levophed, LRS  '@100ml' /hr   Labs reviewed: Na 131(L), K 3.5 wnl P 2.5 wnl, Mg 1.4(L)- 8/10 Wbc- 15.9(H), Hgb 8.8(L), Hct 26.4(L) Cbgs- 129, 143 x 24 hrs  Patient is currently intubated on ventilator support MV: 6.1 L/min Temp (24hrs), Avg:98.6 F (37 C), Min:97.6 F (36.4 C), Max:99.6 F (37.6 C)  Propofol: none   MAP- >6518m   Diet Order:   Diet Order             Diet NPO time specified  Diet effective now                  EDUCATION NEEDS:   Education needs have been addressed  Skin:  Skin Assessment: Reviewed RN Assessment (Stage II sacrum, incision neck)  Last BM:  8/10 -type 7  Height:   Ht Readings from Last 1 Encounters:  06/09/22 '5\' 2"'  (1.575 m)    Weight:   Wt Readings from Last 1 Encounters:  06/08/22 58.4 kg    Ideal Body Weight:  50 kg  BMI:  Body mass index is 23.57 kg/m.  Estimated Nutritional Needs:   Kcal:  1161kcal/day  Protein:  65-75g/day  Fluid:  1.3-1.5L/day  CasKoleen Distance, RD, LDN Please refer to [facility] and/or RD on-call/weekend/after hours pager

## 2022-06-09 NOTE — Progress Notes (Signed)
VAST consult to access port for OR. Right chest power port accessed per protocol as charted in flowsheets. Notified ICU RN that blood return is sluggish so she will probably have to be stuck for labs. Once she is stable and can go a few hours without using her port VAST can TPA it to improve blood return. ICU RN to notify VAST when appropriate.

## 2022-06-09 NOTE — Op Note (Signed)
OPERATIVE NOTE DATE: 06/09/2022  PROCEDURE:  Ultrasound guidance for vascular access right femoral artery Catheter placement into several external carotid artery branches and coil embolization of the first 3 external carotid artery branches in the main external carotid artery with a series of Ruby coils  Placement of an 8 mm diameter by 10 cm length Viabahn stent with the use of the NAV-6 embolic protection device in the left carotid artery  PRE-OPERATIVE DIAGNOSIS: 1.  Hemorrhage from left carotid artery 2.  Head neck cancer causing hemorrhage  POST-OPERATIVE DIAGNOSIS:  Same as above  SURGEON: Leotis Pain, MD  ASSISTANT(S): None  ANESTHESIA: local/MCS  ESTIMATED BLOOD LOSS: 10 cc  CONTRAST: 45 cc  FLUORO TIME: 8.9 minutes  Anesthesia: local  FINDING(S): 1.   Extrinsic compression of multiple external carotid artery branches and the internal carotid artery distal to the bifurcation  SPECIMEN(S):   none  INDICATIONS:   Patient is a 61 y.o. female who presents with pharyngeal head neck cancer with bleeding from a large fungating mass encasing her carotid artery artery causing thrombosis of the jugular vein.  She has already had an awake trach for airway protection risks and benefits were discussed and informed consent was obtained.   DESCRIPTION: After obtaining full informed written consent, the patient was brought back to the vascular suite and placed supine upon the table.  The patient received IV antibiotics prior to induction. Moderate conscious sedation was administered during a face to face encounter with the patient throughout the procedure with my supervision of the RN administering medicines and monitoring the patients vital signs and mental status throughout from the start of the procedure until the patient was taken to the recovery room.  After obtaining adequate anesthesia, the patient was prepped and draped in the standard fashion.   The right femoral artery was  visualized with ultrasound and found to be widely patent. It was then accessed under direct ultrasound guidance without difficulty with a Seldinger needle. A permanent image was recorded. A J-wire was placed and we then placed a 6 French sheath. The patient was then heparinized and a total of 5000 units of intravenous heparin were given. A pigtail catheter was then placed into the ascending aorta. This showed a bovine arch with no proximal stenosis. I then selectively cannulated the left common carotid artery without difficulty with a JB2 catheter and advanced into the mid left common carotid artery.  Cervical and cerebral carotid angiography was then performed. There were no obvious intracranial filling defects with normal intracranial flow. The carotid bifurcation demonstrated extrinsic compression of multiple external carotid artery branches from the tumor.  The internal carotid artery also appeared narrowed distal to the bifurcation from extrinsic compression.  I then advanced into the external carotid artery with a Glidewire and the JB2 catheter and then exchanged for the Amplatz Super Stiff wire. Over the Amplatz Super Stiff wire, a 6 [place] shuttle sheath was placed into the mid common carotid artery.  I then used a prograde microcatheter and selectively cannulated the first 3 external carotid artery branches sequentially and performed coil embolization of each branch back to the main external carotid artery.  The first coil was a 5 mm diameter by 30 cm length standard coil.  The next 3 were 4 mm diameter coils of various lengths between 10 and 20 cm.  And the final coil in the main external carotid artery was a 5 cm packing coil.  Completion imaging showed successful exclusion of the external  carotid artery to prevent hemorrhage.  I then used the NAV-6  Embolic protection device and crossed the lesion in the distal internal carotid artery and parked this in the distal internal carotid artery at the base of  the skull.  I then selected a 8 mm diameter by 10 cm length Viabahn covered stent to exclude the area and avoid hemorrhage from the tumor. This was deployed across the lesion encompassing it in its entirety.  This went back about 4 cm of the common carotid artery and up about 6 cm into the internal carotid artery.  Only about a 10% residual stenosis was present after angioplasty. Completion angiogram showed normal intracranial filling without new defects. At this point I elected to terminate the procedure. The sheath was removed and StarClose closure device was deployed in the right femoral artery with excellent hemostatic result. The patient was taken to the recovery room in stable condition having tolerated the procedure well.  COMPLICATIONS: none  CONDITION: stable  Leotis Pain 06/09/2022 11:37 AM   This note was created with Dragon Medical transcription system. Any errors in dictation are purely unintentional.

## 2022-06-09 NOTE — Progress Notes (Signed)
[id number] Patient emergently transferred to ICU from floor. For coughing up blood and possible need for intubation. Patient on arrival to room with increase work of breathing and blood in mouth. Patient alert and able to make conversation able to state name and birth date but confused about situation.  Vitals signs stable. Patient O2 100% on 4 liters. Awaiting ENT ( DR Vaught) for possible difficult airway.    35- Dr Pryor Ochoa at bedside  assessing patient and discussion plan of care with patient. Report given to [employer]

## 2022-06-09 NOTE — Progress Notes (Signed)
Patient to Vascular Lab for procedure accompanied by ICU nurse; Van Clines, RN and [facility], RT.  Van Clines, RN, remained present throughout entire procedure and managed the sedation via the patient's Fentanyl drip and Precedex drip.  RT team remained present throughout procedure managing ventilator needs and included Shannon Mabe, RT and [employer], RT.  Post procedure completion, patient returned to ICU with RN, RT, and transport escort.  See Vascular Lab Procedure documentation for details.

## 2022-06-09 NOTE — Consult Note (Signed)
[facility] for Electrolyte Monitoring and Replacement   Recent Labs: Potassium (mmol/L)  Date Value  06/09/2022 3.5  08/31/2013 3.7   Magnesium (mg/dL)  Date Value  06/08/2022 1.4 (L)   Calcium (mg/dL)  Date Value  06/09/2022 8.2 (L)   Calcium, Total (mg/dL)  Date Value  08/31/2013 8.8   Albumin (g/dL)  Date Value  06/09/2022 1.7 (L)  08/31/2013 3.9   Phosphorus (mg/dL)  Date Value  06/08/2022 2.5   Sodium (mmol/L)  Date Value  06/09/2022 131 (L)  08/31/2013 135 (L)   Assessment: Patient is a 61 y/o F with medical history including cancer of the soft palate with local metastasis on chronic opioids, 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 She underwent tracheostomy as well as coil embolization and stenting for hemorrhage from left carotid artery on 8/11. Pharmacy consulted to assist with electrolyte monitoring and replacement while patient is in the ICU.  Patient has a PEG tube MIVF: LR at 100 cc/hr I&O: + 11L  Goal of Therapy:  Electrolytes within normal limits  Plan:  --Hyponatremia & hypochloremia. Un-clear etiology. Patient is on fluids. Evaluate response --Follow-up electrolytes with AM labs tomorrow   Benita Gutter 06/09/2022 12:17 PM

## 2022-06-09 NOTE — Plan of Care (Signed)
Continuing with plan of care. 

## 2022-06-09 NOTE — Interval H&P Note (Signed)
History and Physical Interval Note:  06/09/2022 10:06 AM  Christina Porter  has presented today for surgery, with the diagnosis of Cancer of the soft palate.  The various methods of treatment have been discussed with the patient and family. After consideration of risks, benefits and other options for treatment, the patient has consented to  Procedure(s): CAROTID ANGIOGRAPHY (Left) as a surgical intervention.  The patient's history has been reviewed, patient examined, no change in status, stable for surgery.  I have reviewed the patient's chart and labs.  Questions were answered to the patient's satisfaction.     Leotis Pain

## 2022-06-09 NOTE — Consult Note (Signed)
NAME:  Christina Porter, MRN:  [id number], DOB:  [birth year], LOS: 5 ADMISSION DATE:  06/03/2022, CONSULTATION DATE:  06/09/2022 REFERRING MD:  Sharion Settler NP, CHIEF COMPLAINT:  Hemoptysis , Acute respiratory distress  Brief Pt Description / Synopsis:  61 y.o. 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  Developed Acute Respiratory Distress due to Hemoptysis requiring emergent Tracheostomy for airway protection, along with carotid artery embolization and carotid stent placement.  History of Present Illness:  Christina Porter is a 61 year old female with a past medical history significant for SCC of the soft palate on the left status post chemoradiation in May 2022 with recurrence and worsening of left neck and facial swelling, with tumor abutting on the left carotid and left IJ caused obstruction who presented to [facility] ED on 06/04/2022 due to shortness of breath and confusion.  Of note she was recently admitted to [facility] from 05/15/2022 through 05/19/2022 for left-sided facial swelling and neck swelling due to concern for septic thrombophlebitis of the left IJ.  She was treated with IV Unasyn and discharged home on p.o. Augmentin with plan for follow-up at [facility], with radiation starting on 05/30/2022.  She was seen in [facility] ED on 06/02/2022 for hypoxia and shortness of breath due to concern for aspiration pneumonia, unfortunately she left AMA.  She later presented to [facility] ED on the same day, but also left AMA.  She then returned to [facility] ED the following day on 06/03/2022 with noted tachycardia and tachypnea.  ED Course: Initial Vital Signs: Tmax 99.8, pulse 1 16-1 24, respirations 18-21 with initial BP 95/61 improving to 133/91 and O2 sat 98% on room  air Significant Labs: VBG with pH 7.49 and PCO2 46.  Lactic acid 0.7.  CBC pending.  CMP with sodium 133 and potassium 3.2.  CBC from [facility] showed a WBC of 11,300 and hemoglobin 9.7 and urinalysis with moderate leukocyte esterase.  Procalcitonin 1.13 Imaging Chest X-ray>>No acute abnormality CT head:>> IMPRESSION: 1. No acute intracranial abnormality 2. .Left retroauricular soft tissue mass better assessed on CT CT soft tissue neck with contrast>> IMPRESSION: 1. Unchanged appearance of confluent nodal mass at left level 2A with areas of central necrosis. 2. Unchanged 5 mm right level 1A node. 3. Unchanged cutaneous lesion of the left retroauricular region. 4. 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  Hospitalists were asked to admit for further workup and treatment.  Please see "significant hospital events" section below for full detailed hospital course.   Pertinent  Medical History   Past Medical History:  Diagnosis Date   Bipolar 1 disorder ([place])    Seizures ([place])     Micro Data:  8/5: Blood culture x2>> no growth 8/6: COVID-19 PCR>>negative 8/6: Urine >> no growth  Antimicrobials:  Ceftriaxone 8/6>>8/7 Unasyn 8/7>>8/7; restarted 8/10>> Fluconazole 8/8>>  Significant Hospital Events: Including procedures, antibiotic start and stop dates in addition to other pertinent events   8/6: Admitted by Hospitalist 8/7: ENT & ID consulted. 8/8: Oncology consulted. Concern for aspiration 8/9: Voice becoming hoarse. Palliative Care consulted 8/10: Some SOB noted. Vascular Surgery consulted. 8/11: Rapid response called due to recurrent bleeding with development of acute respiratory distress.  ENT performed emergent Tracheostomy, Vascular Surgery performed coil embolization of first 3 external  carotid artery branches and placed Left Carotid Stent.  PCCM consulted for vent management  Interim History / Subjective:  -Rapid response called due to recurrent bleeding  with development of acute respiratory distress.   -Seen in ICU just after transfer, pt is awake -ENT performed Trans-nasal flexible laryngoscopy showing large oropharyngeal mass and blood filling most of visible oropharynx and pharynx, Edematous epiglottis visible and larynx/supraglottis covered in bloody mucus  -ENT performed emergent Tracheostomy for airway protection -Vascular Surgery performed coil embolization of first 3 external carotid artery branches and placed Left Carotid Stent  Objective   Blood pressure 129/74, pulse (!) 0, temperature 98.6 F (37 C), temperature source Oral, resp. rate 17, height _0  (1.575 m), weight 58.4 kg, SpO2 100 %.    Vent Mode: PRVC FiO2 (%):  [40 %-50 %] 50 % Set Rate:  [16 bmp] 16 bmp Vt Set:  [400 mL] 400 mL PEEP:  [5 cmH20] 5 cmH20   Intake/Output Summary (Last 24 hours) at 06/09/2022 1238 Last data filed at 06/09/2022 4401 Gross per 24 hour  Intake 2417.86 ml  Output 20 ml  Net 2397.86 ml   Filed Weights   06/04/22 0201 06/06/22 0450 06/08/22 0459  Weight: 80 kg 58.5 kg 58.4 kg    Examination: General: Acute on chronically ill-appearing female, intubated and sedated status post tracheostomy, no acute distress HENT: Atraumatic, normocephalic, swelling noted to left neck (improved from arrival to ICU this morning) Lungs: Coarse breath sounds throughout, no wheezing or crackles noted, synchronous with the ventilator, even, nonlabored Cardiovascular: Regular rate and rhythm, S1-S2, no murmurs, rubs, gallops Abdomen: Soft, nontender, nondistended, no guarding rebound tenderness, bowel sounds positive x4 Extremities: No deformities, no edema Neuro: Sedated, withdraws to pain, pupils PERRLA GU: Foley catheter in place draining yellow urine  Resolved Hospital Problem list     Assessment & Plan:   Acute Hypoxic Respiratory Failure in the setting of extensive Oropharyngeal Cancer with Hemoptysis and Aspiration s/p emergent Tracheostomy  06/09/22 for airway protection -Full vent support, implement lung protective strategies -Plateau pressures less than 30 cm H20 -Wean FiO2 & PEEP as tolerated to maintain O2 sats >92% -Follow intermittent Chest X-ray & ABG as needed -Spontaneous Breathing Trials when respiratory parameters met and mental status permits -Implement VAP Bundle -Prn Bronchodilators -ENT following, appreciate input  Sepsis due to ? 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 with extension into left cervical lymph node with fungating mass Left IJ vein thrombosis -Oncology, ENT, Vascular Surgery following, appreciate input ~ recommend transfer to [facility] (unfortunately no bed availability) -Long term prognosis is extremely poor ~ Palliative Care is following  Acute Blood loss anemia due to Oropharyngeal cancer High risk for acute arterial bleeding from tumor erosion S/p embolization and Carotid stent placement -Monitor for S/Sx of bleeding -Trend CBC (H&H q6h) -SCD's for VTE Prophylaxis  -Transfuse for Hgb <8 -Vascular surgery following, appreciate input   Sedation needs in setting of mechanical ventilation PMHx: Chronic pain due to cancer -Maintain a RASS goal of 0 to -1 -Propofol and Fentanyl as needed to maintain RASS goal -Avoid sedating medications as able -Daily wake up assessment  Hyponatremia -Monitor I&O's / urinary output -Follow BMP -Ensure adequate renal perfusion -Avoid nephrotoxic agents as able -Replace electrolytes  as indicated -IV fluids     Pt is critically ill superimposed on severe oropharyngeal cancer.  Prognosis is extremely guarded, high risk for decompensation, cardiac arrest, and death.   Long term prognosis is exceedingly poor. Recommend DNR status.  Palliative Care is following for goals of care.   Best Practice (right click and "Reselect all SmartList Selections" daily)   Diet/type: NPO DVT prophylaxis: SCD GI prophylaxis: PPI Lines: Central line (Port-a-cath, present on admission) Foley:  Yes, and it is still needed Code Status:  full code Last date of multidisciplinary goals of care discussion [06/09/22]  8/11: Pt's son updated at bedside.  Labs   CBC: Recent Labs  Lab 06/04/22 0020 06/04/22 1856 06/05/22 0510 06/05/22 0831 06/06/22 0449 06/06/22 1715 06/08/22 0454 06/09/22 0701  WBC 20.3* 19.7* 24.6*  --  18.8*  --  15.9*  --   NEUTROABS 17.5*  --   --   --   --   --   --   --   HGB 9.8* 11.9* 7.9* 8.0* 7.7* 9.2* 9.5* 8.8*  HCT 29.9* 35.8* 24.7* 24.7* 24.7* 27.9* 29.0* 26.4*  MCV 96.5 96.0 98.4  --  100.4*  --  93.5  --   PLT 518* 471* 419*  --  367  --  294  --     Basic Metabolic Panel: Recent Labs  Lab 06/04/22 0852 06/05/22 0510 06/05/22 0510 06/05/22 1815 06/05/22 2154 06/06/22 0449 06/06/22 1715 06/08/22 0454 06/09/22 0701  NA 135 139  --   --   --  135  --  129* 131*  K 2.9* 4.5  --   --   --  5.0  --  3.6 3.5  CL 95* 100  --   --   --  100  --  92* 89*  CO2 27 30  --   --   --  29  --  30 33*  GLUCOSE 113* 112*  --   --   --  131*  --  131* 114*  BUN <5* 5*  --   --   --  9  --  7* 9  CREATININE 0.53 0.54  --   --   --  0.57  --  <0.30* 0.44  CALCIUM 9.3 8.6*  --   --   --  8.5*  --  8.3* 8.2*  MG  --  1.2*   < > 1.9 2.0 1.6* 1.6* 1.4*  --   PHOS  --  3.0   < > 1.8* 1.7* 2.0* 5.3* 2.5  --    < > = values in this interval not displayed.   GFR: Estimated Creatinine Clearance: 58.4 mL/min (by C-G formula based on SCr of 0.44 mg/dL). Recent Labs  Lab 06/03/22 2124 06/03/22 2125 06/04/22 0020 06/04/22 0020 06/04/22 0852 06/05/22 0510 06/06/22 0449 06/08/22 0454  PROCALCITON  --  1.13  --   --  0.91  --   --   --   WBC  --    --  20.3*   < > 19.7* 24.6* 18.8* 15.9*  LATICACIDVEN 0.7  --  0.8  --   --   --   --   --    < > = values in this interval not displayed.    Liver Function Tests: Recent Labs  Lab 06/03/22 2125 06/06/22 0449 06/09/22 0701  AST _0 ALT _1 ALKPHOS 106 92 86  BILITOT 0.5 0.3 0.3  PROT  6.1* 5.6* 5.0*  ALBUMIN 2.5* 2.1* 1.7*   No results for input(s): "LIPASE", "AMYLASE" in the last 168 hours. No results for input(s): "AMMONIA" in the last 168 hours.  ABG    Component Value Date/Time   PHART 7.53 (H) 06/09/2022 0918   PCO2ART 42 06/09/2022 0918   PO2ART 62 (L) 06/09/2022 0918   HCO3 35.1 (H) 06/09/2022 0918   O2SAT 96.6 06/09/2022 0918     Coagulation Profile: Recent Labs  Lab 06/04/22 0020 06/09/22 0701  INR 1.1 1.0    Cardiac Enzymes: No results for input(s): "CKTOTAL", "CKMB", "CKMBINDEX", "TROPONINI" in the last 168 hours.  HbA1C: No results found for: "HGBA1C"  CBG: Recent Labs  Lab 06/08/22 1952 06/08/22 2358 06/09/22 0411 06/09/22 0654 06/09/22 1140  GLUCAP 154* 169* 143* 129* 129*    Review of Systems:   Unable to assess due to sedation/Tracheostomy/AMS   Past Medical History:  She,  has a past medical history of Bipolar 1 disorder ([place]) and Seizures ([place]).   Surgical History:   Past Surgical History:  Procedure Laterality Date   CESAREAN SECTION       Social History:   reports that she has been smoking cigarettes. She has never used smokeless tobacco. She reports current alcohol use. She reports that she does not use drugs.   Family History:  Her Family history is unknown by patient.   Allergies Allergies  Allergen Reactions   Benadryl [Diphenhydramine] Other (See Comments)    Somnolence when given 25 mg IV; consider low dose administration     Home Medications  Prior to Admission medications   Medication Sig Start Date End Date Taking? Authorizing Provider  acetaminophen (TYLENOL) 325 MG tablet Take 2 tablets  (650 mg total) by mouth every 4 (four) hours as needed for mild pain (or Fever >/= 101). 05/19/22  Yes Dwyane Dee, MD  gabapentin (NEURONTIN) 250 MG/5ML solution Take 12 mLs by mouth in the morning, at noon, and at bedtime. 05/24/22  Yes [provider]  morphine (MS CONTIN) 15 MG 12 hr tablet Take 1 tablet (15 mg total) by mouth 3 (three) times daily. 05/19/22  Yes Dwyane Dee, MD  oxyCODONE (ROXICODONE) 5 MG immediate release tablet Take 1 tablet (5 mg total) by mouth every 4 (four) hours as needed. 05/19/22 05/19/23 Yes Dwyane Dee, MD  oxyCODONE (ROXICODONE) 5 MG/5ML solution Take 5-10 mLs by mouth every 4 (four) hours as needed. 05/23/22  Yes [provider]  polyethylene glycol (MIRALAX / GLYCOLAX) 17 g packet SMARTSIG:1 Packet(s) By Mouth PRN 05/23/22  Yes [provider]  QUEtiapine (SEROQUEL) 100 MG tablet Take 100 mg by mouth at bedtime. 02/13/22  Yes [provider]  traZODone (DESYREL) 100 MG tablet Take 100 mg by mouth at bedtime. 02/13/22  Yes [provider]  gabapentin (NEURONTIN) 300 MG capsule Take 2 capsules (600 mg total) by mouth 3 (three) times daily. Patient not taking: Reported on 06/04/2022 05/19/22   Dwyane Dee, MD  naloxone [facility]) nasal spray 4 mg/0.1 mL SMARTSIG:1 Both Nares Daily 05/24/22   [provider]     Critical care time: 50 minutes     Darel Hong, AGACNP-BC [facility] Prefer epic messenger for cross cover needs If after hours, please call E-link

## 2022-06-09 NOTE — Op Note (Signed)
..06/09/2022 8:47 AM  Christina Porter,  Christina Porter [id number]  Pre-Op Dx: Respiratory Distress, oropharyngeal carcinoma  Post-Op Dx:  same  Proc:  Emergent AwakeTracheostomy  Surg:  Christina Porter Christina Porter  Assist:  Christina Porter  Anes:  GOT  EBL:  50  Comp:  none  Findings:  Loss of airway shortly after being transferred to OR table.  Emergent tracheostomy tube placement preformed initially with 5.0 ETT and switched to 6.0 Shiley cuffed.  Procedure:  The patient was brought from the intensive care unit to the operating room and transferred to an operating table.  Versed Anesthesia was administered.  Limited Neck extension was achieved as possible and shoulder roll was placed.  The lower neck was palpated with the findings as described above.  1% Xylocaine with 1:100,000 epinephrine, 4 cc's, was infiltrated into the surgical field for intraoperative hemostasis.  The patient was prepped in a sterile fashion with a surgical prep from the chin down to the upper chest.  At this time anesthesia notified us of no air movement with mask bagging and patient's oxygen saturations dropped into the mid 40's.  A emergent 4 cm verticle incision was made at patient's midline.  Brisk bleeding was noted from the patient's left neck.  Dissection was carried with 15blade scalpel to the patient's fribrotic fascia overlying the thyroid gland.  A horizontal incision was made beneath the cricoid cartilage at the top of the thryoid isthmus and an 5.0 ETT was placed through the tracheostomy incision.  Significant bleeding coming from superiorly in the trachea was noted at this time.  Patient's saturations slowly climbed up and were back at 99% once the ETT was adjusted to prevent it from being main-stemmed.  At this time, attention was made for hemostasis and switching to tracheostomy tube placement.  The wound was next enlarged on the lateral aspects.  Additional dissection revealed the strap muscles.  The midline raphe was  divided in two layers and the muscles retracted laterally.  The pretracheal plane was visualized.  This was entered bluntly.  The thyroid isthmus was isolated and divided with the Harmonic scalpel.  The thyroid gland was retracted to either side.  The anterior face of the trachea was cleared.  A 6 mm wide inferiorly based flap was generated and secured to the lower wound with a 4-0 vicryl suture.   A previously tested  # 6 Shiley cuffed tracheostomy tube was brought into the field. The tracheostomy tube was inserted into the tracheal lumen.  Hemostasis was observed. The cuff was inflated and observed to be intact and containing pressure. The inner cannula was placed and ventilation assumed per tracheostomy tube.  Good chest wall motion was observed, and CO2 was documented per anesthesia.  The trach tube was secured in the standard fashion with trach ties. A 2-0 Nylon suture was used to secure the trach tube to the skin on both sides.  Hemostasis was observed again. At this point the procedure was completed.  The patient was returned to anesthesia, awakened as possible, and transferred back to the intensive care unit in stable condition.  Comment: 61 y.o. female with oropharyngeal bleeding and need for emergent airway was the indication for today's procedure.  Anticipate a routine postoperative recovery including standard tracheal hygiene.  The sutures should be removed in 5 days.  When the patient no longer requires ventilator or pressure support, the cuff should be deflated.  Changing to an uncuffed tube and downsizing will be according to the clinical condition of the patient.  Christina Porter Christina Porter  8:47 AM 06/09/2022

## 2022-06-09 NOTE — H&P (Signed)
..  History and Physical please see prior consult note for H&P

## 2022-06-10 ENCOUNTER — Inpatient Hospital Stay: Payer: Medicaid Other

## 2022-06-10 DIAGNOSIS — J690 Pneumonitis due to inhalation of food and vomit: Secondary | ICD-10-CM | POA: Diagnosis present

## 2022-06-10 LAB — BASIC METABOLIC PANEL
Anion gap: 8 (ref 5–15)
BUN: 7 mg/dL — ABNORMAL LOW (ref 8–23)
CO2: 29 mmol/L (ref 22–32)
Calcium: 7.9 mg/dL — ABNORMAL LOW (ref 8.9–10.3)
Chloride: 99 mmol/L (ref 98–111)
Creatinine, Ser: 0.51 mg/dL (ref 0.44–1.00)
GFR, Estimated: >60 mL/min (ref >60)
Glucose, Bld: 147 mg/dL — ABNORMAL HIGH (ref 70–99)
Potassium: 3.2 mmol/L — ABNORMAL LOW (ref 3.5–5.1)
Sodium: 136 mmol/L (ref 135–145)

## 2022-06-10 LAB — CBC
HCT: 26.3 % — ABNORMAL LOW (ref 36.0–46.0)
Hemoglobin: 8.7 g/dL — ABNORMAL LOW (ref 12.0–15.0)
MCH: 30.3 pg (ref 26.0–34.0)
MCHC: 33.1 g/dL (ref 30.0–36.0)
MCV: 91.6 fL (ref 80.0–100.0)
Platelets: 471 10*3/uL — ABNORMAL HIGH (ref 150–400)
RBC: 2.87 MIL/uL — ABNORMAL LOW (ref 3.87–5.11)
RDW: 13.7 % (ref 11.5–15.5)
WBC: 12.5 10*3/uL — ABNORMAL HIGH (ref 4.0–10.5)
nRBC: 0 % (ref 0.0–0.2)

## 2022-06-10 LAB — BLOOD GAS, ARTERIAL
Acid-Base Excess: 11.4 mmol/L — ABNORMAL HIGH (ref 0.0–2.0)
Bicarbonate: 33.4 mmol/L — ABNORMAL HIGH (ref 20.0–28.0)
FIO2: 35 %
MECHVT: 400 mL
Mechanical Rate: 16
O2 Saturation: 98.4 %
PEEP: 5 cmH2O
Patient temperature: 37
pCO2 arterial: 34 mmHg (ref 32–48)
pH, Arterial: 7.6 — ABNORMAL HIGH (ref 7.35–7.45)
pO2, Arterial: 78 mmHg — ABNORMAL LOW (ref 83–108)

## 2022-06-10 LAB — HEMOGLOBIN AND HEMATOCRIT, BLOOD
HCT: 27.2 % — ABNORMAL LOW (ref 36.0–46.0)
HCT: 22.5 % — ABNORMAL LOW (ref 36.0–46.0)
HCT: 22.3 % — ABNORMAL LOW (ref 36.0–46.0)
Hemoglobin: 9 g/dL — ABNORMAL LOW (ref 12.0–15.0)
Hemoglobin: 7.7 g/dL — ABNORMAL LOW (ref 12.0–15.0)
Hemoglobin: 7.4 g/dL — ABNORMAL LOW (ref 12.0–15.0)

## 2022-06-10 LAB — MAGNESIUM: Magnesium: 1.4 mg/dL — ABNORMAL LOW (ref 1.7–2.4)

## 2022-06-10 LAB — PHOSPHORUS: Phosphorus: 3.1 mg/dL (ref 2.5–4.6)

## 2022-06-10 LAB — GLUCOSE, CAPILLARY
Glucose-Capillary: 114 mg/dL — ABNORMAL HIGH (ref 70–99)
Glucose-Capillary: 119 mg/dL — ABNORMAL HIGH (ref 70–99)
Glucose-Capillary: 123 mg/dL — ABNORMAL HIGH (ref 70–99)
Glucose-Capillary: 118 mg/dL — ABNORMAL HIGH (ref 70–99)
Glucose-Capillary: 131 mg/dL — ABNORMAL HIGH (ref 70–99)

## 2022-06-10 LAB — TRIGLYCERIDES: Triglycerides: 131 mg/dL (ref <150)

## 2022-06-10 LAB — PREPARE RBC (CROSSMATCH)

## 2022-06-10 MED ORDER — MAGNESIUM SULFATE 4 GM/100ML IV SOLN
4.0000 g | Freq: Once | INTRAVENOUS | Status: AC
  Administered 2022-06-10: 4 g via INTRAVENOUS
  Filled 2022-06-10: qty 100

## 2022-06-10 MED ORDER — SODIUM CHLORIDE 0.9% IV SOLUTION: Freq: Once | INTRAVENOUS | Status: AC

## 2022-06-10 MED ORDER — POTASSIUM CHLORIDE 10 MEQ/50ML IV SOLN
10.0000 meq | INTRAVENOUS | Status: AC
  Administered 2022-06-10 (×4): 10 meq via INTRAVENOUS
  Filled 2022-06-10 (×4): qty 50

## 2022-06-10 MED ORDER — POTASSIUM CHLORIDE 20 MEQ PO PACK
40.0000 meq | PACK | Freq: Once | ORAL | Status: AC
  Administered 2022-06-10: 40 meq
  Filled 2022-06-10: qty 2

## 2022-06-10 MED ORDER — VITAL AF 1.2 CAL PO LIQD
1000.0000 mL | ORAL | Status: DC
  Administered 2022-06-10: 1000 mL

## 2022-06-10 NOTE — Progress Notes (Signed)
SLP Cancellation Note  Patient Details Name: Christina Porter MRN: [id number] DOB: [birth year]   Cancelled treatment:       Reason Eval/Treat Not Completed: Patient not medically ready. PMSV/Swallow orders received. Pt remains on the ventilator. Will follow for readiness.  Christina Porter, Christina Porter, Christina Porter    Aliene Altes 06/10/2022, 8:33 AM

## 2022-06-10 NOTE — Progress Notes (Signed)
  0720 patient alert mouthing words following all commands education on restraints and trach  0800 patient placed on breathing trial to see if we can get her off the vent sp02 decreased an work of breathing increased breathing trail stopped.   0900 patient disconnected self from vent education again on restraints and trach

## 2022-06-10 NOTE — Progress Notes (Signed)
eLink Physician-Brief Progress Note Patient Name: Christina Porter. Christina Porter DOB: 19-Jul-1961 MRN: [id number]   Date of Service  06/10/2022  HPI/Events of Note  eLINK- Hg follow up  Down to 7.4 Discussed with RN  eICU Interventions  Getting PRBC.  Continue care     Intervention Category Minor Interventions: Communication with other healthcare providers and/or family;Other: (anemia follow up per elick hand off)  Elmer Sow 06/10/2022, 10:49 PM

## 2022-06-10 NOTE — Progress Notes (Signed)
1 Day Post-Op   Subjective/Chief Complaint: No further bleeding overnight,on vasopressor support, remains on vent   Objective: Vital signs in last 24 hours: Temp:  [97.6 F (36.4 C)-98.5 F (36.9 C)] 98 F (36.7 C) (08/12 0736) Pulse Rate:  [0-115] 92 (08/12 0300) Resp:  [9-22] 18 (08/12 0700) BP: (77-160)/(54-95) 96/64 (08/12 0700) SpO2:  [96 %-100 %] 99 % (08/12 0400) FiO2 (%):  [35 %-50 %] 35 % (08/12 0400) Weight:  [45.7 kg] 45.7 kg (08/12 0500) Last BM Date : 06/09/22  Intake/Output from previous day: 08/11 0701 - 08/12 0700 In: 4335.9 [I.V.:2674; IV Piggyback:1071.9] Out: 3150 [Urine:3130; Blood:20] Intake/Output this shift: Total I/O In: -  Out: 400 [Urine:400]  General appearance: alert and mild distress Neck: LEFT neck dressing in place-clean, trached Extremities: RIGHT groin access site- C/D, soft Neurologic: Grossly normal, able to wiggle toes, +Bilateral grip equal  Lab Results:  Recent Labs    06/09/22 1629 06/09/22 2046 06/10/22 0201  WBC 16.4*  --  12.5*  HGB 11.3* 9.0* 8.7*  HCT 32.4* 26.7* 26.3*  PLT 463*  --  471*   BMET Recent Labs    06/09/22 0701 06/10/22 0201  NA 131* 136  K 3.5 3.2*  CL 89* 99  CO2 33* 29  GLUCOSE 114* 147*  BUN 9 7*  CREATININE 0.44 0.51  CALCIUM 8.2* 7.9*   PT/INR Recent Labs    06/09/22 0701  LABPROT 13.3  INR 1.0   ABG Recent Labs    06/09/22 0918 06/10/22 0500  PHART 7.53* 7.6*  HCO3 35.1* 33.4*    Studies/Results: DG Chest Port 1 View  Result Date: 06/10/2022 CLINICAL DATA:  [id number].  Hypoxic respiratory failure. EXAM: PORTABLE CHEST 1 VIEW COMPARISON:  Portable chest yesterday at 9:31 a.m. FINDINGS: 4:42 a.m. Tracheostomy cannula tip is 4.6 cm from the carina. Right IJ port catheter again terminates in the upper right atrium. Small or moderate right pleural effusion has increased from today's exam with increased overlying hazy consolidation or atelectasis in the right lower lung field. There is  unchanged hazy opacity in the left base. The remaining lungs are clear. The cardiac size and mediastinal silhouette are normal. In all other respects no further changes. IMPRESSION: 1. Worsening small or moderate right pleural effusion and overlying atelectasis or consolidation. 2. Stable hazy interstitial consolidation left base. Electronically Signed   By: Telford Nab M.D.   On: 06/10/2022 07:10   PERIPHERAL VASCULAR CATHETERIZATION  Result Date: 06/09/2022 See surgical note for result.  Portable Chest x-ray  Result Date: 06/09/2022 CLINICAL DATA:  Intubation EXAM: PORTABLE CHEST 1 VIEW COMPARISON:  06/09/2022 FINDINGS: Porta catheter on the right with tip at the upper cavoatrial junction. Tracheostomy tube in place. Hazy density at the bases with small right pleural effusion. Aeration is improved from prior. Stable heart size and mediastinal contours. IMPRESSION: Tracheostomy tube without complicating feature. Improved aeration at the right base. Electronically Signed   By: Jorje Guild M.D.   On: 06/09/2022 09:43    Anti-infectives: Anti-infectives (From admission, onward)    Start     Dose/Rate Route Frequency Ordered Stop   06/09/22 0748  ceFAZolin (ANCEF) IVPB 2g/100 mL premix  Status:  Discontinued        2 g 200 mL/hr over 30 Minutes Intravenous 30 min pre-op 06/09/22 0748 06/09/22 0914   06/08/22 0600  Ampicillin-Sulbactam (UNASYN) 3 g in sodium chloride 0.9 % 100 mL IVPB        3 g 200 mL/hr  over 30 Minutes Intravenous Every 6 hours 06/07/22 1547     06/06/22 2200  fluconazole (DIFLUCAN) IVPB 200 mg        200 mg 100 mL/hr over 60 Minutes Intravenous Every 24 hours 06/05/22 1622 06/13/22 2159   06/05/22 2200  piperacillin-tazobactam (ZOSYN) IVPB 3.375 g        3.375 g 12.5 mL/hr over 240 Minutes Intravenous Every 8 hours 06/05/22 1622 06/07/22 2359   06/05/22 1800  fluconazole (DIFLUCAN) IVPB 400 mg        400 mg 100 mL/hr over 120 Minutes Intravenous  Once 06/05/22 1622  06/07/22 0207   06/05/22 1545  Ampicillin-Sulbactam (UNASYN) 3 g in sodium chloride 0.9 % 100 mL IVPB  Status:  Discontinued        3 g 200 mL/hr over 30 Minutes Intravenous Every 6 hours 06/05/22 1450 06/05/22 1622   06/04/22 0115  cefTRIAXone (ROCEPHIN) 2 g in sodium chloride 0.9 % 100 mL IVPB  Status:  Discontinued        2 g 200 mL/hr over 30 Minutes Intravenous Every 24 hours 06/04/22 0102 06/05/22 1449   06/04/22 0045  cefTRIAXone (ROCEPHIN) 1 g in sodium chloride 0.9 % 100 mL IVPB  Status:  Discontinued        1 g 200 mL/hr over 30 Minutes Intravenous  Once 06/04/22 0030 06/04/22 0109       Assessment/Plan: s/p Procedure(s): CAROTID ANGIOGRAPHY (Left) POD #1 s/p ECA coiling and ICA Viabahn stenting for hemorrhaging Head and Neck Cancer Continue Neuro checks ASA/Plavix  Continue supportive care  LOS: 6 days    Jamesetta So A 06/10/2022

## 2022-06-10 NOTE — Consult Note (Signed)
NAME:  Christina Porter, MRN:  [id number], DOB:  03-Dec-1960, LOS: 6 ADMISSION DATE:  06/03/2022, CONSULTATION DATE:  06/09/2022 CHIEF COMPLAINT:  Hemoptysis , Acute respiratory distress  Brief Pt Description / Synopsis:  61 y.o. 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  Developed Acute Respiratory Distress due to Hemoptysis requiring emergent Tracheostomy for airway protection, along with carotid artery embolization and carotid stent placement.  History of Present Illness:  Christina Porter is a 61 year old female with a past medical history significant for SCC of the soft palate on the left status post chemoradiation in May 2022 with recurrence and worsening of left neck and facial swelling, with tumor abutting on the left carotid and left IJ caused obstruction who presented to [facility] ED on 06/04/2022 due to shortness of breath and confusion.  Of note she was recently admitted to [facility] from 05/15/2022 through 05/19/2022 for left-sided facial swelling and neck swelling due to concern for septic thrombophlebitis of the left IJ.  She was treated with IV Unasyn and discharged home on p.o. Augmentin with plan for follow-up at [facility], with radiation starting on 05/30/2022.  ED Course: Initial Vital Signs: Tmax 99.8, pulse 1 16-1 24, respirations 18-21 with initial BP 95/61 improving to 133/91 and O2 sat 98% on room air Significant Labs: VBG with pH 7.49 and PCO2 46.  Lactic acid 0.7.  CBC pending.  CMP with sodium 133 and potassium 3.2.  CBC from [facility] showed a WBC of 11,300 and hemoglobin 9.7 and urinalysis with moderate leukocyte esterase.  Procalcitonin 1.13 Imaging Chest X-ray>>No acute abnormality CT head:>> IMPRESSION: 1. No acute intracranial  abnormality 2. .Left retroauricular soft tissue mass better assessed on CT CT soft tissue neck with contrast>> IMPRESSION: 1. Unchanged appearance of confluent nodal mass at left level 2A with areas of central necrosis. 2. Unchanged 5 mm right level 1A node. 3. Unchanged cutaneous lesion of the left retroauricular region. 4. 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  Hospitalists were asked to admit for further workup and treatment.  Please see "significant hospital events" section below for full detailed hospital course.   Pertinent  Medical History   Past Medical History:  Diagnosis Date   Bipolar 1 disorder ([place])    Seizures ([place])     Micro Data:  8/5: Blood culture x2>> no growth 8/6: COVID-19 PCR>>negative 8/6: Urine >> no growth  Antimicrobials:  Ceftriaxone 8/6>>8/7 Unasyn 8/7>>8/7; restarted 8/10>> Fluconazole 8/8>>  Significant Hospital Events: Including procedures, antibiotic start and stop dates in addition to other pertinent events   8/6: Admitted by Hospitalist 8/7: ENT & ID consulted. 8/8: Oncology consulted. Concern for aspiration 8/9: Voice becoming hoarse. Palliative Care consulted 8/10: Some SOB noted. Vascular Surgery consulted. 8/11: Rapid response called due to recurrent bleeding with development of acute respiratory distress.  ENT performed emergent Tracheostomy, Vascular Surgery performed coil embolization of first 3 external carotid artery branches and placed Left Carotid Stent.  PCCM consulted for vent management 8/12 attempted SBT, failed secondary to tachypnea and hypoxia Interim History / Subjective:  -ENT performed Trans-nasal flexible laryngoscopy showing large oropharyngeal mass and blood filling most of visible oropharynx and pharynx, Edematous epiglottis visible and larynx/supraglottis covered in bloody mucus  -ENT performed emergent Tracheostomy for airway protection -  Vascular Surgery performed coil embolization of first 3  external carotid artery branches and placed Left Carotid Stent  Objective   Blood pressure (!) 97/58, pulse 95, temperature 98.2 F (36.8 C), temperature source Axillary, resp. rate 13, height '5\' 2"'$  (1.575 m), weight 45.7 kg, SpO2 100 %.    Vent Mode: PRVC FiO2 (%):  [35 %-50 %] 35 % Set Rate:  [12 bmp-16 bmp] 12 bmp Vt Set:  [400 mL] 400 mL PEEP:  [5 cmH20] 5 cmH20 Pressure Support:  [5 cmH20] 5 cmH20 Plateau Pressure:  [17 cmH20] 17 cmH20   Intake/Output Summary (Last 24 hours) at 06/10/2022 1430 Last data filed at 06/10/2022 1400 Gross per 24 hour  Intake 3133.46 ml  Output 3280 ml  Net -146.54 ml    Filed Weights   06/06/22 0450 06/08/22 0459 06/10/22 0500  Weight: 58.5 kg 58.4 kg 45.7 kg    Examination: General: Acute on chronically ill-appearing female, intubated status post tracheostomy, no acute distress HENT: Atraumatic, normocephalic, swelling noted to left neck. Shiley 6.0 with inner cannula in place. 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  On presentation, ENT was consulted and following a fiberoptic evaluation of her airway showing narrowing and blood mixed with mucus, she was emergently taken to the operating room where a tracheostomy tube was placed. Given that  she had multiple episodes of bleeding from her mass, she was taken by vascular surgery for embolization. Multiple branches of the external carotid artery were coil embolized and the internal carotid was stented.   She is currently mechanically ventilated with minimal ventilator settings through a Shiley 6.0 tracheostomy tube. Our goal will be to attempt to liberate her from the ventilator and an attempt at SBT this morning lead to an increase in her respiratory rate and in hypoxia. CXR showed a RLL infiltrate - on POCUS the RLL is consolidated with a trace pleural effusion associated with it. For that she will continue on antibiotics and we will initiate bronchial hygiene with a mechanical vibration device.  Should she tolerate trach collar, we will attempt a Passy-Muir valve. At this moment, she is not a candidate for decannulation given her extensive ENT tumor.  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  I personally spoke with her outpatient oncologist Dr. Harriette Ohara yesterday to discuss her condition.  He told me that she is enrolled in experimental protocol (nano particle infusion followed by targeted radiation therapy followed by immunotherapy with pembrolizumab) however she has yet to receive any treatment secondary to her current  hospitalization.  [facility] oncology has been managing her with palliative intent rather than curative.  At the moment, we will prioritize management of her airway and consider radiotherapy on Monday for tumor destruction/debulking. Should her condition improve/stabilize, we will  consider transfer to [facility] for further oncological care. I will continue to have goals of care discussions with her family (son) given her critical state.   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  Given extensive compression of her carotids by the mass, she also underwent stent placement in her internal carotid for which she will require dual antiplatelet therapy indefinitely.  Her hemodynamics have been well-maintained. Albeit she has required vasopressors on and off.  -Monitor for S/Sx of bleeding -Trend CBC (H&H q6h) -Transfuse for Hgb <8 -Vascular surgery following, appreciate input  -continue DAPT  #Aspiration Pneumonia #Sepsis  Does have a RLL infiltrate on CXR and a consolidated RLL on [place] with hepatization of the lung. Suspect aspiration of blood contents and associated aspiration pneumonia. She is currently on Unasyn with good response.  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  Prognosis is extremely guarded, high risk for decompensation, cardiac arrest, and death.  Long term prognosis is poor.   Best Practice (right click and "Reselect all SmartList Selections" daily)   Diet/type: NPO DVT prophylaxis: SCD GI prophylaxis: PPI Lines: Central line (Port-a-cath, present on admission) Foley:  Yes, and it is still needed Code Status:  full code Last date of multidisciplinary goals of care discussion [06/09/22]  Labs   CBC: Recent Labs  Lab 06/04/22 0020 06/04/22 0852 06/05/22 0510 06/05/22 0831 06/06/22 0449  06/06/22 1715 06/08/22 0454 06/09/22 0701 06/09/22 1629 06/09/22 2046 06/10/22 0201 06/10/22 0851  WBC 20.3*   < > 24.6*  --  18.8*  --  15.9*  --  16.4*  --  12.5*  --   NEUTROABS 17.5*  --   --   --   --   --   --   --   --   --   --   --   HGB 9.8*   < > 7.9*   < > 7.7*   < > 9.5* 8.8* 11.3* 9.0* 8.7* 9.0*  HCT 29.9*   < > 24.7*   < > 24.7*   < > 29.0* 26.4* 32.4* 26.7* 26.3* 27.2*  MCV 96.5   < > 98.4  --  100.4*  --  93.5  --  89.8  --  91.6  --   PLT 518*   < > 419*  --  367  --  294  --  463*  --  471*  --    < > = values in this interval not displayed.     Basic Metabolic Panel: Recent Labs  Lab 06/05/22 0510 06/05/22 1815 06/05/22 2154 06/06/22 0449 06/06/22 1715 06/08/22 0454 06/09/22 0701 06/10/22 0201  NA 139  --   --  135  --  129* 131* 136  K 4.5  --   --  5.0  --  3.6 3.5 3.2*  CL 100  --   --  100  --  92*  89* 99  CO2 30  --   --  29  --  30 33* 29  GLUCOSE 112*  --   --  131*  --  131* 114* 147*  BUN 5*  --   --  9  --  7* 9 7*  CREATININE 0.54  --   --  0.57  --  <0.30* 0.44 0.51  CALCIUM 8.6*  --   --  8.5*  --  8.3* 8.2* 7.9*  MG 1.2*   < > 2.0 1.6* 1.6* 1.4*  --  1.4*  PHOS 3.0   < > 1.7* 2.0* 5.3* 2.5  --  3.1   < > = values in this interval not displayed.    GFR: Estimated Creatinine Clearance: 53.3 mL/min (by C-G formula based on SCr of 0.51 mg/dL). Recent Labs  Lab 06/03/22 2124 06/03/22 2125 06/04/22 0020 06/04/22 0020 06/04/22 8756 06/05/22 0510 06/06/22 0449 06/08/22 0454 06/09/22 1629 06/10/22 0201  PROCALCITON  --  1.13  --   --  0.91  --   --   --   --   --   WBC  --   --  20.3*   < > 19.7*   < > 18.8* 15.9* 16.4* 12.5*  LATICACIDVEN 0.7  --  0.8  --   --   --   --   --   --   --    < > = values in this interval not displayed.     Liver Function Tests: Recent Labs  Lab 06/03/22 2125 06/06/22 0449 06/09/22 0701  AST '18 15 23  '$ ALT '9 9 16  '$ ALKPHOS 106 92 86  BILITOT 0.5 0.3 0.3  PROT 6.1* 5.6* 5.0*  ALBUMIN 2.5*  2.1* 1.7*    No results for input(s): "LIPASE", "AMYLASE" in the last 168 hours. No results for input(s): "AMMONIA" in the last 168 hours.  ABG    Component Value Date/Time   PHART 7.6 (H) 06/10/2022 0500   PCO2ART 34 06/10/2022 0500   PO2ART 78 (L) 06/10/2022 0500   HCO3 33.4 (H) 06/10/2022 0500   O2SAT 98.4 06/10/2022 0500     Coagulation Profile: Recent Labs  Lab 06/04/22 0020 06/09/22 0701  INR 1.1 1.0     Cardiac Enzymes: No results for input(s): "CKTOTAL", "CKMB", "CKMBINDEX", "TROPONINI" in the last 168 hours.  HbA1C: No results found for: "HGBA1C"  CBG: Recent Labs  Lab 06/09/22 1140 06/09/22 1934 06/09/22 2309 06/10/22 0815 06/10/22 1127  GLUCAP 129* 163* 146* 114* 119*     Review of Systems:   Unable to assess due to sedation/Tracheostomy/AMS   Past Medical History:  She,  has a past medical history of Bipolar 1 disorder ([place]) and Seizures ([place]).   Surgical History:   Past Surgical History:  Procedure Laterality Date   CAROTID ANGIOGRAPHY Left 06/09/2022   Procedure: CAROTID ANGIOGRAPHY;  Surgeon: Algernon Huxley, MD;  Location: [facility];  Service: Cardiovascular;  Laterality: Left;   CESAREAN SECTION     TRACHEOSTOMY TUBE PLACEMENT N/A 06/09/2022   Procedure: AWAKE TRACHEOSTOMY;  Surgeon: Carloyn Manner, MD;  Location: [facility] ORS;  Service: ENT;  Laterality: N/A;     Social History:   reports that she has been smoking cigarettes. She has never used smokeless tobacco. She reports current alcohol use. She reports that she does not use drugs.   Family History:  Her Family history is unknown by patient.   Allergies Allergies  Allergen Reactions  Benadryl [Diphenhydramine] Other (See Comments)    Somnolence when given 25 mg IV; consider low dose administration     Home Medications  Prior to Admission medications   Medication Sig Start Date End Date Taking? Authorizing Provider  acetaminophen (TYLENOL) 325 MG tablet Take 2  tablets (650 mg total) by mouth every 4 (four) hours as needed for mild pain (or Fever >/= 101). 05/19/22  Yes Dwyane Dee, MD  gabapentin (NEURONTIN) 250 MG/5ML solution Take 12 mLs by mouth in the morning, at noon, and at bedtime. 05/24/22  Yes [provider]  morphine (MS CONTIN) 15 MG 12 hr tablet Take 1 tablet (15 mg total) by mouth 3 (three) times daily. 05/19/22  Yes Dwyane Dee, MD  oxyCODONE (ROXICODONE) 5 MG immediate release tablet Take 1 tablet (5 mg total) by mouth every 4 (four) hours as needed. 05/19/22 05/19/23 Yes Dwyane Dee, MD  oxyCODONE (ROXICODONE) 5 MG/5ML solution Take 5-10 mLs by mouth every 4 (four) hours as needed. 05/23/22  Yes [provider]  polyethylene glycol (MIRALAX / GLYCOLAX) 17 g packet SMARTSIG:1 Packet(s) By Mouth PRN 05/23/22  Yes [provider]  QUEtiapine (SEROQUEL) 100 MG tablet Take 100 mg by mouth at bedtime. 02/13/22  Yes [provider]  traZODone (DESYREL) 100 MG tablet Take 100 mg by mouth at bedtime. 02/13/22  Yes [provider]  gabapentin (NEURONTIN) 300 MG capsule Take 2 capsules (600 mg total) by mouth 3 (three) times daily. Patient not taking: Reported on 06/04/2022 05/19/22   Dwyane Dee, MD  naloxone [facility]) nasal spray 4 mg/0.1 mL SMARTSIG:1 Both Nares Daily 05/24/22   [provider]     Critical care time: 81 minutes    Armando Reichert, MD [facility]

## 2022-06-10 NOTE — Plan of Care (Signed)
  Problem: Nutrition: Goal: Adequate nutrition will be maintained Outcome: Progressing Note: Started Tube feedings   Problem: Fluid Volume: Goal: Hemodynamic stability will improve Outcome: Not Progressing   Problem: Clinical Measurements: Goal: Diagnostic test results will improve Outcome: Not Progressing Goal: Signs and symptoms of infection will decrease Outcome: Not Progressing   Problem: Respiratory: Goal: Ability to maintain adequate ventilation will improve Outcome: Not Progressing   Problem: Education: Goal: Knowledge of General Education information will improve Description: Including pain rating scale, medication(s)/side effects and non-pharmacologic comfort measures Outcome: Not Progressing   Problem: Health Behavior/Discharge Planning: Goal: Ability to manage health-related needs will improve Outcome: Not Progressing   Problem: Clinical Measurements: Goal: Ability to maintain clinical measurements within normal limits will improve Outcome: Not Progressing Goal: Will remain free from infection Outcome: Not Progressing Goal: Diagnostic test results will improve Outcome: Not Progressing Goal: Respiratory complications will improve Outcome: Not Progressing Goal: Cardiovascular complication will be avoided Outcome: Not Progressing   Problem: Activity: Goal: Risk for activity intolerance will decrease Outcome: Not Progressing   Problem: Coping: Goal: Level of anxiety will decrease Outcome: Not Progressing   Problem: Elimination: Goal: Will not experience complications related to bowel motility Outcome: Not Progressing Goal: Will not experience complications related to urinary retention Outcome: Not Progressing   Problem: Pain Managment: Goal: General experience of comfort will improve Outcome: Not Progressing   Problem: Safety: Goal: Ability to remain free from injury will improve Outcome: Not Progressing   Problem: Skin Integrity: Goal: Risk for  impaired skin integrity will decrease Outcome: Not Progressing   Problem: Activity: Goal: Ability to tolerate increased activity will improve Outcome: Not Progressing   Problem: Respiratory: Goal: Ability to maintain a clear airway and adequate ventilation will improve Outcome: Not Progressing   Problem: Role Relationship: Goal: Method of communication will improve Outcome: Not Progressing   Problem: Safety: Goal: Non-violent Restraint(s) Outcome: Not Progressing

## 2022-06-10 NOTE — Consult Note (Signed)
[place] for Electrolyte Monitoring and Replacement   Recent Labs: Potassium (mmol/L)  Date Value  06/10/2022 3.2 (L)  08/31/2013 3.7   Magnesium (mg/dL)  Date Value  06/10/2022 1.4 (L)   Calcium (mg/dL)  Date Value  06/10/2022 7.9 (L)   Calcium, Total (mg/dL)  Date Value  08/31/2013 8.8   Albumin (g/dL)  Date Value  06/09/2022 1.7 (L)  08/31/2013 3.9   Phosphorus (mg/dL)  Date Value  06/10/2022 3.1   Sodium (mmol/L)  Date Value  06/10/2022 136  08/31/2013 135 (L)   Assessment: Patient is a 61 y/o F with medical history including cancer of the soft palate with local metastasis on chronic opioids, 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 She underwent tracheostomy as well as coil embolization and stenting for hemorrhage from left carotid artery on 8/11. Pharmacy consulted to assist with electrolyte monitoring and replacement while patient is in the ICU.  Patient has a PEG tube MIVF: LR at 100 cc/hr I&O: + 11L  Goal of Therapy:  Electrolytes within normal limits  Plan:  --K 3.2   KCL 10 meq IV x 4 and KCL 40 meq PO ordered by NP -Mag 1.4   Magnesium 4 gm IV x 1 ordered by NP --Follow-up electrolytes with AM labs tomorrow   Christina Porter A 06/10/2022 10:33 AM

## 2022-06-10 NOTE — Anesthesia Postprocedure Evaluation (Signed)
Anesthesia Post Note  Patient: Christina Porter  Procedure(s) Performed: AWAKE TRACHEOSTOMY CAROTID ANGIOGRAPHY (Left)  Patient location during evaluation: SICU Anesthesia Type: General Level of consciousness: awake Pain management: pain level controlled Vital Signs Assessment: post-procedure vital signs reviewed and stable Respiratory status: patient on ventilator - see flowsheet for VS Cardiovascular status: stable Postop Assessment: no apparent nausea or vomiting Anesthetic complications: no Comments: Patient on trach. Stable and saturating well.   No notable events documented.   Last Vitals:  Vitals:   06/10/22 0915 06/10/22 0930  BP: 99/62 (!) 104/59  Pulse: (!) 104 (!) 107  Resp: 17 14  Temp:    SpO2:      Last Pain:  Vitals:   06/10/22 0736  TempSrc: Axillary  PainSc:                  Dimas Millin

## 2022-06-11 DIAGNOSIS — R4182 Altered mental status, unspecified: Secondary | ICD-10-CM

## 2022-06-11 LAB — BASIC METABOLIC PANEL
Anion gap: 9 (ref 5–15)
BUN: 7 mg/dL — ABNORMAL LOW (ref 8–23)
CO2: 27 mmol/L (ref 22–32)
Calcium: 7.9 mg/dL — ABNORMAL LOW (ref 8.9–10.3)
Chloride: 103 mmol/L (ref 98–111)
Creatinine, Ser: 0.6 mg/dL (ref 0.44–1.00)
GFR, Estimated: >60 mL/min (ref >60)
Glucose, Bld: 118 mg/dL — ABNORMAL HIGH (ref 70–99)
Potassium: 3.3 mmol/L — ABNORMAL LOW (ref 3.5–5.1)
Sodium: 139 mmol/L (ref 135–145)

## 2022-06-11 LAB — TYPE AND SCREEN
ABO/RH(D): B POS
Antibody Screen: NEGATIVE
Unit division: 0

## 2022-06-11 LAB — MAGNESIUM: Magnesium: 2 mg/dL (ref 1.7–2.4)

## 2022-06-11 LAB — HEMOGLOBIN AND HEMATOCRIT, BLOOD
HCT: 30.4 % — ABNORMAL LOW (ref 36.0–46.0)
Hemoglobin: 10.3 g/dL — ABNORMAL LOW (ref 12.0–15.0)

## 2022-06-11 LAB — CBC
HCT: 29.5 % — ABNORMAL LOW (ref 36.0–46.0)
Hemoglobin: 10 g/dL — ABNORMAL LOW (ref 12.0–15.0)
MCH: 30.4 pg (ref 26.0–34.0)
MCHC: 33.9 g/dL (ref 30.0–36.0)
MCV: 89.7 fL (ref 80.0–100.0)
Platelets: 401 10*3/uL — ABNORMAL HIGH (ref 150–400)
RBC: 3.29 MIL/uL — ABNORMAL LOW (ref 3.87–5.11)
RDW: 13.4 % (ref 11.5–15.5)
WBC: 11.9 10*3/uL — ABNORMAL HIGH (ref 4.0–10.5)
nRBC: 0 % (ref 0.0–0.2)

## 2022-06-11 LAB — PHOSPHORUS: Phosphorus: 2.5 mg/dL (ref 2.5–4.6)

## 2022-06-11 LAB — BPAM RBC
Blood Product Expiration Date: 202309042359
ISSUE DATE / TIME: 202308122231
Unit Type and Rh: 7300

## 2022-06-11 LAB — GLUCOSE, CAPILLARY
Glucose-Capillary: 122 mg/dL — ABNORMAL HIGH (ref 70–99)
Glucose-Capillary: 112 mg/dL — ABNORMAL HIGH (ref 70–99)
Glucose-Capillary: 112 mg/dL — ABNORMAL HIGH (ref 70–99)
Glucose-Capillary: 122 mg/dL — ABNORMAL HIGH (ref 70–99)
Glucose-Capillary: 119 mg/dL — ABNORMAL HIGH (ref 70–99)

## 2022-06-11 MED ORDER — POTASSIUM CHLORIDE 10 MEQ/50ML IV SOLN
10.0000 meq | INTRAVENOUS | Status: AC
  Administered 2022-06-11 (×4): 10 meq via INTRAVENOUS
  Filled 2022-06-11 (×4): qty 50

## 2022-06-11 MED ORDER — ORAL CARE MOUTH RINSE
15.0000 mL | OROMUCOSAL | Status: DC
  Administered 2022-06-11 – 2022-06-19 (×14): 15 mL via OROMUCOSAL

## 2022-06-11 MED ORDER — POTASSIUM CHLORIDE 20 MEQ PO PACK
40.0000 meq | PACK | Freq: Once | ORAL | Status: AC
  Administered 2022-06-11: 40 meq
  Filled 2022-06-11: qty 2

## 2022-06-11 NOTE — Progress Notes (Signed)
2 Days Post-Op   Subjective/Chief Complaint: Transfused 1U pRBC, no evidence of continued bleeding, now on Trach Collar.   Objective: Vital signs in last 24 hours: Temp:  [98.1 F (36.7 C)-99.2 F (37.3 C)] 99.2 F (37.3 C) (08/13 0350) Pulse Rate:  [81-126] 100 (08/13 0600) Resp:  [12-30] 16 (08/13 0600) BP: (67-132)/(36-89) 112/66 (08/13 0600) SpO2:  [98 %-100 %] 100 % (08/13 0034) FiO2 (%):  [35 %] 35 % (08/13 0034) Weight:  [43.9 kg] 43.9 kg (08/13 0500) Last BM Date : 06/09/22  Intake/Output from previous day: 08/12 0701 - 08/13 0700 In: 2506 [I.V.:675.3; Blood:320; NG/GT:746; IV Piggyback:444.7] Out: 2450 [Urine:2450] Intake/Output this shift: No intake/output data recorded.  General appearance: alert and no distress Neck: Trach in place, bandage over LEFT neck Extremities: warm, access site, C/D Neurologic: Grossly normal  Lab Results:  Recent Labs    06/10/22 0201 06/10/22 0851 06/10/22 2026 06/11/22 0208  WBC 12.5*  --   --  11.9*  HGB 8.7*   < > 7.4* 10.0*  HCT 26.3*   < > 22.3* 29.5*  PLT 471*  --   --  401*   < > = values in this interval not displayed.   BMET Recent Labs    06/10/22 0201 06/11/22 0208  NA 136 139  K 3.2* 3.3*  CL 99 103  CO2 29 27  GLUCOSE 147* 118*  BUN 7* 7*  CREATININE 0.51 0.60  CALCIUM 7.9* 7.9*   PT/INR Recent Labs    06/09/22 0701  LABPROT 13.3  INR 1.0   ABG Recent Labs    06/09/22 0918 06/10/22 0500  PHART 7.53* 7.6*  HCO3 35.1* 33.4*    Studies/Results: DG Chest Port 1 View  Result Date: 06/10/2022 CLINICAL DATA:  [id number].  Hypoxic respiratory failure. EXAM: PORTABLE CHEST 1 VIEW COMPARISON:  Portable chest yesterday at 9:31 a.m. FINDINGS: 4:42 a.m. Tracheostomy cannula tip is 4.6 cm from the carina. Right IJ port catheter again terminates in the upper right atrium. Small or moderate right pleural effusion has increased from today's exam with increased overlying hazy consolidation or atelectasis in  the right lower lung field. There is unchanged hazy opacity in the left base. The remaining lungs are clear. The cardiac size and mediastinal silhouette are normal. In all other respects no further changes. IMPRESSION: 1. Worsening small or moderate right pleural effusion and overlying atelectasis or consolidation. 2. Stable hazy interstitial consolidation left base. Electronically Signed   By: Telford Nab M.D.   On: 06/10/2022 07:10   PERIPHERAL VASCULAR CATHETERIZATION  Result Date: 06/09/2022 See surgical note for result.  Portable Chest x-ray  Result Date: 06/09/2022 CLINICAL DATA:  Intubation EXAM: PORTABLE CHEST 1 VIEW COMPARISON:  06/09/2022 FINDINGS: Porta catheter on the right with tip at the upper cavoatrial junction. Tracheostomy tube in place. Hazy density at the bases with small right pleural effusion. Aeration is improved from prior. Stable heart size and mediastinal contours. IMPRESSION: Tracheostomy tube without complicating feature. Improved aeration at the right base. Electronically Signed   By: Jorje Guild M.D.   On: 06/09/2022 09:43    Anti-infectives: Anti-infectives (From admission, onward)    Start     Dose/Rate Route Frequency Ordered Stop   06/09/22 0748  ceFAZolin (ANCEF) IVPB 2g/100 mL premix  Status:  Discontinued        2 g 200 mL/hr over 30 Minutes Intravenous 30 min pre-op 06/09/22 0748 06/09/22 0914   06/08/22 0600  Ampicillin-Sulbactam (UNASYN) 3 g in  sodium chloride 0.9 % 100 mL IVPB        3 g 200 mL/hr over 30 Minutes Intravenous Every 6 hours 06/07/22 1547     06/06/22 2200  fluconazole (DIFLUCAN) IVPB 200 mg  Status:  Discontinued        200 mg 100 mL/hr over 60 Minutes Intravenous Every 24 hours 06/05/22 1622 06/10/22 1506   06/05/22 2200  piperacillin-tazobactam (ZOSYN) IVPB 3.375 g        3.375 g 12.5 mL/hr over 240 Minutes Intravenous Every 8 hours 06/05/22 1622 06/07/22 2359   06/05/22 1800  fluconazole (DIFLUCAN) IVPB 400 mg        400  mg 100 mL/hr over 120 Minutes Intravenous  Once 06/05/22 1622 06/07/22 0207   06/05/22 1545  Ampicillin-Sulbactam (UNASYN) 3 g in sodium chloride 0.9 % 100 mL IVPB  Status:  Discontinued        3 g 200 mL/hr over 30 Minutes Intravenous Every 6 hours 06/05/22 1450 06/05/22 1622   06/04/22 0115  cefTRIAXone (ROCEPHIN) 2 g in sodium chloride 0.9 % 100 mL IVPB  Status:  Discontinued        2 g 200 mL/hr over 30 Minutes Intravenous Every 24 hours 06/04/22 0102 06/05/22 1449   06/04/22 0045  cefTRIAXone (ROCEPHIN) 1 g in sodium chloride 0.9 % 100 mL IVPB  Status:  Discontinued        1 g 200 mL/hr over 30 Minutes Intravenous  Once 06/04/22 0030 06/04/22 0109       Assessment/Plan: s/p Procedure(s): CAROTID ANGIOGRAPHY (Left) POD #2 s/p ECA coiling and ICA Viabahn stenting for hemorrhaging Head and Neck Cancer Continue Neuro checks ASA/Plavix  LOS: 7 days    Jamesetta So A 06/11/2022

## 2022-06-11 NOTE — Progress Notes (Signed)
SLP Cancellation Note  Patient Details Name: Christina Porter MRN: [id number] DOB: September 28, 1961   Cancelled treatment:       Reason Eval/Treat Not Completed: Patient not medically ready. Pt continues to require ventialtor support. Will continue efforts as appropriate.  Cherrie Gauze, M.S., [facility] [phone] Christina Porter)  Christina Porter 06/11/2022, 8:37 AM

## 2022-06-11 NOTE — Progress Notes (Signed)
NAME:  Christina Porter, MRN:  [id number], DOB:  01-07-1961, LOS: 7 ADMISSION DATE:  06/03/2022, CONSULTATION DATE:  06/09/2022 REFERRING MD:  Sharion Settler NP, CHIEF COMPLAINT:  Hemoptysis , Acute respiratory distress  Brief Pt Description / Synopsis:  61 y.o. 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  Developed Acute Respiratory Distress due to Hemoptysis requiring emergent Tracheostomy for airway protection, along with carotid artery embolization and carotid stent placement.  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  Of note she was recently admitted to [facility] from 05/15/2022 through 05/19/2022 for left-sided facial swelling and neck swelling due to concern for septic thrombophlebitis of the left IJ.  She was treated with IV Unasyn and discharged home on p.o. Augmentin with plan for follow-up at [facility], with radiation starting on 05/30/2022.  She was seen in [facility] ED on 06/02/2022 for hypoxia and shortness of breath due to concern for aspiration pneumonia, unfortunately she left AMA.  She later presented to [facility] ED on the same day, but also left AMA.  She then returned to [facility] ED the following day on 06/03/2022 with noted tachycardia and tachypnea.  ED Course: Initial Vital Signs: Tmax 99.8, pulse 1 16-1 24, respirations 18-21 with initial BP 95/61 improving to 133/91 and O2 sat 98% on room  air Significant Labs: VBG with pH 7.49 and PCO2 46.  Lactic acid 0.7.  CBC pending.  CMP with sodium 133 and potassium 3.2.  CBC from [facility] showed a WBC of 11,300 and hemoglobin 9.7 and urinalysis with moderate leukocyte esterase.  Procalcitonin 1.13 Imaging Chest X-ray>>No acute abnormality CT head:>> IMPRESSION: 1. No acute intracranial abnormality 2. .Left retroauricular soft tissue mass better assessed on CT CT soft tissue neck with contrast>> IMPRESSION: 1. Unchanged appearance of confluent nodal mass at left level 2A with areas of central necrosis. 2. Unchanged 5 mm right level 1A node. 3. Unchanged cutaneous lesion of the left retroauricular region. 4. 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  Hospitalists were asked to admit for further workup and treatment.  Please see "significant hospital events" section below for full detailed hospital course.   Pertinent  Medical History   Past Medical History:  Diagnosis Date   Bipolar 1 disorder ([place])    Seizures ([place])     Micro Data:  8/5: Blood culture x2>> no growth 8/6: COVID-19 PCR>>negative 8/6: Urine >> no growth  Antimicrobials:  Ceftriaxone 8/6>>8/7 Unasyn 8/7>>8/7; restarted 8/10>> Fluconazole 8/8>>  Significant Hospital Events: Including procedures, antibiotic start and stop dates in addition to other pertinent events   8/6: Admitted by Hospitalist 8/7: ENT & ID consulted. 8/8: Oncology consulted. Concern for aspiration 8/9: Voice becoming hoarse. Palliative Care consulted 8/10: Some SOB noted. Vascular Surgery consulted. 8/11: Rapid response called due to recurrent bleeding with development of acute respiratory distress.  ENT performed emergent Tracheostomy, Vascular Surgery performed coil embolization of first 3 external  carotid artery branches and placed Left Carotid Stent.  PCCM consulted for vent management 8/12 attempted SBT, failed initial attempt secondary to tachypnea and hypoxia.   Later able to tolerate TC  8/13: Received 1 unit pRBC's overnight. Tolerating TC trials  Interim History / Subjective:  -No significant events overnight -Afebrile, hemodynamically stable, no vasopressors -Has tolerated TC 35% fiO2 overnight -Received 1 unit pRBC's overnight for Hgb 7.4 ~ improved to 10.0 following transfusion  Objective   Blood pressure 112/66, pulse 100, temperature 99.2 F (37.3 C), temperature source Oral, resp. rate 16, height '5\' 2"'$  (1.575 m), weight 43.9 kg, SpO2 100 %.    Vent Mode: PSV FiO2 (%):  [35 %] 35 % PEEP:  [5 cmH20] 5 cmH20   Intake/Output Summary (Last 24 hours) at 06/11/2022 0855 Last data filed at 06/11/2022 0600 Gross per 24 hour  Intake 2506.03 ml  Output 2050 ml  Net 456.03 ml    Filed Weights   06/08/22 0459 06/10/22 0500 06/11/22 0500  Weight: 58.4 kg 45.7 kg 43.9 kg    Examination: General: Acute on chronically ill-appearing female, on supplemental O2 with TC via tracheostomy, no acute distress HENT: Atraumatic, normocephalic, swelling noted to left neck, #6 [place] midline clean dry and intact Lungs: Clear breath sounds throughout,, even, nonlabored Cardiovascular: Regular rate and rhythm, S1-S2, no murmurs, rubs, gallops Abdomen: Soft, nontender, nondistended, no guarding rebound tenderness, bowel sounds positive x4, PEG in place clean dry and intact Extremities: No deformities, no edema Neuro: Awake and alert, move all extremities to command, no focal deficits, pupils PERRL GU: Foley catheter in place draining yellow urine  Resolved Hospital Problem list     Assessment & Plan:   Acute Hypoxic Respiratory Failure in the setting of extensive Oropharyngeal Cancer with Hemoptysis and Aspiration Pneumonia s/p emergent Tracheostomy 06/09/22 for airway protection Should she tolerate trach collar, we will attempt a Passy-Muir valve. At this moment, she is not a candidate for decannulation given her extensive ENT tumor. -Supplemental O2  via Trach collar as needed to maintain O2 sats >92% -Follow intermittent Chest X-ray & ABG as needed -Bronchodilators  -ABX as above -Aggressive Pulmonary toilet as able -ENT following, appreciate input  Sepsis due to ? Necrotic Mass/fungating ulcerative lesion,  & Aspiration Pneumonia Does have a RLL infiltrate on CXR and a consolidated RLL on [place] with hepatization of the lung. Suspect aspiration of blood contents and associated aspiration pneumonia. She is currently on Unasyn with good response. 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 with extension into left cervical lymph node with fungating mass Left IJ vein thrombosis -Oncology, ENT, Vascular Surgery following, appreciate input ~ recommend transfer to [facility] (unfortunately no bed availability) -Long term prognosis is extremely poor ~ Palliative Care is following  Acute Blood loss anemia due to Oropharyngeal cancer High risk for acute arterial bleeding from tumor erosion S/p embolization and Carotid stent placement -Monitor for S/Sx of bleeding -Trend CBC (H&H q6h) -SCD's for VTE Prophylaxis  -Transfuse for Hgb <8 -Vascular surgery following, appreciate input  -Continue DAPT  Hyponatremia ~ RESOLVED Hypokalemia -Monitor I&O's / urinary output -Follow BMP -Ensure adequate renal perfusion -Avoid nephrotoxic agents as able -Replace electrolytes as indicated -Pharmacy following for assistance with electrolyte replacement     Pt is critically ill superimposed on severe oropharyngeal cancer.  Prognosis is extremely guarded, high risk for decompensation, cardiac arrest, and death.  Long term prognosis is exceedingly poor. Recommend DNR status.  Palliative Care is following for goals of care.   Best Practice (right click and "Reselect all SmartList Selections" daily)   Diet/type: NPO, tube feeds DVT  prophylaxis: SCD GI prophylaxis: PPI Lines: Central line (Port-a-cath, present on admission) Foley:  Yes, and it is still needed Code Status:  full code Last date of multidisciplinary goals of care discussion [06/09/22]  Will update pt's son when he arrives at bedside  Labs   CBC: Recent Labs  Lab 06/06/22 0449 06/06/22 1715 06/08/22 0454 06/09/22 0701 06/09/22 1629 06/09/22 2046 06/10/22 0201 06/10/22 0851 06/10/22 1502 06/10/22 2026 06/11/22 0208  WBC 18.8*  --  15.9*  --  16.4*  --  12.5*  --   --   --  11.9*  HGB 7.7*   < > 9.5*   < > 11.3*   < > 8.7* 9.0* 7.7* 7.4* 10.0*  HCT 24.7*   < > 29.0*   < > 32.4*   < > 26.3* 27.2* 22.5* 22.3* 29.5*  MCV 100.4*  --  93.5  --  89.8  --  91.6  --   --   --  89.7  PLT 367  --  294  --  463*  --  471*  --   --   --  401*   < > = values in this interval not displayed.     Basic Metabolic Panel: Recent Labs  Lab 06/06/22 0449 06/06/22 1715 06/08/22 0454 06/09/22 0701 06/10/22 0201 06/11/22 0208  NA 135  --  129* 131* 136 139  K 5.0  --  3.6 3.5 3.2* 3.3*  CL 100  --  92* 89* 99 103  CO2 29  --  30 33* 29 27  GLUCOSE 131*  --  131* 114* 147* 118*  BUN 9  --  7* 9 7* 7*  CREATININE 0.57  --  <0.30* 0.44 0.51 0.60  CALCIUM 8.5*  --  8.3* 8.2* 7.9* 7.9*  MG 1.6* 1.6* 1.4*  --  1.4* 2.0  PHOS 2.0* 5.3* 2.5  --  3.1 2.5    GFR: Estimated Creatinine Clearance: 51.2 mL/min (by C-G formula based on SCr of 0.6 mg/dL). Recent Labs  Lab 06/08/22 0454 06/09/22 1629 06/10/22 0201 06/11/22 0208  WBC 15.9* 16.4* 12.5* 11.9*     Liver Function Tests: Recent Labs  Lab 06/06/22 0449 06/09/22 0701  AST 15 23  ALT 9 16  ALKPHOS 92 86  BILITOT 0.3 0.3  PROT 5.6* 5.0*  ALBUMIN 2.1* 1.7*    No results for input(s): "LIPASE", "AMYLASE" in the last 168 hours. No results for input(s): "AMMONIA" in the last 168 hours.  ABG    Component Value Date/Time   PHART 7.6 (H) 06/10/2022 0500   PCO2ART 34 06/10/2022 0500    PO2ART 78 (L) 06/10/2022 0500   HCO3 33.4 (H) 06/10/2022 0500   O2SAT 98.4 06/10/2022 0500     Coagulation Profile: Recent Labs  Lab 06/09/22 0701  INR 1.0     Cardiac Enzymes: No results for input(s): "CKTOTAL", "CKMB", "CKMBINDEX", "TROPONINI" in the last 168 hours.  HbA1C: No results found for: "HGBA1C"  CBG: Recent Labs  Lab 06/10/22 1637 06/10/22 1914 06/10/22 2326 06/11/22 0343 06/11/22 0810  GLUCAP 123* 118* 131* 122* 112*     Review of Systems:   Unable to assess due to Tracheostomy   Past Medical History:  She,  has a past medical history of  Bipolar 1 disorder (HCC) and Seizures ([place]).   Surgical History:   Past Surgical History:  Procedure Laterality Date   CAROTID ANGIOGRAPHY Left 06/09/2022   Procedure: CAROTID ANGIOGRAPHY;  Surgeon: Algernon Huxley, MD;  Location: [facility];  Service: Cardiovascular;  Laterality: Left;   CESAREAN SECTION     TRACHEOSTOMY TUBE PLACEMENT N/A 06/09/2022   Procedure: AWAKE TRACHEOSTOMY;  Surgeon: Carloyn Manner, MD;  Location: [facility];  Service: ENT;  Laterality: N/A;     Social History:   reports that she has been smoking cigarettes. She has never used smokeless tobacco. She reports current alcohol use. She reports that she does not use drugs.   Family History:  Her Family history is unknown by patient.   Allergies Allergies  Allergen Reactions   Benadryl [Diphenhydramine] Other (See Comments)    Somnolence when given 25 mg IV; consider low dose administration     Home Medications  Prior to Admission medications   Medication Sig Start Date End Date Taking? Authorizing Provider  acetaminophen (TYLENOL) 325 MG tablet Take 2 tablets (650 mg total) by mouth every 4 (four) hours as needed for mild pain (or Fever >/= 101). 05/19/22  Yes Dwyane Dee, MD  gabapentin (NEURONTIN) 250 MG/5ML solution Take 12 mLs by mouth in the morning, at noon, and at bedtime. 05/24/22  Yes [provider]   morphine (MS CONTIN) 15 MG 12 hr tablet Take 1 tablet (15 mg total) by mouth 3 (three) times daily. 05/19/22  Yes Dwyane Dee, MD  oxyCODONE (ROXICODONE) 5 MG immediate release tablet Take 1 tablet (5 mg total) by mouth every 4 (four) hours as needed. 05/19/22 05/19/23 Yes Dwyane Dee, MD  oxyCODONE (ROXICODONE) 5 MG/5ML solution Take 5-10 mLs by mouth every 4 (four) hours as needed. 05/23/22  Yes [provider]  polyethylene glycol (MIRALAX / GLYCOLAX) 17 g packet SMARTSIG:1 Packet(s) By Mouth PRN 05/23/22  Yes [provider]  QUEtiapine (SEROQUEL) 100 MG tablet Take 100 mg by mouth at bedtime. 02/13/22  Yes [provider]  traZODone (DESYREL) 100 MG tablet Take 100 mg by mouth at bedtime. 02/13/22  Yes [provider]  gabapentin (NEURONTIN) 300 MG capsule Take 2 capsules (600 mg total) by mouth 3 (three) times daily. Patient not taking: Reported on 06/04/2022 05/19/22   Dwyane Dee, MD  naloxone [facility]) nasal spray 4 mg/0.1 mL SMARTSIG:1 Both Nares Daily 05/24/22   [provider]     Critical care time: 40 minutes     Darel Hong, AGACNP-BC [facility] Prefer epic messenger for cross cover needs If after hours, please call E-link

## 2022-06-12 ENCOUNTER — Inpatient Hospital Stay: Payer: Medicaid Other

## 2022-06-12 DIAGNOSIS — J690 Pneumonitis due to inhalation of food and vomit: Secondary | ICD-10-CM | POA: Diagnosis not present

## 2022-06-12 DIAGNOSIS — J9601 Acute respiratory failure with hypoxia: Secondary | ICD-10-CM | POA: Diagnosis not present

## 2022-06-12 DIAGNOSIS — Z7189 Other specified counseling: Secondary | ICD-10-CM | POA: Diagnosis not present

## 2022-06-12 DIAGNOSIS — I82C12 Acute embolism and thrombosis of left internal jugular vein: Secondary | ICD-10-CM | POA: Diagnosis not present

## 2022-06-12 DIAGNOSIS — C760 Malignant neoplasm of head, face and neck: Secondary | ICD-10-CM | POA: Diagnosis not present

## 2022-06-12 LAB — CBC
HCT: 27.1 % — ABNORMAL LOW (ref 36.0–46.0)
Hemoglobin: 9.1 g/dL — ABNORMAL LOW (ref 12.0–15.0)
MCH: 30.2 pg (ref 26.0–34.0)
MCHC: 33.6 g/dL (ref 30.0–36.0)
MCV: 90 fL (ref 80.0–100.0)
Platelets: 437 10*3/uL — ABNORMAL HIGH (ref 150–400)
RBC: 3.01 MIL/uL — ABNORMAL LOW (ref 3.87–5.11)
RDW: 13.6 % (ref 11.5–15.5)
WBC: 12.6 10*3/uL — ABNORMAL HIGH (ref 4.0–10.5)
nRBC: 0 % (ref 0.0–0.2)

## 2022-06-12 LAB — GLUCOSE, CAPILLARY
Glucose-Capillary: 109 mg/dL — ABNORMAL HIGH (ref 70–99)
Glucose-Capillary: 114 mg/dL — ABNORMAL HIGH (ref 70–99)
Glucose-Capillary: 123 mg/dL — ABNORMAL HIGH (ref 70–99)
Glucose-Capillary: 123 mg/dL — ABNORMAL HIGH (ref 70–99)
Glucose-Capillary: 124 mg/dL — ABNORMAL HIGH (ref 70–99)
Glucose-Capillary: 99 mg/dL (ref 70–99)

## 2022-06-12 LAB — BASIC METABOLIC PANEL
Anion gap: 5 (ref 5–15)
BUN: 6 mg/dL — ABNORMAL LOW (ref 8–23)
CO2: 27 mmol/L (ref 22–32)
Calcium: 8.1 mg/dL — ABNORMAL LOW (ref 8.9–10.3)
Chloride: 101 mmol/L (ref 98–111)
Creatinine, Ser: 0.6 mg/dL (ref 0.44–1.00)
GFR, Estimated: >60 mL/min (ref >60)
Glucose, Bld: 108 mg/dL — ABNORMAL HIGH (ref 70–99)
Potassium: 3.4 mmol/L — ABNORMAL LOW (ref 3.5–5.1)
Sodium: 133 mmol/L — ABNORMAL LOW (ref 135–145)

## 2022-06-12 MED ORDER — TRANEXAMIC ACID FOR INHALATION
500.0000 mg | Freq: Three times a day (TID) | RESPIRATORY_TRACT | Status: AC
  Administered 2022-06-12 – 2022-06-14 (×5): 500 mg via RESPIRATORY_TRACT
  Filled 2022-06-12 (×5): qty 10

## 2022-06-12 MED ORDER — TRANEXAMIC ACID FOR INHALATION
500.0000 mg | Freq: Three times a day (TID) | RESPIRATORY_TRACT | Status: DC
Start: 2022-06-12 — End: 2022-06-12
  Filled 2022-06-12 (×2): qty 10

## 2022-06-12 MED ORDER — POTASSIUM CHLORIDE 20 MEQ PO PACK
40.0000 meq | PACK | Freq: Once | ORAL | Status: AC
  Administered 2022-06-12: 40 meq
  Filled 2022-06-12: qty 2

## 2022-06-12 MED ORDER — OSMOLITE 1.2 CAL PO LIQD
1000.0000 mL | ORAL | Status: DC
  Administered 2022-06-12 – 2022-06-16 (×2): 1000 mL

## 2022-06-12 MED ORDER — MORPHINE SULFATE (CONCENTRATE) 10 MG/0.5ML PO SOLN
10.0000 mg | ORAL | Status: DC | PRN
  Administered 2022-06-12 – 2022-06-13 (×4): 10 mg
  Filled 2022-06-12 (×5): qty 0.5

## 2022-06-12 MED ORDER — NALOXONE HCL 0.4 MG/ML IJ SOLN: 0.4000 mg | INTRAMUSCULAR | Status: DC | PRN

## 2022-06-12 MED ORDER — FREE WATER
30.0000 mL | Status: DC
  Administered 2022-06-12 – 2022-06-16 (×25): 30 mL

## 2022-06-12 MED ORDER — JUVEN PO PACK
1.0000 | PACK | Freq: Two times a day (BID) | ORAL | Status: DC
  Administered 2022-06-12 – 2022-06-20 (×17): 1

## 2022-06-12 NOTE — Progress Notes (Signed)
Daily Progress Note   Patient Name: Christina Porter. [name]       Date: 06/12/2022 DOB: [birth year]  Age: 61 y.o. MRN#: [id number] Attending Physician: Candee Furbish, MD Primary Care Physician: Neomia Dear, MD Admit Date: 06/03/2022  Reason for Consultation/Follow-up: Establishing goals of care  Subjective: Notes and labs reviewed. Note from 8/11 reviewed; oncologic treatment has not yet been started, and treatment is palliative in nature.   Patient is resting in bed. She answers questions through hand signs, shaking and nodding head, and writing. She denies complaint at this time. She states she has 6 children, but her surrogate decision maker is "my oldest", the son the hospital has been talking with.   Upon speaking with her, she indicates she has been under the impression the planned treatment would be curative. She would like to know [facility] oncology's prognostic time lines with and without their anticipated treatments. Encouraged her to have her son ask this.   Of note, she indicates that no matter her prognosis, with or without oncologic intervention, she has children and grandchildren she needs to live for, and wants to live as long as possible. She indicates she would like any and all care indicated regardless of pain or suffering for as long as possible without limit. She indicates  that she will undergo whatever is needed to try to add every single extra day she can get on this earth.   Length of Stay: 8  Current Medications: Scheduled Meds:   aspirin  81 mg Per Tube Daily   Chlorhexidine Gluconate Cloth  6 each Topical Daily   clopidogrel  75 mg Per Tube Daily   free water  30 mL Per Tube Q4H   gabapentin  600 mg Per Tube TID   heparin flush  10 Units Intracatheter Once    morphine CONCENTRATE  10 mg Per Tube Q6H   nutrition supplement (JUVEN)  1 packet Per Tube BID BM   mouth rinse  15 mL Mouth Rinse 4 times per day   pantoprazole  40 mg Per Tube Daily   polyethylene glycol  17 g Per Tube Daily   QUEtiapine  100 mg Per Tube QHS   sodium chloride flush  10-40 mL Intracatheter Q12H   traZODone  100 mg Per Tube QHS    Continuous  Infusions:  sodium chloride 250 mL (06/09/22 1359)   ampicillin-sulbactam (UNASYN) IV 200 mL/hr at 06/12/22 1326   feeding supplement (OSMOLITE 1.2 CAL)      PRN Meds: acetaminophen (TYLENOL) oral liquid 160 mg/5 mL, acetaminophen **OR** acetaminophen, morphine CONCENTRATE, naLOXone (NARCAN)  injection, ondansetron **OR** ondansetron (ZOFRAN) IV, mouth rinse, sodium chloride, sodium chloride flush  Physical Exam Pulmonary:     Effort: Pulmonary effort is normal.     Comments: Trach with TC in place.  Neurological:     Mental Status: She is alert.             Vital Signs: BP 127/76 (BP Location: Left Arm)   Pulse 98   Temp 99.5 F (37.5 C) (Oral)   Resp 17   Ht '5\' 2"'$  (1.575 m)   Wt 40.9 kg   SpO2 99%   BMI 16.49 kg/m  SpO2: SpO2: 99 % O2 Device: O2 Device: Tracheostomy Collar O2 Flow Rate: O2 Flow Rate (L/min): 5 L/min  Intake/output summary:  Intake/Output Summary (Last 24 hours) at 06/12/2022 1443 Last data filed at 06/12/2022 1326 Gross per 24 hour  Intake 1535.09 ml  Output 3955 ml  Net -2419.91 ml   LBM: Last BM Date : 06/12/22 Baseline Weight: Weight: 42 kg Most recent weight: Weight: 40.9 kg       Patient Active Problem List   Diagnosis Date Noted   Aspiration pneumonia ([place]) 06/10/2022   Acute respiratory failure with hypoxia (HCC) 06/09/2022   Pressure injury of skin 06/07/2022   Head and neck cancer (HCC)    Hyponatremia 06/04/2022   SIRS (systemic inflammatory response syndrome) (HCC) 06/04/2022   Abnormal urinalysis 06/04/2022   Cancer associated pain 06/04/2022   Underweight 06/04/2022    Possible Seizure disorder ([place]) 05/16/2022   Bipolar 1 disorder ([place]) 05/16/2022   Tobacco abuse 05/16/2022   Throat cancer ([place]) 05/16/2022   Hypokalemia 05/16/2022   Protein-calorie malnutrition, severe 05/16/2022   Internal jugular vein thrombosis, 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 Patient would like any and all care indicated for as long as possible regardless of pain or suffering, without limit. She would like to add every extra day she can to her time here on earth.   PMT will continue to follow peripherally. Please call for immediate needs.    Code Status:    Code Status Orders  (From admission, onward)           Start     Ordered   06/04/22 0102  Full code  Continuous        06/04/22 0102           Code Status History     Date Active Date Inactive Code Status Order ID Comments User Context   05/16/2022 0202 05/19/2022 1800 Full Code [id number]  Mansy, Arvella Merles, MD ED      Advance Directive Documentation    Flowsheet Row Most Recent Value  Type of Advance Directive Healthcare Power of Attorney  Pre-existing out of facility DNR order (yellow form or pink MOST form) --  "MOST" Form in Place? --       Prognosis:  Very poor    Thank you for allowing the Palliative Medicine Team to assist in the care of this patient.   Asencion Gowda, NP  Please contact Palliative Medicine Team phone at [phone] for questions and concerns.

## 2022-06-12 NOTE — Progress Notes (Signed)
Inpatient Rehab Admissions Coordinator:   Per PT/OT recommendations pt was screened for CIR candidacy by Shann Medal, PT, DPT.  Note extensive cancer, palliative following and pt continues to desire aggressive care.  Medical team attempting transfer to [facility], where her oncologist is, but unable so far.  Pt able to tolerate <20 minutes of combined PT/OT today.  Not sure that she's at a point she can tolerate an aggressive rehab program, but I will follow for next therapy sessions and reassess.    Shann Medal, PT, DPT Admissions Coordinator [phone] 06/12/22  3:32 PM

## 2022-06-12 NOTE — Progress Notes (Addendum)
NAME:  Christina Porter, MRN:  [id number], DOB:  Feb 17, 1961, LOS: 8 ADMISSION DATE:  06/03/2022, CONSULTATION DATE:  06/09/2022 REFERRING MD:  Sharion Settler NP, CHIEF COMPLAINT:  Hemoptysis , Acute respiratory distress  Brief Pt Description / Synopsis:  61 y.o. 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  Developed Acute Respiratory Distress due to Hemoptysis requiring emergent Tracheostomy for airway protection, along with carotid artery embolization and carotid stent placement.  History of Present Illness:  Christina Porter is a 61 year old female with a past medical history significant for SCC of the soft palate on the left status post chemoradiation in May 2022 with recurrence and worsening of left neck and facial swelling, with tumor abutting on the left carotid and left IJ caused obstruction who presented to [facility] ED on 06/04/2022 due to shortness of breath and confusion.  Of note she was recently admitted to [facility] from 05/15/2022 through 05/19/2022 for left-sided facial swelling and neck swelling due to concern for septic thrombophlebitis of the left IJ.  She was treated with IV Unasyn and discharged home on p.o. Augmentin with plan for follow-up at [facility], with radiation starting on 05/30/2022.  She was seen in [facility] ED on 06/02/2022 for hypoxia and shortness of breath due to concern for aspiration pneumonia, unfortunately she left AMA.  She later presented to [facility] ED on the same day, but also left AMA.  She then returned to [facility] ED the following day on 06/03/2022 with noted tachycardia and tachypnea.  ED Course: Initial Vital Signs: Tmax 99.8, pulse 1 16-1 24, respirations 18-21 with initial BP 95/61 improving to 133/91 and O2 sat 98% on room  air Significant Labs: VBG with pH 7.49 and PCO2 46.  Lactic acid 0.7.  CBC pending.  CMP with sodium 133 and potassium 3.2.  CBC from [facility] showed a WBC of 11,300 and hemoglobin 9.7 and urinalysis with moderate leukocyte esterase.  Procalcitonin 1.13 Imaging Chest X-ray>>No acute abnormality CT head:>> IMPRESSION: 1. No acute intracranial abnormality 2. .Left retroauricular soft tissue mass better assessed on CT CT soft tissue neck with contrast>> IMPRESSION: 1. Unchanged appearance of confluent nodal mass at left level 2A with areas of central necrosis. 2. Unchanged 5 mm right level 1A node. 3. Unchanged cutaneous lesion of the left retroauricular region. 4. 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  Hospitalists were asked to admit for further workup and treatment.  Please see "significant hospital events" section below for full detailed hospital course.   Pertinent  Medical History   Past Medical History:  Diagnosis Date   Bipolar 1 disorder ([place])    Seizures ([place])     Micro Data:  8/5: Blood culture x2>> no growth 8/6: COVID-19 PCR>>negative 8/6: Urine >> no growth  Antimicrobials:  Ceftriaxone 8/6>>8/7 Unasyn 8/7>>8/7; restarted 8/10>> Fluconazole 8/8>>  Significant Hospital Events: Including procedures, antibiotic start and stop dates in addition to other pertinent events   8/6: Admitted by Hospitalist 8/7: ENT & ID consulted. 8/8: Oncology consulted. Concern for aspiration 8/9: Voice becoming hoarse. Palliative Care consulted 8/10: Some SOB noted. Vascular Surgery consulted. 8/11: Rapid response called due to recurrent bleeding with development of acute respiratory distress.  ENT performed emergent Tracheostomy, Vascular Surgery performed coil embolization of first 3 external  carotid artery branches and placed Left Carotid Stent.  PCCM consulted for vent management 8/12 attempted SBT, failed initial attempt secondary to tachypnea and hypoxia.   Later able to tolerate TC  8/13: Received 1 unit pRBC's overnight. Tolerating TC trials  Interim History / Subjective:  No events. Spiked temp. Pain remains inadequately controlled.  Objective   Blood pressure 105/68, pulse (!) 111, temperature 99.6 F (37.6 C), temperature source Oral, resp. rate 13, height '5\' 2"'$  (1.575 m), weight 40.9 kg, SpO2 100 %.    FiO2 (%):  [28 %] 28 %   Intake/Output Summary (Last 24 hours) at 06/12/2022 0755 Last data filed at 06/12/2022 0741 Gross per 24 hour  Intake 1628.4 ml  Output 4550 ml  Net -2921.6 ml    Filed Weights   06/10/22 0500 06/11/22 0500 06/12/22 0412  Weight: 45.7 kg 43.9 kg 40.9 kg    Examination: Chronically ill appearing woman in NAD L neck swelling, L neck dressed Trach CDI, lungs mostly clear Ext with muscle wasting Abdomen soft, PEG in place, TF ongoing  CBC stable anemia, leukocytosis, thrombocytosis  K low repleted  Resolved Hospital Problem list     Assessment & Plan:   Acute hypoxemic respiratory failure due to aspiration of blood in setting of extensive oropharyngeal cancer.  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  On palliative intent clinical trial regimen.  On hold for acute illness.  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 - Will see how does with PT/OT; seems too deconditioned, malnourished to attempt further palliative therapy  for her terminal cancer.  Called [name] again: still full.  Only reasonable thing here is SNF vs. Rehab and OP onc f/u UNLESS patient willing to go hospice.  Best Practice (right click and "Reselect all SmartList Selections" daily)   Diet/type: NPO, tube feeds DVT prophylaxis: SCD GI prophylaxis: PPI Lines: Central line (Port-a-cath, present on admission) Foley:  will see if can get removed Code Status:  full code Last date of multidisciplinary goals of care discussion [06/12/22: called son Barbette Reichmann, discussed trying to get her to point of OP [name] onc f/u; he agrees.  Will see how progresses with PT/OT/SLP.  Okay to transfer to floor and back to [facility] primary.]  Erskine Emery MD PCCM

## 2022-06-12 NOTE — Progress Notes (Signed)
At approximately 1930 patient's son was at desk, loudly requesting that if his mother gets transferred, her flowers go with her. Then walked back into room and came right back out and asked the NP for tissues, I stated I would bring some to the room and that he should stay in room and use call bell. As I brought the tissues back to the room I noticed to very young children in room, the patient's son asked if I could show his wife and daughters to the restroom, I did so. I then went back to the patient's room and explained the visiting policy for ICU, the patient's son stated that these were the patient's grandchildren, I stated that I understood this, but we have many patients with children and grandchildren that are not able to come see them in the ICU and that there are many safety concerns about having young children in ICU. Patient's son, Barbette Reichmann, voiced understanding and stated that he would take them home after they were able to say good bye to his mother.

## 2022-06-12 NOTE — Progress Notes (Signed)
Nutrition Follow Up Note   DOCUMENTATION CODES:   Underweight, Severe malnutrition in context of chronic illness  INTERVENTION:   Change to Osmolite 1.2_0 /hr continuous   Free water flushes 74m q4 hours to maintain tube patency   Regimen provides 1584kcal/day, 73g/day of protein and 12649mday of free water.   Juven Fruit Punch BID via tube, each serving provides 95kcal and 2.5g of protein (amino acids glutamine and arginine)  NUTRITION DIAGNOSIS:   Severe Malnutrition related to chronic illness (stage IV throat cancer) as evidenced by moderate fat depletion, severe fat depletion, moderate muscle depletion, severe muscle depletion. 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  Pt s/p emergent tracheostomy 8/11  Pt currently on trach collar. Pt tolerating tube feeds via G-tube. Will change pt back over to Osmolite as pt is more stable. Will add Juven for wound healing. Per chart, pt down 4lbs from her UBW. Pt  +7.6L on his I & Os. SLP evaluation pending for PMSV.   Medications reviewed and include: aspirin, plavix, heparin, MVI, protonix, miralax, unasyn  Labs reviewed: Na 133(L), K 3.4(L) P 2.5 wnl, Mg 2.0 wnl- 8/13 Wbc- 12.6(H), Hgb 9.1(L), Hct 27.1(L) Cbgs- 99, 124, 123, 109 x 24 hrs  UOP- 422551m Diet Order:   Diet Order             Diet NPO time specified  Diet effective now                  EDUCATION NEEDS:   Education needs have been addressed  Skin:  Skin  Assessment: Reviewed RN Assessment (Stage II sacrum, incision neck)  Last BM:  8/14- TYPE 7  Height:   Ht Readings from Last 1 Encounters:  06/09/22 _1  (1.575 m)    Weight:   Wt Readings from Last 1 Encounters:  06/12/22 40.9 kg    Ideal Body Weight:  50 kg  BMI:  Body mass index is 16.49 kg/m.  Estimated Nutritional Needs:   Kcal:  1400-1600kcal/day  Protein:  65-75g/day  Fluid:  1.3-1.5L/day  CasKoleen Distance, RD, LDN Please refer to [facility] RD and/or RD on-call/weekend/after hours pager

## 2022-06-12 NOTE — Evaluation (Signed)
Physical Therapy Evaluation Patient Details Name: Christina Porter MRN: [id number] DOB: 1961-01-02 Today's Date: 06/12/2022  History of Present Illness  Pt is a 61 y/o F admitted on 06/03/22 after presenting to the ED with c/o SOB & confusion. 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 Pt developed acute respiratory distress 2/2 hemoptysis requiring emergent tracheostomy for airway protection, carotid artery emobilization & carotid stent placement. PMH: squamous cell carcinoma of soft palate on L s/p chemoradiation (May 2022)  Clinical Impression  Pt seen for PT evaluation with co-tx with OT. Pt is very pleasant & agreeable to participate. Pt reports prior to admission she was living alone, independent without AD, & driving. Pt notes her daughter is at her place & can assist at d/c. Pt appears very frail but is able to complete supine>sit & stand pivot with min assist (+2 assist for managing lines/leads). Pt is very appreciative of PT/OT intervention. At this time, will recommend CIR upon d/c to help facilitate return to independent PLOF. Will continue to follow pt acutely to address balance, endurance, and gait with LRAD.    Recommendations for follow up therapy are one component of a multi-disciplinary discharge planning process, led by the attending physician.  Recommendations may be updated based on patient status, additional functional criteria and insurance authorization.  Follow Up Recommendations Acute inpatient rehab (3hours/day)      Assistance Recommended at Discharge Frequent or constant Supervision/Assistance  Patient can return home with the following  A lot of help with walking and/or transfers;A lot of help with  bathing/dressing/bathroom;Assist for transportation;Assistance with cooking/housework;Help with stairs or ramp for entrance    Equipment Recommendations None recommended by PT  Recommendations for Other Services       Functional Status Assessment Patient has had a recent decline in their functional status and demonstrates the ability to make significant improvements in function in a reasonable and predictable amount of time.     Precautions / Restrictions Precautions Precautions: Fall Restrictions Weight Bearing Restrictions: No      Mobility  Bed Mobility Overal bed mobility: Needs Assistance Bed Mobility: Supine to Sit     Supine to sit: Min assist, HOB elevated          Transfers Overall transfer level: Needs assistance   Transfers: Bed to chair/wheelchair/BSC   Stand pivot transfers: Min assist              Ambulation/Gait                  Stairs            Wheelchair Mobility    Modified Rankin (Stroke Patients Only)       Balance Overall balance assessment: Needs assistance Sitting-balance support: Feet supported, Bilateral upper extremity supported Sitting balance-Leahy Scale: Fair     Standing balance support: During functional activity, No upper extremity supported Standing balance-Leahy Scale: Fair                               Pertinent Vitals/Pain Pain Assessment Pain Assessment: Faces Faces Pain Scale: Hurts little more Pain Location: L side of face & neck Pain Descriptors / Indicators: Discomfort Pain Intervention(s): Patient requesting pain meds-RN notified  Home Living Family/patient expects to be discharged to:: Private residence Living Arrangements: Alone Available Help at Discharge: Family (reports her daughter is at her place & can assist at d/c) Type of Home: Apartment Home Access: Level entry       Home Layout: One level Home Equipment: Shower seat      Prior Function Prior Level of  Function : Independent/Modified Independent;Driving                     Hand Dominance   Dominant Hand: Right    Extremity/Trunk Assessment   Upper Extremity Assessment Upper Extremity Assessment: Generalized weakness    Lower Extremity Assessment Lower Extremity Assessment: Generalized weakness    Cervical / Trunk Assessment Cervical / Trunk Assessment:  (pt with L facial & neck swelling observed during session)  Communication   Communication:  (pt with new trach, unable to verbally communicate but writes PRN during session)  Cognition Arousal/Alertness: Awake/alert Behavior During Therapy: WFL for tasks assessed/performed Overall Cognitive Status: Within Functional Limits for tasks assessed                                 General Comments: sweet lady, follows commands throughout session, answers questions, appropriate during session        General Comments      Exercises     Assessment/Plan    PT Assessment Patient needs continued PT services  PT Problem List Decreased strength;Cardiopulmonary status limiting activity;Decreased activity tolerance;Decreased mobility;Decreased balance;Decreased skin integrity;Decreased knowledge of use of DME       PT Treatment Interventions DME instruction;Therapeutic exercise;Gait training;Balance training;Stair training;Neuromuscular re-education;Functional mobility training;Patient/family education;Therapeutic activities    PT Goals (Current goals can be found in the Care Plan section)  Acute Rehab PT Goals Patient Stated Goal: get better, return to PLOF PT Goal Formulation: With patient Time For Goal Achievement: 06/26/22 Potential to Achieve Goals: Good    Frequency 7X/week     Co-evaluation PT/OT/SLP Co-Evaluation/Treatment: Yes Reason for Co-Treatment: Complexity of the patient's impairments (multi-system involvement) PT goals addressed during session: Mobility/safety with mobility;Balance          AM-PAC PT "6 Clicks" Mobility  Outcome Measure Help needed turning from your back to your side while in a flat bed without using bedrails?: A Little Help needed moving from lying on your back to sitting on the side of a flat bed without using bedrails?: A Little Help needed moving to and from a bed to a chair (including a wheelchair)?: A Little Help needed standing up from a chair using your arms (e.g., wheelchair or bedside chair)?: A Little Help needed to walk in hospital room?: A Lot Help needed climbing 3-5 steps with a railing? : A Lot 6 Click Score: 16    End of Session   Activity Tolerance: Patient tolerated treatment well Patient left: in chair;with call bell/phone within reach Nurse Communication: Mobility status;Patient requests pain meds PT Visit Diagnosis: Unsteadiness on feet (R26.81);Muscle weakness (generalized) (M62.81);Other abnormalities of gait and mobility (R26.89)    Time: 5409-8119 PT Time Calculation (min) (ACUTE ONLY): 17 min   Charges:   PT Evaluation $PT Eval High Complexity: 1 High          Lavone Nian, PT, DPT 06/12/22, 11:55 AM   Waunita Schooner 06/12/2022, 11:53 AM

## 2022-06-12 NOTE — Evaluation (Signed)
Occupational Therapy Evaluation Patient Details Name: Christina Porter. Christina Porter MRN: [id number] DOB: 1961-01-28 Today's Date: 06/12/2022   History of Present Illness Pt is a 61 y/o F admitted on 06/03/22 after presenting to the ED with c/o SOB & confusion. 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 Pt developed acute respiratory distress 2/2 hemoptysis requiring emergent tracheostomy for airway protection, carotid artery emobilization & carotid stent placement. 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 Pt with new trach and uses paper and pen, hand gestures, and head nods to communicate during session.  Patient reports being Ind at baseline and living at home alone PTA. She does endorse having a daughter that can assist at discharge as needed. Patient currently functioning at min - mod A for self care tasks. She appears to fatigue quickly and does report pain in L neck/face area. RN notified. Pt stands and transfers with +2 assistance for pt and management of lines/leads. She is very pleasant and motivated throughout. Patient will benefit from acute OT to increase overall independence in the areas of ADLs, functional mobility, and safety awareness in order to safely discharge to next venue of care.      Recommendations for follow up therapy are one component of a multi-disciplinary discharge planning process, led by the attending physician.  Recommendations may be updated based on patient status, additional functional criteria and insurance authorization.   Follow Up Recommendations   Acute inpatient rehab (3hours/day)    Assistance Recommended at Discharge Intermittent Supervision/Assistance  Patient can return home with the following A little help with walking and/or transfers;A lot of help with bathing/dressing/bathroom;Assistance with cooking/housework;Help with stairs or ramp for entrance;Assist for transportation    Functional Status Assessment  Patient has had a recent decline in their functional status and demonstrates the ability to make significant improvements in function in a reasonable and predictable amount of time.  Equipment Recommendations  Other (comment) (defer to next venue of care)       Precautions / Restrictions Precautions Precautions: Fall Restrictions Weight Bearing Restrictions: No      Mobility Bed Mobility Overal bed mobility: Needs Assistance Bed Mobility: Supine to Sit     Supine to sit: Min assist, HOB elevated          Transfers Overall transfer level: Needs assistance Equipment used: 1 person hand held assist Transfers: Bed to chair/wheelchair/BSC   Stand pivot transfers: Min assist, +2 safety/equipment                Balance Overall balance assessment: Needs assistance Sitting-balance support: Feet supported, Bilateral upper extremity supported Sitting balance-Leahy Scale: Fair     Standing balance support: During functional activity, No upper extremity supported Standing balance-Leahy Scale: Fair                             ADL either performed or assessed with clinical judgement   ADL Overall ADL's : Needs assistance/impaired     Grooming: Wash/dry hands;Wash/dry face;Sitting;Supervision/safety;Set up           Upper Body Dressing : Minimal assistance;Sitting  Upper Body Dressing Details (indicate cue type and reason): to change gown while seated on EOB                          Pertinent Vitals/Pain Pain Assessment Pain Assessment: Faces Faces Pain Scale: Hurts little  more Pain Location: L side of face & neck Pain Descriptors / Indicators: Discomfort Pain Intervention(s): Patient requesting pain meds-RN notified, Monitored during session     Hand Dominance Right   Extremity/Trunk Assessment Upper Extremity Assessment Upper Extremity Assessment: Generalized weakness   Lower Extremity Assessment Lower Extremity Assessment: Generalized weakness   Cervical / Trunk Assessment Cervical / Trunk Assessment:  (pt with L facial & neck swelling observed during session)   Communication Communication Communication: Other (comment) (pt with new trach and used pen, paper, and head nods to communicate.)   Cognition Arousal/Alertness: Awake/alert Behavior During Therapy: WFL for tasks assessed/performed Overall Cognitive Status: Within Functional Limits for tasks assessed                                 General Comments: sweet lady, follows commands throughout session, answers questions, appropriate during session                Home Living Family/patient expects to be discharged to:: Private residence Living Arrangements: Alone Available Help at Discharge: Family (reports her daughter can stay with her if needed) Type of Home: Apartment Home Access: Level entry     Home Layout: One level     Bathroom Shower/Tub: Teacher, early years/pre: Standard     Home Equipment: Shower seat          Prior Functioning/Environment Prior Level of Function : Independent/Modified Independent;Driving                        OT Problem List: Decreased strength;Decreased activity tolerance;Impaired balance (sitting and/or standing);Decreased safety awareness;Decreased knowledge of precautions;Decreased coordination;Decreased knowledge of use of DME or AE;Cardiopulmonary status limiting activity      OT Treatment/Interventions: Self-care/ADL training;Therapeutic exercise;Therapeutic activities;Manual therapy;Balance  training;Modalities;Patient/family education;Energy conservation;DME and/or AE instruction    OT Goals(Current goals can be found in the care plan section) Acute Rehab OT Goals Patient Stated Goal: to get stronger and return to PLOF OT Goal Formulation: With patient Time For Goal Achievement: 06/26/22 Potential to Achieve Goals: Good ADL Goals Pt Will Perform Grooming: with supervision Pt Will Perform Lower Body Dressing: with supervision;sit to/from stand Pt Will Transfer to Toilet: with supervision;ambulating Pt Will Perform Toileting - Clothing Manipulation and hygiene: with supervision;sit to/from stand  OT Frequency: Min 2X/week    Co-evaluation PT/OT/SLP Co-Evaluation/Treatment: Yes Reason for Co-Treatment: Complexity of the patient's impairments (multi-system involvement);To address functional/ADL transfers PT goals addressed during session: Mobility/safety with mobility;Balance OT goals addressed during session: ADL's and self-care      AM-PAC OT "6 Clicks" Daily Activity     Outcome Measure Help from another person eating meals?: None Help from another person taking care of personal grooming?: A Little Help from another person toileting, which includes using toliet, bedpan, or urinal?: A Lot Help from another person bathing (including washing, rinsing, drying)?: A Lot Help from another person to put on and taking off regular upper body clothing?: A Little Help from another person to put on and taking off regular lower body clothing?: A Lot 6 Click Score: 16  End of Session Equipment Utilized During Treatment: Rolling walker (2 wheels) Nurse Communication: Mobility status  Activity Tolerance: Patient limited by fatigue Patient left: with call bell/phone within reach;in chair;with nursing/sitter in room  OT Visit Diagnosis: Unsteadiness on feet (R26.81);Muscle weakness (generalized) (M62.81)                Time: 2725-3664 OT Time Calculation (min): 19 min Charges:  OT  General Charges $OT Visit: 1 Visit OT Evaluation $OT Eval Moderate Complexity: 1 [address], [place], OTR/L , CBIS ascom [phone]  06/12/22, 1:32 PM

## 2022-06-12 NOTE — Progress Notes (Signed)
ID Pt is in ICU Had hemoptysis and underwent  urgent tracheostomy and carotid artery embolization /stent on 06/09/22  BP 127/78   Pulse (!) 101   Temp 99.5 F (37.5 C) (Oral)   Resp 18   Ht '5\' 2"'$  (1.575 m)   Wt 40.9 kg   SpO2 99%   BMI 16.49 kg/m   Awake Bloody secretions from tracheostomy site Left neck fungating mass B/l air entry- rhonchi Labs    Latest Ref Rng & Units 06/12/2022    3:21 AM 06/11/2022    1:41 PM 06/11/2022    2:08 AM  CBC  WBC 4.0 - 10.5 K/uL 12.6   11.9   Hemoglobin 12.0 - 15.0 g/dL 9.1  10.3  10.0   Hematocrit 36.0 - 46.0 % 27.1  30.4  29.5   Platelets 150 - 400 K/uL 437   401        Latest Ref Rng & Units 06/12/2022    3:21 AM 06/11/2022    2:08 AM 06/10/2022    2:01 AM  CMP  Glucose 70 - 99 mg/dL 108  118  147   BUN 8 - 23 mg/dL '6  7  7   '$ Creatinine 0.44 - 1.00 mg/dL 0.60  0.60  0.51   Sodium 135 - 145 mmol/L 133  139  136   Potassium 3.5 - 5.1 mmol/L 3.4  3.3  3.2   Chloride 98 - 111 mmol/L 101  103  99   CO2 22 - 32 mmol/L '27  27  29   '$ Calcium 8.9 - 10.3 mg/dL 8.1  7.9  7.9     Impression/recommendation 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  Also extension into soft tissue of the neck with obstruction of left IJ and also compression of the ICA. 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 Patient leukocytosis secondary to the fungating mass as well as aspiration. On unasyn-    Acute hypoxic respiratory failure secondary to the above   COPD  Current smoker  Anemia ? Discussed the management with the care team

## 2022-06-12 NOTE — Progress Notes (Addendum)
SLP Cancellation Note  Patient Details Name: Christina Porter R. Fredrick Dray MRN: [id number] DOB: [birth year]   Cancelled treatment:       Reason Eval/Treat Not Completed: Medical issues which prohibited therapy;Patient not medically ready (chart reviewed; consulted NSG re: pt's status and degree of secretions today. Pt is on trach collar wean day #1.). Pt is communicating via gestures and writing.  Per NSG report this PM, pt presents w/ copious, profuse bloody/phlegmy secretions. Pt does exhibit a cough in response and during mobility(up to chair and BSC). NSG stated the inner cannula has been changed 2x this afternoon w/ thick, copious secretions "sticking to it". The trach collar mask itself has secretions in it at times also.  In setting of the above, pt is not appropriate for PMV evaluation nor use/wear d/t the degree of secretions. When pt's secretions have lessened to MINIMAL, then PMV evaluation can be initiated. NSG agreed.  ST services will f/u tomorrow to assess pt's status.        Orinda Kenner, MS, CCC-SLP Speech Language Pathologist Rehab Services; [place] [phone] (ascom) Katheen Aslin 06/12/2022, 3:29 PM

## 2022-06-12 NOTE — Progress Notes (Signed)
Pt's son arrived to unit around 8pm, with young children and his wife. Charge spoke with family at bedside about visitor policy and they verbally agreed to say their goodbyes and also agreed to leave the flowers outside the pt's room. A sign on the door was placed with visiting regulations and safety risks. Pt is resting calmly in bed with trach collar, tolerating well throughout the night. She spiked a fever of 101.4, tylenol was given and pt's BP remains stable.

## 2022-06-13 DIAGNOSIS — J690 Pneumonitis due to inhalation of food and vomit: Secondary | ICD-10-CM | POA: Diagnosis not present

## 2022-06-13 DIAGNOSIS — C760 Malignant neoplasm of head, face and neck: Secondary | ICD-10-CM | POA: Diagnosis not present

## 2022-06-13 DIAGNOSIS — J9601 Acute respiratory failure with hypoxia: Secondary | ICD-10-CM | POA: Diagnosis not present

## 2022-06-13 LAB — GLUCOSE, CAPILLARY
Glucose-Capillary: 114 mg/dL — ABNORMAL HIGH (ref 70–99)
Glucose-Capillary: 133 mg/dL — ABNORMAL HIGH (ref 70–99)
Glucose-Capillary: 114 mg/dL — ABNORMAL HIGH (ref 70–99)
Glucose-Capillary: 126 mg/dL — ABNORMAL HIGH (ref 70–99)
Glucose-Capillary: 125 mg/dL — ABNORMAL HIGH (ref 70–99)
Glucose-Capillary: 118 mg/dL — ABNORMAL HIGH (ref 70–99)
Glucose-Capillary: 126 mg/dL — ABNORMAL HIGH (ref 70–99)

## 2022-06-13 LAB — CBC WITH DIFFERENTIAL/PLATELET
Abs Immature Granulocytes: 0.2 10*3/uL — ABNORMAL HIGH (ref 0.00–0.07)
Basophils Absolute: 0 10*3/uL (ref 0.0–0.1)
Basophils Relative: 0 %
Eosinophils Absolute: 0.2 10*3/uL (ref 0.0–0.5)
Eosinophils Relative: 1 %
HCT: 27.5 % — ABNORMAL LOW (ref 36.0–46.0)
Hemoglobin: 9.1 g/dL — ABNORMAL LOW (ref 12.0–15.0)
Immature Granulocytes: 1 %
Lymphocytes Relative: 3 %
Lymphs Abs: 0.6 10*3/uL — ABNORMAL LOW (ref 0.7–4.0)
MCH: 30.3 pg (ref 26.0–34.0)
MCHC: 33.1 g/dL (ref 30.0–36.0)
MCV: 91.7 fL (ref 80.0–100.0)
Monocytes Absolute: 1.5 10*3/uL — ABNORMAL HIGH (ref 0.1–1.0)
Monocytes Relative: 9 %
Neutro Abs: 14 10*3/uL — ABNORMAL HIGH (ref 1.7–7.7)
Neutrophils Relative %: 86 %
Platelets: 481 10*3/uL — ABNORMAL HIGH (ref 150–400)
RBC: 3 MIL/uL — ABNORMAL LOW (ref 3.87–5.11)
RDW: 13.7 % (ref 11.5–15.5)
WBC: 16.5 10*3/uL — ABNORMAL HIGH (ref 4.0–10.5)
nRBC: 0 % (ref 0.0–0.2)

## 2022-06-13 LAB — BASIC METABOLIC PANEL
Anion gap: 8 (ref 5–15)
BUN: 13 mg/dL (ref 8–23)
CO2: 25 mmol/L (ref 22–32)
Calcium: 8.1 mg/dL — ABNORMAL LOW (ref 8.9–10.3)
Chloride: 97 mmol/L — ABNORMAL LOW (ref 98–111)
Creatinine, Ser: 0.41 mg/dL — ABNORMAL LOW (ref 0.44–1.00)
GFR, Estimated: >60 mL/min (ref >60)
Glucose, Bld: 143 mg/dL — ABNORMAL HIGH (ref 70–99)
Potassium: 3.4 mmol/L — ABNORMAL LOW (ref 3.5–5.1)
Sodium: 130 mmol/L — ABNORMAL LOW (ref 135–145)

## 2022-06-13 LAB — MAGNESIUM: Magnesium: 1.2 mg/dL — ABNORMAL LOW (ref 1.7–2.4)

## 2022-06-13 LAB — PHOSPHORUS: Phosphorus: 3.7 mg/dL (ref 2.5–4.6)

## 2022-06-13 MED ORDER — LIDOCAINE VISCOUS HCL 2 % MT SOLN: 15.0000 mL | OROMUCOSAL | Status: DC | PRN

## 2022-06-13 MED ORDER — MAGNESIUM SULFATE 4 GM/100ML IV SOLN
4.0000 g | Freq: Once | INTRAVENOUS | Status: AC
  Administered 2022-06-13: 4 g via INTRAVENOUS
  Filled 2022-06-13: qty 100

## 2022-06-13 MED ORDER — POTASSIUM CHLORIDE 10 MEQ/100ML IV SOLN
10.0000 meq | INTRAVENOUS | Status: AC
  Administered 2022-06-13 (×2): 10 meq via INTRAVENOUS
  Filled 2022-06-13 (×2): qty 100

## 2022-06-13 MED ORDER — SODIUM CHLORIDE 0.9 % IV SOLN: INTRAVENOUS | Status: DC

## 2022-06-13 NOTE — Progress Notes (Signed)
MD requested that patient being given TXA via facemask due to location of bleed. Trach capped during nebulizer so aerosol could be entrained through upper airway. Patient observed for adverse reactions and no occurred during treatment. Patients saturation and WOB remained stable for the duration and after. Trach uncapped when trach compled.

## 2022-06-13 NOTE — Progress Notes (Addendum)
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 Augmentin with plan for follow-up at [facility], with radiation starting on 05/30/2022.  She was seen in [facility] ED on 06/02/2022 for hypoxia and shortness of breath due to concern for aspiration pneumonia, unfortunately she left AMA.  She later presented to [facility] ED on the same day, but also left AMA. Presented again to [facility] ED 06/03/2022 w/ tachycardia/tachypnea.   Patient was admitted for sepsis secondary to possible UTI vs neck mass, started on IV fluids and IV antibiotics. 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   8/6: Admitted by Hospitalist 8/7: ENT & ID consulted. 8/8: Oncology consulted. Concern for aspiration 8/9: Voice becoming hoarse. Palliative Care consulted 8/10: Some SOB noted. Vascular Surgery consulted. 8/11: Rapid response called due to recurrent bleeding with development of acute respiratory distress.  ENT performed emergent Tracheostomy, Vascular Surgery performed coil embolization of first 3 external carotid artery branches and placed Left Carotid Stent.  PCCM consulted for vent management 8/12 attempted SBT, failed initial attempt secondary to tachypnea and hypoxia.  Later able to tolerate TC  8/13: Received 1 unit pRBC's overnight. Tolerating TC  trials 8/15: transfer back to hospitalist service   Since patient follows up with hem-onc at [facility] (family is hopeful for treatment there to be helpful), multiple attempts have been made to transfer the patient to [facility] but no bed available.  Oncologist Dr Christina Porter at [facility] 06/06/2022 reported they would accept patient when bed available.   Per palliative care note 08/14 and c/w multiple previous discussions w/ palliative care: oldest son is her preferred decision-maker. "She indicates  that she will undergo whatever is needed to try to add every single extra day she can get on this earth... Full code and full scope. Patient would like any and all care indicated for as long as possible regardless of pain or suffering, without limit."  Communication w/ [facility] 06/13/22  Oncology: called [facility] [phone]) [phone] on 06/13/22 10:17 AM they were able to page Dr Christina Porter to my cell phone, we spoke at 10:23 AM, his cell is [phone] and gave me permission to contact him at that number, he has my cell phone also. He confirmed he spoke to Christina Porter attending over the weekend, this patient was consented to clinical trial but hadn't had treatment yet, there was never any anticipation that she was undergoing "curative" measures, only palliative, which was communicated to patient and family. Dr. Harriette Porter suggested reaching out to Dr. Fredderick Porter ENT to see if she can be accepted to surgical bed.  Called transfer line: [phone]. Full for surgical and medical beds. Asked transfer coordinator to consult for ENT. I spoke w/ Dr. Nicki Reaper Porter 10:47 AM who confirmed limited ability to help, recommended possible transfer to other tertiary care center.    Consultants:  ID ENT Oncology Pulmonology  Palliative Vascular surgery  PCCU  Procedures: 08/11 ENT performed emergent Tracheostomy, Vascular Surgery performed coil embolization of first 3 external carotid artery branches and placed Left Carotid Stent.  PCCM  consulted for vent management    Subjective: Patient unable to verbally participate in history, she shrugs her      ASSESSMENT & PLAN:   Principal Problem:   Acute respiratory failure with hypoxia ([place]) Active Problems:   SIRS (systemic inflammatory response syndrome) (HCC)   Throat cancer ([place])   Internal jugular vein thrombosis, left (HCC)   Possible Seizure disorder ([place])   Bipolar 1 disorder (HCC)   Tobacco abuse   Hyponatremia   Abnormal urinalysis   Cancer associated pain   Underweight   Head and neck cancer ([place])   Pressure injury of skin   Aspiration pneumonia ([place])   Acute hypoxemic respiratory failure due to aspiration of blood in setting of extensive oropharyngeal cancer. Improved. 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  On palliative intent clinical trial regimen but treatment has not been started yet. See hospital course for communication w/ Onc and ENT at [facility].  On hold for acute illness.   Left IJ vein thrombosis- related to tumor, AC contraindicated with ABLA at present   Hypokalemia- repleted   Sacral pressure ulcer POA   Chronic opiate dependence with acute pain   - Continue TC - Post trach care per ENT - Hold AC for now, monitor CBC - PT/OT - Unasyn to complete 7 days, monitor fever/WBC curve - Continue DAPT - Local care/turning for sacral wound - Add PRN morphine to standing morphine - Currently DNR. - Will see how does with PT/OT; seems too deconditioned, malnourished to attempt further palliative therapy for her terminal cancer.  Called [name] again: still full.  Only reasonable thing here is SNF vs. Rehab and OP onc f/u UNLESS patient willing to go hospice which we can arrange. Attempting transfer to [facility] - see hospital course       Diet/type: NPO,  tube feeds DVT prophylaxis: SCD GI prophylaxis: PPI Lines: Central line (Port-a-cath, present on admission) Foley:  will see if can get removed Code Status:  full code Family Communication: 06/12/22: Porter attending called son Barbette Reichmann, discussed trying to get her to point of OP [name] onc f/u; he agrees.  Will see how progresses with PT/OT/SLP.  Okay to transfer to floor and back to [facility] primary. Disposition Plan / TOC needs: likely needs SNF, remains inpatient for now              Objective: Vitals:   06/13/22 0300 06/13/22 0400 06/13/22 0500 06/13/22 0822  BP: 125/69 135/88 113/76   Pulse: (!) 109 (!) 115    Resp: '17 15 17   '$ Temp:  (!) 100.8 F (38.2 C)    TempSrc:  Oral    SpO2:  100%  100%  Weight:   39.8 kg   Height:        Intake/Output Summary (Last 24 hours) at 06/13/2022 1049 Last data filed at 06/13/2022 0400 Gross per 24 hour  Intake 1458.73 ml  Output 920 ml  Net 538.73 ml   Filed Weights   06/11/22 0500 06/12/22 0412 06/13/22 0500  Weight: 43.9 kg 40.9 kg 39.8 kg    Examination:  Constitutional:  VS as above General Appearance: alert,  frail, NAD Neck: Bandage on place L neck over cancer site, trach in place Respiratory: Normal respiratory effort Coarse breath sounds No rales Cardiovascular: S1/S2 normal Tachycardic No lower extremity edema Gastrointestinal: No tenderness Musculoskeletal:  No clubbing/cyanosis of digits Symmetrical movement in all extremities Neurological: Alert Psychiatric: Fair judgment/insight Flat mood and affect       Scheduled Medications:   aspirin  81 mg Per Tube Daily   Chlorhexidine Gluconate Cloth  6 each Topical Daily   clopidogrel  75 mg Per Tube Daily   free water  30 mL Per Tube Q4H   gabapentin  600 mg Per Tube TID   heparin flush  10 Units Intracatheter Once   morphine CONCENTRATE  10 mg Per Tube Q6H   nutrition supplement (JUVEN)  1 packet Per Tube BID BM   mouth rinse  15 mL Mouth Rinse 4  times per day   pantoprazole  40 mg Per Tube Daily   polyethylene glycol  17 g Per Tube Daily   QUEtiapine  100 mg Per Tube QHS   sodium chloride flush  10-40 mL Intracatheter Q12H   tranexamic acid  500 mg Nebulization Q8H   traZODone  100 mg Per Tube QHS    Continuous Infusions:  sodium chloride 250 mL (06/09/22 1359)   ampicillin-sulbactam (UNASYN) IV 3 g (06/13/22 0512)   feeding supplement (OSMOLITE 1.2 CAL) 55 mL/hr at 06/13/22 0400    PRN Medications:  acetaminophen (TYLENOL) oral liquid 160 mg/5 mL, acetaminophen **OR** acetaminophen, morphine CONCENTRATE, naLOXone (NARCAN)  injection, ondansetron **OR** ondansetron (ZOFRAN) IV, mouth rinse, sodium chloride, sodium chloride flush  Antimicrobials:  Anti-infectives (From admission, onward)    Start     Dose/Rate Route Frequency Ordered Stop   06/09/22 0748  ceFAZolin (ANCEF) IVPB 2g/100 mL premix  Status:  Discontinued        2 g 200 mL/hr over 30 Minutes Intravenous 30 min pre-op 06/09/22 0748 06/09/22 0914   06/08/22 0600  Ampicillin-Sulbactam (UNASYN) 3 g in sodium chloride 0.9 % 100 mL IVPB        3 g 200 mL/hr over 30 Minutes Intravenous Every 6 hours 06/07/22 1547     06/06/22 2200  fluconazole (DIFLUCAN) IVPB 200 mg  Status:  Discontinued        200 mg 100 mL/hr over 60 Minutes Intravenous Every 24 hours 06/05/22 1622 06/10/22 1506   06/05/22 2200  piperacillin-tazobactam (ZOSYN) IVPB 3.375 g        3.375 g 12.5 mL/hr over 240 Minutes Intravenous Every 8 hours 06/05/22 1622 06/07/22 2359   06/05/22 1800  fluconazole (DIFLUCAN) IVPB 400 mg        400 mg 100 mL/hr over 120 Minutes Intravenous  Once 06/05/22 1622 06/07/22 0207   06/05/22 1545  Ampicillin-Sulbactam (UNASYN) 3 g in sodium chloride 0.9 % 100 mL IVPB  Status:  Discontinued        3 g 200 mL/hr over 30 Minutes Intravenous Every 6 hours 06/05/22 1450 06/05/22 1622   06/04/22 0115  cefTRIAXone (ROCEPHIN) 2 g in sodium chloride 0.9 % 100 mL IVPB  Status:   Discontinued        2 g 200 mL/hr over 30 Minutes Intravenous Every 24 hours 06/04/22 0102 06/05/22 1449   06/04/22 0045  cefTRIAXone (ROCEPHIN) 1 g in sodium chloride 0.9 % 100 mL IVPB  Status:  Discontinued        1 g 200 mL/hr over 30 Minutes Intravenous  Once 06/04/22 0030 06/04/22  0109       Data Reviewed: I have personally reviewed following labs and imaging studies  CBC: Recent Labs  Lab 06/09/22 1629 06/09/22 2046 06/10/22 0201 06/10/22 0851 06/10/22 2026 06/11/22 0208 06/11/22 1341 06/12/22 0321 06/13/22 0428  WBC 16.4*  --  12.5*  --   --  11.9*  --  12.6* 16.5*  NEUTROABS  --   --   --   --   --   --   --   --  14.0*  HGB 11.3*   < > 8.7*   < > 7.4* 10.0* 10.3* 9.1* 9.1*  HCT 32.4*   < > 26.3*   < > 22.3* 29.5* 30.4* 27.1* 27.5*  MCV 89.8  --  91.6  --   --  89.7  --  90.0 91.7  PLT 463*  --  471*  --   --  401*  --  437* 481*   < > = values in this interval not displayed.   Basic Metabolic Panel: Recent Labs  Lab 06/06/22 1715 06/08/22 0454 06/08/22 0454 06/09/22 0701 06/10/22 0201 06/11/22 0208 06/12/22 0321 06/13/22 0428  NA  --  129*   < > 131* 136 139 133* 130*  K  --  3.6   < > 3.5 3.2* 3.3* 3.4* 3.4*  CL  --  92*   < > 89* 99 103 101 97*  CO2  --  30   < > 33* '29 27 27 25  '$ GLUCOSE  --  131*   < > 114* 147* 118* 108* 143*  BUN  --  7*   < > 9 7* 7* 6* 13  CREATININE  --  <0.30*   < > 0.44 0.51 0.60 0.60 0.41*  CALCIUM  --  8.3*   < > 8.2* 7.9* 7.9* 8.1* 8.1*  MG 1.6* 1.4*  --   --  1.4* 2.0  --  1.2*  PHOS 5.3* 2.5  --   --  3.1 2.5  --  3.7   < > = values in this interval not displayed.   GFR: Estimated Creatinine Clearance: 46.4 mL/min (A) (by C-G formula based on SCr of 0.41 mg/dL (L)). Liver Function Tests: Recent Labs  Lab 06/09/22 0701  AST 23  ALT 16  ALKPHOS 86  BILITOT 0.3  PROT 5.0*  ALBUMIN 1.7*   No results for input(s): "LIPASE", "AMYLASE" in the last 168 hours. No results for input(s): "AMMONIA" in the last 168  hours. Coagulation Profile: Recent Labs  Lab 06/09/22 0701  INR 1.0   Cardiac Enzymes: No results for input(s): "CKTOTAL", "CKMB", "CKMBINDEX", "TROPONINI" in the last 168 hours. BNP (last 3 results) No results for input(s): "PROBNP" in the last 8760 hours. HbA1C: No results for input(s): "HGBA1C" in the last 72 hours. CBG: Recent Labs  Lab 06/12/22 1128 06/12/22 1543 06/12/22 2337 06/13/22 0404 06/13/22 0741  GLUCAP 99 114* 123* 114* 133*   Lipid Profile: No results for input(s): "CHOL", "HDL", "LDLCALC", "TRIG", "CHOLHDL", "LDLDIRECT" in the last 72 hours. Thyroid Function Tests: No results for input(s): "TSH", "T4TOTAL", "FREET4", "T3FREE", "THYROIDAB" in the last 72 hours. Anemia Panel: No results for input(s): "VITAMINB12", "FOLATE", "FERRITIN", "TIBC", "IRON", "RETICCTPCT" in the last 72 hours. Urine analysis:    Component Value Date/Time   COLORURINE YELLOW (A) 06/04/2022 0020   APPEARANCEUR CLEAR (A) 06/04/2022 0020   APPEARANCEUR Clear 08/31/2013 0312   LABSPEC 1.023 06/04/2022 0020   LABSPEC 1.003 08/31/2013 0312   PHURINE  8.0 06/04/2022 0020   GLUCOSEU NEGATIVE 06/04/2022 0020   GLUCOSEU Negative 08/31/2013 0312   HGBUR NEGATIVE 06/04/2022 0020   BILIRUBINUR NEGATIVE 06/04/2022 0020   BILIRUBINUR Negative 08/31/2013 0312   KETONESUR NEGATIVE 06/04/2022 0020   PROTEINUR NEGATIVE 06/04/2022 0020   NITRITE NEGATIVE 06/04/2022 0020   LEUKOCYTESUR SMALL (A) 06/04/2022 0020   LEUKOCYTESUR Negative 08/31/2013 0312   Sepsis Labs: '@LABRCNTIP'$ (procalcitonin:4,lacticidven:4)  Recent Results (from the past 240 hour(s))  Blood Culture (routine x 2)     Status: None   Collection Time: 06/03/22  9:24 PM   Specimen: BLOOD  Result Value Ref Range Status   Specimen Description BLOOD BLOOD LEFT FOREARM  Final   Special Requests   Final    BOTTLES DRAWN AEROBIC AND ANAEROBIC Blood Culture adequate volume   Culture   Final    NO GROWTH 5 DAYS Performed at [facility], [address] [place], [place] [address]    Report Status 06/08/2022 FINAL  Final  Blood Culture (routine x 2)     Status: None   Collection Time: 06/03/22  9:26 PM   Specimen: BLOOD  Result Value Ref Range Status   Specimen Description BLOOD BLOOD RIGHT FOREARM  Final   Special Requests   Final    BOTTLES DRAWN AEROBIC AND ANAEROBIC Blood Culture results may not be optimal due to an inadequate volume of blood received in culture bottles   Culture   Final    NO GROWTH 5 DAYS Performed at [facility], [address] [place], [place] [address]    Report Status 06/08/2022 FINAL  Final  Urine Culture     Status: None   Collection Time: 06/04/22 12:20 AM   Specimen: Urine, Random  Result Value Ref Range Status   Specimen Description   Final    URINE, RANDOM Performed at [facility], [address] [place], [place] [address]    Special Requests   Final    NONE Performed at [facility], [address] [place], [place] [address]    Culture   Final    NO GROWTH Performed at [facility], [address], [place], [place] [address]    Report Status 06/05/2022 FINAL  Final  SARS Coronavirus 2 by RT PCR (hospital order, performed in [facility] hospital lab) *cepheid single result test*     Status: None   Collection Time: 06/04/22 12:20 AM   Specimen: Nasal Swab  Result Value Ref Range Status   SARS Coronavirus 2 by RT PCR NEGATIVE NEGATIVE Final    Comment: (NOTE) SARS-CoV-2 target nucleic acids are NOT DETECTED.  The SARS-CoV-2 RNA is generally detectable in upper and lower respiratory specimens during the acute phase of infection. The lowest concentration of SARS-CoV-2 viral copies this assay can detect is 250 copies / mL. A negative result does not preclude SARS-CoV-2 infection and should not be used as the sole basis for treatment or other patient management decisions.  A negative result may occur with improper specimen  collection / handling, submission of specimen other than nasopharyngeal swab, presence of viral mutation(s) within the areas targeted by this assay, and inadequate number of viral copies (<250 copies / mL). A negative result must be combined with clinical observations, patient history, and epidemiological information.  Fact Sheet for Patients:   https://www.patel.info/  Fact Sheet for Healthcare Providers: https://hall.com/  This test is not yet approved or  cleared by the [place] FDA and has been  authorized for detection and/or diagnosis of SARS-CoV-2 by FDA under an Emergency Use Authorization (EUA).  This EUA will remain in effect (meaning this test can be used) for the duration of the COVID-19 declaration under Section 564(b)(1) of the Act, 21 U.S.C. section 360bbb-3(b)(1), unless the authorization is terminated or revoked sooner.  Performed at [facility], [address] [place], [place] [address]          Radiology Studies: CT Soft Tissue Neck W Contrast  Result Date: 06/04/2022 CLINICAL DATA:  Hematologic malignancy EXAM: CT NECK WITH CONTRAST TECHNIQUE: Multidetector CT imaging of the neck was performed using the standard protocol following the bolus administration of intravenous contrast. RADIATION DOSE REDUCTION: This exam was performed according to the departmental dose-optimization program which includes automated exposure control, adjustment of the mA and/or kV according to patient size and/or use of iterative reconstruction technique. CONTRAST:  64m OMNIPAQUE IOHEXOL 350 MG/ML SOLN COMPARISON:  05/15/2022 FINDINGS: PHARYNX AND LARYNX: Rightward deviation of the pharynx and upper larynx is unchanged. There is persistent left asymmetric soft tissue thickening at the larynx. The airway is maintained. Post treatment thickening of the epiglottis. Unchanged small retropharyngeal effusion. SALIVARY GLANDS: Post  treatment hyperenhancement of the salivary glands is unchanged. THYROID: Normal. LYMPH NODES: Conglomerate nodal mass at left level 2A with areas of central necrosis is unchanged allowing for differences in scan quality and contrast bolus timing. 5 mm right level 1A node is also unchanged. No new lymphadenopathy. VASCULAR: There is a right chest wall Port-A-Cath the terminates below the field of view. The left internal jugular vein remains occluded. There is narrowing of the distal left ICA due to mass effect by the surrounding soft tissues, unchanged. LIMITED INTRACRANIAL: Normal. VISUALIZED ORBITS: Normal. MASTOIDS AND VISUALIZED PARANASAL SINUSES: No fluid levels or advanced mucosal thickening. No mastoid effusion. SKELETON: No bony spinal canal stenosis. No lytic or blastic lesions. UPPER CHEST: Clear. OTHER: Unchanged cutaneous lesion of the left retroauricular region. IMPRESSION: 1. Unchanged appearance of confluent nodal mass at left level 2A with areas of central necrosis. 2. Unchanged 5 mm right level 1A node. 3. Unchanged cutaneous lesion of the left retroauricular region. 4. 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 Electronically Signed   By: KUlyses JarredM.D.   On: 06/04/2022 00:18   CT HEAD WO CONTRAST (5MM)  Result Date: 06/03/2022 CLINICAL DATA:  Altered mental status, head and neck carcinoma EXAM: CT HEAD WITHOUT CONTRAST TECHNIQUE: Contiguous axial images were obtained from the base of the skull through the vertex without intravenous contrast. RADIATION DOSE REDUCTION: This exam was performed according to the departmental dose-optimization program which includes automated exposure control, adjustment of the mA and/or kV according to patient size and/or use of iterative reconstruction technique. COMPARISON:  MRI 05/16/2022 FINDINGS: Brain: Normal anatomic configuration. Parenchymal volume loss is commensurate with the patient's age. Mild periventricular white matter changes are  present likely reflecting the sequela of small vessel ischemia. No abnormal intra or extra-axial mass lesion or fluid collection. No abnormal mass effect or midline shift. No evidence of acute intracranial hemorrhage or infarct. Ventricular size is normal. Cerebellum unremarkable. Vascular: No asymmetric hyperdense vasculature at the skull base. Skull: Intact Sinuses/Orbits: Paranasal sinuses are clear. Orbits are unremarkable. Other: Mastoid air cells and middle ear cavities are clear. Left retroauricular soft tissue mass is partially visualized at the inferior margin of the examination, better assessed on CT examination of the neck on 05/15/2022. IMPRESSION: 1. No acute intracranial abnormality. 2. Left retroauricular  soft tissue mass partially visualized, better assessed on CT examination of the neck on 05/15/2022. Electronically Signed   By: Fidela Salisbury M.D.   On: 06/03/2022 22:12   DG Chest Port 1 View  Result Date: 06/03/2022 CLINICAL DATA:  Questionable sepsis EXAM: PORTABLE CHEST 1 VIEW COMPARISON:  05/15/2022 FINDINGS: The heart size and mediastinal contours are within normal limits. Right chest port catheter. Both lungs are clear. The visualized skeletal structures are unremarkable. IMPRESSION: No acute abnormality of the lungs in AP portable projection. Electronically Signed   By: Delanna Ahmadi M.D.   On: 06/03/2022 21:20            LOS: 9 days    Time spent: 50 mins    Emeterio Reeve, DO [facility] 06/13/2022, 10:49 AM   Staff may message me via secure chat in [place]  but this may not receive immediate response,  please page for urgent matters!  If 7PM-7AM, please contact night-coverage [email or web address]  Dictation software was used to generate the above note. Typos may occur and escape review, as with typed/written notes. Please contact Dr Sheppard Coil directly for clarity if needed.

## 2022-06-13 NOTE — Progress Notes (Signed)
Pt appears to be having more hemoptysis and blood from trach area. Notified Dr. Erin Fulling. No new orders given. Pt vss. No acute distress noetd.

## 2022-06-13 NOTE — Progress Notes (Signed)
PHARMACY CONSULT NOTE - FOLLOW UP  Pharmacy Consult for Electrolyte Monitoring and Replacement   Recent Labs: Potassium (mmol/L)  Date Value  06/13/2022 3.4 (L)  08/31/2013 3.7   Magnesium (mg/dL)  Date Value  06/13/2022 1.2 (L)   Calcium (mg/dL)  Date Value  06/13/2022 8.1 (L)   Calcium, Total (mg/dL)  Date Value  08/31/2013 8.8   Albumin (g/dL)  Date Value  06/09/2022 1.7 (L)  08/31/2013 3.9   Phosphorus (mg/dL)  Date Value  06/13/2022 3.7   Sodium (mmol/L)  Date Value  06/13/2022 130 (L)  08/31/2013 135 (L)     Assessment: 8/15 @ 0428 :    K = 3.4,  Mag = 1.2  Goal of Therapy:  Electrolytes WNL   Plan:  Will order KCl 10 mEq IV X 2 and Magnesium 4 gm IV X 1. Will recheck electrolytes on 8/16 with AM labs.   Orene Desanctis ,PharmD Clinical Pharmacist 06/13/2022 6:15 AM

## 2022-06-13 NOTE — Progress Notes (Signed)
Physical Therapy Treatment Patient Details Name: Christina Porter R. Christina Porter MRN: [id number] DOB: 1961-04-10 Today's Date: 06/13/2022   History of Present Illness Pt is a 61 y/o F admitted on 06/03/22 after presenting to the ED with c/o SOB & confusion. 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 Pt developed acute respiratory distress 2/2 hemoptysis requiring emergent tracheostomy for airway protection, carotid artery emobilization & carotid stent placement. 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 The patient required Min A for transfers this session. Multiple standing bouts performed with standing tolerance of around 1 minute. Vitals remained stable throughout session. Encouraged frequent repositioning in the bed for skin integrity. Recommend to continue PT to maximize independence and facilitate return to prior level of function.    Recommendations for follow up therapy are one component of a multi-disciplinary discharge planning process, led by the attending physician.  Recommendations may be updated based on patient status, additional functional criteria and insurance authorization.  Follow Up Recommendations  Acute inpatient rehab (3hours/day)     Assistance Recommended at Discharge Frequent or constant Supervision/Assistance  Patient can return home with the following A lot of help with walking and/or transfers;A lot of help with bathing/dressing/bathroom;Assist for transportation;Assistance with cooking/housework;Help with stairs or ramp for entrance   Equipment Recommendations  None recommended by PT    Recommendations for Other Services       Precautions /  Restrictions Precautions Precautions: Fall Restrictions Weight Bearing Restrictions: No     Mobility  Bed Mobility Overal bed mobility: Needs Assistance Bed Mobility: Supine to Sit, Sit to Supine     Supine to sit: Min assist Sit to supine: Supervision   General bed mobility comments: assistance for trunk support to sit upright. verbal cues for technique and sequencing. increased time and effort required    Transfers Overall transfer level: Needs assistance Equipment used: 1 person hand held assist Transfers: Bed to chair/wheelchair/BSC, Sit to/from Stand Sit to Stand: Min assist   Step pivot transfers: Min assist       General transfer comment: verbal cues for hand placement and technique. multiple standing bouts performed from bed and from bed side commode.    Ambulation/Gait               General Gait Details: not assessed due to patient wanting to return to bed. anticipate patient can ambulate with assistance based on standing tolerance   Stairs             Wheelchair Mobility    Modified Rankin (Stroke Patients Only)       Balance Overall balance assessment: Needs assistance Sitting-balance support: Feet supported Sitting balance-Leahy Scale: Fair     Standing balance support: Single extremity supported Standing balance-Leahy Scale: Poor Standing balance comment: static standing required only Min guard assistance. with dynamic activity, patient required moderate assistance to maintain standing balance while performing peri-care. standing tolerance was ~ 1 minute                            Cognition Arousal/Alertness: Awake/alert Behavior During Therapy: WFL for tasks assessed/performed Overall Cognitive Status: Difficult to assess  General Comments: patient is able to follow single step commands consistently without difficulty        Exercises      General Comments General  comments (skin integrity, edema, etc.): vitals remained stable throughout session. educated patient on the importance of routine repositioning in  bed to prevent against skin breakdown and she nodding yes in understanding      Pertinent Vitals/Pain Pain Assessment Pain Assessment: Faces Faces Pain Scale: Hurts little more Pain Location: L side of face & neck indicated by pointing Pain Descriptors / Indicators: Discomfort Pain Intervention(s): Limited activity within patient's tolerance, Monitored during session    Home Living                          Prior Function            PT Goals (current goals can now be found in the care plan section) Acute Rehab PT Goals Patient Stated Goal: none stated PT Goal Formulation: With patient Time For Goal Achievement: 06/26/22 Potential to Achieve Goals: Good Progress towards PT goals: Progressing toward goals    Frequency    7X/week      PT Plan Current plan remains appropriate    Co-evaluation              AM-PAC PT "6 Clicks" Mobility   Outcome Measure  Help needed turning from your back to your side while in a flat bed without using bedrails?: A Little Help needed moving from lying on your back to sitting on the side of a flat bed without using bedrails?: A Little Help needed moving to and from a bed to a chair (including a wheelchair)?: A Little Help needed standing up from a chair using your arms (e.g., wheelchair or bedside chair)?: A Little Help needed to walk in hospital room?: A Lot Help needed climbing 3-5 steps with a railing? : A Lot 6 Click Score: 16    End of Session   Activity Tolerance: Patient tolerated treatment well Patient left: in bed;with call bell/phone within reach;with nursing/sitter in room Nurse Communication: Mobility status PT Visit Diagnosis: Unsteadiness on feet (R26.81);Muscle weakness (generalized) (M62.81);Other abnormalities of gait and mobility (R26.89)     Time:  6314-9702 PT Time Calculation (min) (ACUTE ONLY): 18 min  Charges:  $Therapeutic Activity: 8-22 mins                     Christina Porter, PT, MPT    Christina Porter 06/13/2022, 10:45 AM

## 2022-06-13 NOTE — Progress Notes (Signed)
OT Cancellation Note  Patient Details Name: Christina Porter [name] MRN: [id number] DOB: Mar 25, 1961   Cancelled Treatment:    Reason Eval/Treat Not Completed: Patient declined, no reason specified. Pt supine in bed and nods head "no" when asked to participate in OT intervention. OT will re-attempt at next available time.   Darleen Crocker, MS, OTR/L , CBIS ascom [phone]  06/13/22, 3:13 PM

## 2022-06-13 NOTE — Progress Notes (Addendum)
SLP Cancellation Note  Patient Details Name: Christina Porter R. Alwilda Gilland MRN: [id number] DOB: 1960-11-23   Cancelled treatment:       Reason Eval/Treat Not Completed: Medical issues which prohibited therapy;Patient not medically ready (chart reviewed) Per review of NSG notes/discussion, pt continues to experience hemoptysis seeming to arise more from mouth (per MD note); this is being addressed by MD/Team. Early this morning, pt's O2 sats dropped to 84%. NSG addressed w/ increased pt to FIO2 to 100% via trach collar. Paged RT who suctioned pt trach and met resistance. Catheter did pass and multiple clots removed. Pt then spit up a grape sized blood clot. Per pt's current medical status, including ENT's concern of vocal cord paralysis and edema, pt would not be appropriate for PMV assessment until she is free of all bleeding from tracheostomy and oral areas. Pharyngeal swallowing is felt to be unsafe currently d/t the above presentation. Pt has a PEG placement for her nutrition needs. Recommend frequent oral care for hygiene and stimulation of swallowing. Request MD to reconsult ST services when pt's medical status is appropriate for PMV assessment. NSG updated, agreed.     Orinda Kenner, MS, CCC-SLP Speech Language Pathologist Rehab Services; [place] [phone] (ascom) Kendric Sindelar 06/13/2022, 2:49 PM

## 2022-06-13 NOTE — Progress Notes (Addendum)
Spoke w/ son Christina Porter. Will be meeting w/ him tomorrow when he is here to visit around noon.   Discussed unable to transfer to [facility]. Passed along Dr Harvie Junior suggestion to offer [facility] or other tertiary care, son declined this.   He stated "I heard if we just take her to the ER at [facility] she could get a bed that way." I let him know that [facility] cannot facilitate that process to send to the ER since that is technically a discharge/downgrade in care. Would prefer hospital to hospital transfer which we have been unable to arrange. If he wanted to take his mom to [facility] ER himself, that is his right and hers if they decide they want to leave this facility, but that would be against medical advice because we cannot in good conscience safely discharge her with appropriate support at this time. I let him know we can arrange hospice services or attempt to discharge her when more stable w/ home health but given her frequent bleeding I am not comfortable at this time discharging her.   I will discuss more with him tomorrow and he voiced understanding. Overall pleasant conversation and I look forward to meeting him tomorrow.

## 2022-06-13 NOTE — Progress Notes (Signed)
Pt O2 sat dropped to 84%. Increased pt FIO2 to 100% via trach collar. Paged RT. Suctioned pt trach and met resistance. Catheter did pass and multiple clots removed. Pt then spit up a grape sized blood clot. Informed MD Dewald of this. Pt is caox4. O2 sats returned to normal once suction was complete and no further distress noted. No further orders given.  

## 2022-06-13 NOTE — Progress Notes (Signed)
Case discussed with ENT, hospitalist.  Her hemoptysis seems to be arising more from mouth.  Will do TXA nebs via mouth instead of trach to see if this eases up.  Primary to reach out to [facility] onc to see if anything else can be done.  Erskine Emery

## 2022-06-13 NOTE — Progress Notes (Signed)
ID Pt is in ICU Had hemoptysis but that was from mouth  and underwent  urgent tracheostomy and carotid artery embolization /stent on 06/09/22  BP 103/66   Pulse (!) 106   Temp 98.6 F (37 C) (Oral)   Resp 14   Ht '5\' 2"'$  (1.575 m)   Wt 39.8 kg   SpO2 98%   BMI 16.05 kg/m   Tracheostomy on oxygen Bloody secretions from tracheostomy site Left neck fungating mass B/l air entry- rhonchi Labs    Latest Ref Rng & Units 06/13/2022    4:28 AM 06/12/2022    3:21 AM 06/11/2022    1:41 PM  CBC  WBC 4.0 - 10.5 K/uL 16.5  12.6    Hemoglobin 12.0 - 15.0 g/dL 9.1  9.1  10.3   Hematocrit 36.0 - 46.0 % 27.5  27.1  30.4   Platelets 150 - 400 K/uL 481  437         Latest Ref Rng & Units 06/13/2022    4:28 AM 06/12/2022    3:21 AM 06/11/2022    2:08 AM  CMP  Glucose 70 - 99 mg/dL 143  108  118   BUN 8 - 23 mg/dL '13  6  7   '$ Creatinine 0.44 - 1.00 mg/dL 0.41  0.60  0.60   Sodium 135 - 145 mmol/L 130  133  139   Potassium 3.5 - 5.1 mmol/L 3.4  3.4  3.3   Chloride 98 - 111 mmol/L 97  101  103   CO2 22 - 32 mmol/L '25  27  27   '$ Calcium 8.9 - 10.3 mg/dL 8.1  8.1  7.9     Impression/recommendation 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  Also extension into soft tissue of the neck with obstruction of left IJ and also compression of the ICA. 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 Patient leukocytosis secondary to the fungating mass as well as aspiration. On unasyn- may change to peg augmentin if leucocytosis stable   Acute hypoxic respiratory failure secondary to the above   COPD  Current smoker  Anemia ? Discussed the management with the care team

## 2022-06-14 DIAGNOSIS — J9601 Acute respiratory failure with hypoxia: Secondary | ICD-10-CM | POA: Diagnosis not present

## 2022-06-14 DIAGNOSIS — C140 Malignant neoplasm of pharynx, unspecified: Secondary | ICD-10-CM | POA: Diagnosis not present

## 2022-06-14 DIAGNOSIS — J690 Pneumonitis due to inhalation of food and vomit: Secondary | ICD-10-CM | POA: Diagnosis not present

## 2022-06-14 DIAGNOSIS — I82C12 Acute embolism and thrombosis of left internal jugular vein: Secondary | ICD-10-CM | POA: Diagnosis not present

## 2022-06-14 DIAGNOSIS — C760 Malignant neoplasm of head, face and neck: Secondary | ICD-10-CM | POA: Diagnosis not present

## 2022-06-14 LAB — GLUCOSE, CAPILLARY
Glucose-Capillary: 107 mg/dL — ABNORMAL HIGH (ref 70–99)
Glucose-Capillary: 123 mg/dL — ABNORMAL HIGH (ref 70–99)
Glucose-Capillary: 126 mg/dL — ABNORMAL HIGH (ref 70–99)
Glucose-Capillary: 96 mg/dL (ref 70–99)
Glucose-Capillary: 109 mg/dL — ABNORMAL HIGH (ref 70–99)

## 2022-06-14 LAB — BASIC METABOLIC PANEL
Anion gap: 5 (ref 5–15)
BUN: 17 mg/dL (ref 8–23)
CO2: 26 mmol/L (ref 22–32)
Calcium: 8 mg/dL — ABNORMAL LOW (ref 8.9–10.3)
Chloride: 102 mmol/L (ref 98–111)
Creatinine, Ser: 0.46 mg/dL (ref 0.44–1.00)
GFR, Estimated: >60 mL/min (ref >60)
Glucose, Bld: 107 mg/dL — ABNORMAL HIGH (ref 70–99)
Potassium: 3.9 mmol/L (ref 3.5–5.1)
Sodium: 133 mmol/L — ABNORMAL LOW (ref 135–145)

## 2022-06-14 LAB — PHOSPHORUS: Phosphorus: 3.3 mg/dL (ref 2.5–4.6)

## 2022-06-14 LAB — MAGNESIUM: Magnesium: 1.8 mg/dL (ref 1.7–2.4)

## 2022-06-14 MED ORDER — MORPHINE SULFATE 10 MG/5ML PO SOLN
10.0000 mg | Freq: Four times a day (QID) | ORAL | Status: DC
  Administered 2022-06-14 – 2022-06-18 (×17): 10 mg
  Filled 2022-06-14 (×18): qty 5

## 2022-06-14 MED ORDER — MAGNESIUM SULFATE 2 GM/50ML IV SOLN
2.0000 g | Freq: Once | INTRAVENOUS | Status: AC
  Administered 2022-06-14: 2 g via INTRAVENOUS
  Filled 2022-06-14: qty 50

## 2022-06-14 MED ORDER — MORPHINE SULFATE 10 MG/5ML PO SOLN
10.0000 mg | ORAL | Status: DC | PRN
  Administered 2022-06-17 – 2022-06-18 (×5): 10 mg
  Filled 2022-06-14 (×6): qty 5

## 2022-06-14 MED ORDER — TRANEXAMIC ACID FOR INHALATION
500.0000 mg | Freq: Three times a day (TID) | RESPIRATORY_TRACT | Status: DC
  Administered 2022-06-14 – 2022-06-15 (×4): 500 mg via RESPIRATORY_TRACT
  Filled 2022-06-14 (×4): qty 10
  Filled 2022-06-14: qty 5

## 2022-06-14 NOTE — Consult Note (Signed)
[facility]   Recent Labs: Potassium (mmol/L)  Date Value  06/14/2022 3.9  08/31/2013 3.7   Magnesium (mg/dL)  Date Value  06/14/2022 1.8   Calcium (mg/dL)  Date Value  06/14/2022 8.0 (L)   Calcium, Total (mg/dL)  Date Value  08/31/2013 8.8   Albumin (g/dL)  Date Value  06/09/2022 1.7 (L)  08/31/2013 3.9   Phosphorus (mg/dL)  Date Value  06/14/2022 3.3   Sodium (mmol/L)  Date Value  06/14/2022 133 (L)  08/31/2013 135 (L)   Assessment: Patient is a 61 y/o F with medical history including cancer of the soft palate with local metastasis on chronic opioids, 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 She underwent tracheostomy as well as coil embolization and stenting for hemorrhage from left carotid artery on 8/11. Pharmacy consulted to assist with electrolyte monitoring and replacement while patient is in the ICU.  Patient has a PEG tube MIVF: NS at 75 cc/hr I&O: + 8L  Goal of Therapy:  Electrolytes within normal limits  Plan:  --Mg 1.8, magnesium sulfate 2 g IV x 1 dose --Follow-up electrolytes with AM labs tomorrow   Benita Gutter 06/14/2022 8:20 AM

## 2022-06-14 NOTE — Progress Notes (Signed)
OT Cancellation Note  Patient Details Name: Christina Porter R. Giah Fickett MRN: [id number] DOB: 10-Dec-1960   Cancelled Treatment:    Reason Eval/Treat Not Completed: Other (comment). OT arriving to work with pt but she is currently being transferred to different unit. She appears to be clutching cloth to face for drainage from mouth. OT will re-attempt when pt is next available.   Darleen Crocker, MS, OTR/L , CBIS ascom [phone]  06/14/22, 3:29 PM

## 2022-06-14 NOTE — Progress Notes (Signed)
Physical Therapy Treatment Patient Details Name: Christina Porter MRN: [id number] DOB: Jun 10, 1961 Today's Date: 06/14/2022   History of Present Illness Pt is a 61 y/o F admitted on 06/03/22 after presenting to the ED with c/o SOB & confusion. 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 Pt developed acute respiratory distress 2/2 hemoptysis requiring emergent tracheostomy for airway protection, carotid artery emobilization & carotid stent placement. 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 She wants to try to walk today. The patient ambulated 140f in hallway with rolling walker on 4L02 via trach mask. Heart rate briefly up to 110bpm and Sp02 remains in the high 90's with activity. The patient does complain mostly about left face and neck pain. She is requiring only Min guard assistance with activity at this time. The patient indicated to me that she wants to go home at discharge. Anticipated home discharge is reasonable if patient has family support. PT will continue to follow to maximize independence and decrease caregiver burden.    Recommendations for follow up therapy are one component of a multi-disciplinary discharge planning process, led by the attending physician.  Recommendations may be updated based on patient status, additional functional criteria and insurance authorization.  Follow Up Recommendations  Home health PT     Assistance Recommended at Discharge Intermittent Supervision/Assistance  Patient can return home with the following A little help with walking and/or transfers;A little help with bathing/dressing/bathroom;Assist for transportation;Help  with stairs or ramp for entrance;Assistance with cooking/housework   Equipment Recommendations   (ongoing assessment)    Recommendations for Other Services       Precautions / Restrictions Precautions Precautions: Fall Precaution Comments: trach, PEG Restrictions Weight Bearing Restrictions: No     Mobility  Bed Mobility Overal bed mobility: Needs Assistance Bed Mobility: Supine to Sit, Sit to Supine     Supine to sit: Min guard Sit to supine: Supervision   General bed mobility comments: verbal cues for sequencing and technique    Transfers Overall transfer level: Needs assistance Equipment used: Rolling walker (2 wheels) Transfers: Sit to/from Stand Sit to Stand: Min guard           General transfer comment: Min guard for safety due to multiple lines/leads. cues for safety    Ambulation/Gait Ambulation/Gait assistance: Min guard Gait Distance (Feet): 110 Feet Assistive device: Rolling walker (2 wheels) Gait Pattern/deviations: Step-through pattern       General Gait Details: verbal cues for safety and to keep rolling walker closer to base of support. occasional cues to decrease speed for safety due to multiple lines/leads/oxygen. heart rate up to 110 with walking and Sp02 remains in the high 90's on 4 L02 via trach mask at 28% Fi02. patient's biggest complaint with mobility was left face/neck pain and she asked for pain meds while walking   Stairs             Wheelchair Mobility    Modified Rankin (Stroke Patients Only)       Balance Overall balance assessment: Needs assistance Sitting-balance support: Feet supported Sitting balance-Leahy Scale: Fair     Standing balance support: Bilateral upper extremity supported Standing balance-Leahy Scale:  Fair Standing balance comment: with RW for UE support, no further external support is required                            Cognition Arousal/Alertness: Awake/alert Behavior During  Therapy: WFL for tasks assessed/performed Overall Cognitive Status: Difficult to assess                                 General Comments: patient is able to follow single step commands consistently        Exercises      General Comments General comments (skin integrity, edema, etc.): respiratory therapist set-up the trach mask prior to ambulating with 02 tank at 3-4 L recommended      Pertinent Vitals/Pain Pain Assessment Pain Assessment: Faces Faces Pain Scale: Hurts little more Pain Location: L side of face & neck indicated by pointing Pain Descriptors / Indicators: Discomfort Pain Intervention(s): Limited activity within patient's tolerance, Monitored during session, Patient requesting pain meds-RN notified    Home Living                          Prior Function            PT Goals (current goals can now be found in the care plan section) Acute Rehab PT Goals Patient Stated Goal: none stated PT Goal Formulation: With patient Time For Goal Achievement: 06/26/22 Potential to Achieve Goals: Good Progress towards PT goals: Progressing toward goals    Frequency    7X/week      PT Plan Discharge plan needs to be updated    Co-evaluation              AM-PAC PT "6 Clicks" Mobility   Outcome Measure  Help needed turning from your back to your side while in a flat bed without using bedrails?: A Little Help needed moving from lying on your back to sitting on the side of a flat bed without using bedrails?: A Little Help needed moving to and from a bed to a chair (including a wheelchair)?: A Little Help needed standing up from a chair using your arms (e.g., wheelchair or bedside chair)?: A Little Help needed to walk in hospital room?: A Little Help needed climbing 3-5 steps with a railing? : A Little 6 Click Score: 18    End of Session Equipment Utilized During Treatment: Oxygen Activity Tolerance: Patient tolerated treatment  well Patient left: in bed;with call bell/phone within reach;with nursing/sitter in room Nurse Communication: Mobility status;Patient requests pain meds (the nurse was present for the treatment session) PT Visit Diagnosis: Unsteadiness on feet (R26.81);Muscle weakness (generalized) (M62.81);Other abnormalities of gait and mobility (R26.89)     Time: 1275-1700 PT Time Calculation (min) (ACUTE ONLY): 27 min  Charges:  $Gait Training: 8-22 mins $Therapeutic Activity: 8-22 mins                     Minna Merritts, PT, MPT    Christina Porter 06/14/2022, 11:35 AM

## 2022-06-14 NOTE — Progress Notes (Signed)
Met w/ patient and son Goal: discharge home and outpatient follow up with oncology at [facility] Discussed how we can meet that goal including Pain control --> morphine calculate MME, plan home regimen, son had questions about fentanyl Family education on home DME --> TOC following, may need to plan meeting for son and his sister Matilde Haymaker [phone]) can receive education Deliver/setup home DME - will need O2, Trach care supplies possibly suction, hospital bed, tube feed supplies, likely walker, bedside commode, etc Plan for if bleeding or severe illness: discussed this would be emergency and call 911, they would bring her here / closest facility. Advised patient and her son that if she bleeds and medical team is unable to stop/replace bleeding and her heart stops due to blood loss, CPR will not benefit her and would not recommend CPR in that scenario due to unethical harm/pain without benefit. They will think about this more. FULL CODE for now.  Plan for if otherwise sick w/ less severe symptoms: son states he would take her to [facility] ER even if it was farther away despite risks of traveling  Time spent: 35 min

## 2022-06-14 NOTE — Progress Notes (Signed)
Inpatient Rehab Admissions Coordinator:   Pt mobilizing exceptionally well and PT recommendations updated to home with home health f/u.  No CIR needs at this time.   Shann Medal, PT, DPT Admissions Coordinator [phone] 06/14/22  3:05 PM

## 2022-06-14 NOTE — Progress Notes (Signed)
ID Back on the floor Stable  O/e awake Patient Vitals for the past 24 hrs:  BP Temp Temp src Pulse Resp SpO2 Weight  06/14/22 0700 -- -- -- -- -- 100 % --  06/14/22 0624 -- -- -- -- -- 100 % --  06/14/22 0500 111/68 -- -- 97 16 -- 41.8 kg  06/14/22 0400 95/62 98.6 F (37 C) Oral 98 15 -- --  06/14/22 0300 97/64 -- -- 94 16 -- --  06/14/22 0200 94/61 -- -- 98 18 -- --  06/14/22 0100 112/61 -- -- (!) 116 (!) 22 -- --  06/14/22 0000 103/64 -- -- 92 16 -- --  06/13/22 2300 100/66 -- -- 98 14 -- --  06/13/22 2200 100/67 -- -- 98 19 -- --  06/13/22 2100 108/67 -- -- (!) 104 17 -- --  06/13/22 2000 119/71 -- -- (!) 107 (!) 22 -- --  06/13/22 1904 -- -- -- (!) 114 18 -- --  06/13/22 1600 102/61 -- -- 99 17 -- --  06/13/22 1500 116/68 -- -- (!) 108 19 -- --  06/13/22 1430 -- -- -- (!) 108 15 98 % --  06/13/22 1400 103/66 -- -- (!) 106 14 -- --  06/13/22 1300 -- -- -- (!) 103 18 -- --  06/13/22 1218 -- -- -- (!) 115 (!) 24 97 % --  06/13/22 1200 -- 98.6 F (37 C) Oral 100 16 -- --   No resp distress Minimal bloody  secretions from tracheostomy site Left neck fungating mass B/l air entry- rhonchi Labs    Latest Ref Rng & Units 06/13/2022    4:28 AM 06/12/2022    3:21 AM 06/11/2022    1:41 PM  CBC  WBC 4.0 - 10.5 K/uL 16.5  12.6    Hemoglobin 12.0 - 15.0 g/dL 9.1  9.1  10.3   Hematocrit 36.0 - 46.0 % 27.5  27.1  30.4   Platelets 150 - 400 K/uL 481  437         Latest Ref Rng & Units 06/14/2022    5:44 AM 06/13/2022    4:28 AM 06/12/2022    3:21 AM  CMP  Glucose 70 - 99 mg/dL 107  143  108   BUN 8 - 23 mg/dL '17  13  6   '$ Creatinine 0.44 - 1.00 mg/dL 0.46  0.41  0.60   Sodium 135 - 145 mmol/L 133  130  133   Potassium 3.5 - 5.1 mmol/L 3.9  3.4  3.4   Chloride 98 - 111 mmol/L 102  97  101   CO2 22 - 32 mmol/L '26  25  27   '$ Calcium 8.9 - 10.3 mg/dL 8.0  8.1  8.1     Impression/recommendation Extensive 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  Also extension into soft tissue of the neck with obstruction of left IJ and also compression of the ICA. 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 Patient leukocytosis secondary to the fungating mass as well as aspiration. On unasyn- may change to peg augmentin once leucocytosis resolve- because of extensive necrotic tumor in the mouth risk for infection and may need  long term Po antibiotic   Acute hypoxic respiratory failure secondary to the above   COPD  Current smoker  Anemia ? Discussed the management with the care team

## 2022-06-14 NOTE — Progress Notes (Signed)
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 Augmentin with plan for follow-up at [facility], with radiation starting on 05/30/2022.  She was seen in [facility] ED on 06/02/2022 for hypoxia and shortness of breath due to concern for aspiration pneumonia, unfortunately she left AMA.  She later presented to [facility] ED on the same day, but also left AMA. Presented again to [facility] ED 06/03/2022 w/ tachycardia/tachypnea.   Patient was admitted for sepsis secondary to possible UTI vs neck mass, started on IV fluids and IV antibiotics. 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   8/6: Admitted by Hospitalist 8/7: ENT & ID consulted. 8/8: Oncology consulted. Concern for aspiration 8/9: Voice becoming hoarse. Palliative Care consulted 8/10: Some SOB noted. Vascular Surgery consulted. 8/11: Rapid response called due to recurrent bleeding with development of acute respiratory distress.  ENT performed emergent Tracheostomy, Vascular Surgery performed coil embolization of first 3 external carotid artery branches and placed Left Carotid Stent.  PCCM consulted for vent management 8/12 attempted SBT, failed initial attempt secondary to tachypnea and hypoxia.  Later able to tolerate TC  8/13: Received 1 unit pRBC's overnight. Tolerating TC  trials 8/15: transfer back to hospitalist service. D/w [name] see below. Spoke w/ son, see separate progress note, plan for meeting tomorrow. See progress note from 8/15 re d/w oncology team and ENT at [facility].  8/16: ambulating, urinating, tolerating trach w/ O2 support. Son has not shown up as of 3 pm. Pt appears to be progressing, will consult TOC for DME/HH if we can arrange this and no further bleeding may be appropriate to go home if patient ans her son are amenable   GOC: Per palliative care note 08/14 and c/w multiple previous discussions w/ palliative care: oldest son is her preferred decision-maker. "She indicates  that she will undergo whatever is needed to try to add every single extra day she can get on this earth... Full code and full scope. Patient would like any and all care indicated for as long as possible regardless of pain or suffering, without limit."  Since patient follows up with hem-onc at [facility] (family is hopeful for treatment there to be helpful), her oncologist Dr Harriette Ohara at [facility] 06/06/2022 reported they would accept patient if/when bed available. Multiple attempts have been made to transfer the patient to [facility] but no bed available.     Consultants:  ID ENT Oncology Pulmonology Palliative Vascular surgery  PCCU  Procedures: 08/11 ENT performed emergent Tracheostomy, Vascular Surgery performed coil embolization of first 3 external carotid artery branches and placed Left Carotid Stent.  PCCM consulted for vent management    Subjective: Patient unable to verbally participate in history, she shrugs her      ASSESSMENT & PLAN:   Principal Problem:   Acute respiratory failure with hypoxia ([place]) Active Problems:   SIRS (systemic inflammatory response  syndrome) ([place])   Throat cancer ([place])   Internal jugular vein thrombosis, left (HCC)   Possible Seizure disorder ([place])   Bipolar 1 disorder (HCC)   Tobacco abuse   Hyponatremia   Abnormal urinalysis   Cancer associated  pain   Underweight   Head and neck cancer (HCC)   Pressure injury of skin   Aspiration pneumonia ([place])   Acute hypoxemic respiratory failure due to aspiration of blood in setting of extensive oropharyngeal cancer. Improved. S/P coil embolization of external carotid arteries, ICA stent placement, and emergent tracheostomy 8/11. O2 support and nebs to continue. 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   On palliative intent clinical trial regimen but treatment has not been started yet. See hospital course for communication w/ Onc and ENT at [facility].  On hold for acute illness. Plan family discussion today 06/14/22. Post trach care per ENT   Left IJ vein thrombosis-  related to tumor, AC contraindicated with ABLA at present [facility] for now, monitor CBC Continue DAPT   Hypokalemia-  repleted, pharmacy following   Sacral pressure ulcer POA Local care/turning for sacral wound  Chronic opiate dependence with acute pain  PRN morphine to standing morphine   Debility PT/OT Currently DNR.    Diet/type: NPO, tube feeds DVT prophylaxis: SCD GI prophylaxis: PPI Lines: Central line (Port-a-cath, present on admission) Foley:  will see if can get removed Code Status:  full code Family Communication: 06/12/22: ICU attending called son Barbette Reichmann, discussed trying to get her to point of OP [name] onc f/u; he agrees.  Hospitalist spoke w/ son 08/15 see progress note, plan family meeting today 06/14/22 and Dr Tamala Julian (ICU) also aware will be meeting Disposition Plan / TOC needs: ambulating now and no additional bleeding, may be appropriate to go home w/ home health if patient/son amenable. Remains inpatient for now.           Objective: Vitals:   06/14/22 1300 06/14/22 1400 06/14/22 1425 06/14/22 1500  BP: 106/67 105/70  107/64  Pulse: 97 (!) 106 94 (!)  119  Resp: '18 18 16 20  '$ Temp:      TempSrc:      SpO2:   99%   Weight:      Height:        Intake/Output Summary (Last 24 hours) at 06/14/2022 1528 Last data filed at 06/14/2022 0200 Gross per 24 hour  Intake 1109.05 ml  Output 1400 ml  Net -290.95 ml   Filed Weights   06/12/22 0412 06/13/22 0500 06/14/22 0500  Weight: 40.9 kg 39.8 kg 41.8 kg    Examination:  Constitutional:  VS as above General Appearance: alert, frail, NAD Neck: Bandage on place L neck over cancer site, trach in place Respiratory: Normal respiratory effort Coarse breath sounds No rales Cardiovascular: S1/S2 normal Tachycardic No lower extremity edema Gastrointestinal: No tenderness Musculoskeletal:  No clubbing/cyanosis of digits Symmetrical movement in all extremities Neurological: Alert Psychiatric: Fair judgment/insight Flat mood and affect       Scheduled Medications:   aspirin  81 mg Per Tube Daily   Chlorhexidine Gluconate Cloth  6 each Topical Daily   clopidogrel  75 mg Per Tube Daily   free water  30 mL Per Tube Q4H   gabapentin  600 mg Per Tube TID   morphine CONCENTRATE  10 mg Per Tube Q6H  nutrition supplement (JUVEN)  1 packet Per Tube BID BM   mouth rinse  15 mL Mouth Rinse 4 times per day   pantoprazole  40 mg Per Tube Daily   polyethylene glycol  17 g Per Tube Daily   QUEtiapine  100 mg Per Tube QHS   sodium chloride flush  10-40 mL Intracatheter Q12H   tranexamic acid  500 mg Nebulization Q8H   traZODone  100 mg Per Tube QHS    Continuous Infusions:  sodium chloride 250 mL (06/09/22 1359)   sodium chloride 75 mL/hr at 06/14/22 0813   ampicillin-sulbactam (UNASYN) IV 3 g (06/14/22 1245)   feeding supplement (OSMOLITE 1.2 CAL) 55 mL/hr at 06/13/22 2300    PRN Medications:  acetaminophen (TYLENOL) oral liquid 160 mg/5 mL, acetaminophen **OR** acetaminophen, lidocaine, morphine CONCENTRATE, naLOXone (NARCAN)  injection, ondansetron **OR** ondansetron (ZOFRAN)  IV, mouth rinse, sodium chloride, sodium chloride flush  Antimicrobials:  Anti-infectives (From admission, onward)    Start     Dose/Rate Route Frequency Ordered Stop   06/09/22 0748  ceFAZolin (ANCEF) IVPB 2g/100 mL premix  Status:  Discontinued        2 g 200 mL/hr over 30 Minutes Intravenous 30 min pre-op 06/09/22 0748 06/09/22 0914   06/08/22 0600  Ampicillin-Sulbactam (UNASYN) 3 g in sodium chloride 0.9 % 100 mL IVPB        3 g 200 mL/hr over 30 Minutes Intravenous Every 6 hours 06/07/22 1547     06/06/22 2200  fluconazole (DIFLUCAN) IVPB 200 mg  Status:  Discontinued        200 mg 100 mL/hr over 60 Minutes Intravenous Every 24 hours 06/05/22 1622 06/10/22 1506   06/05/22 2200  piperacillin-tazobactam (ZOSYN) IVPB 3.375 g        3.375 g 12.5 mL/hr over 240 Minutes Intravenous Every 8 hours 06/05/22 1622 06/07/22 2359   06/05/22 1800  fluconazole (DIFLUCAN) IVPB 400 mg        400 mg 100 mL/hr over 120 Minutes Intravenous  Once 06/05/22 1622 06/07/22 0207   06/05/22 1545  Ampicillin-Sulbactam (UNASYN) 3 g in sodium chloride 0.9 % 100 mL IVPB  Status:  Discontinued        3 g 200 mL/hr over 30 Minutes Intravenous Every 6 hours 06/05/22 1450 06/05/22 1622   06/04/22 0115  cefTRIAXone (ROCEPHIN) 2 g in sodium chloride 0.9 % 100 mL IVPB  Status:  Discontinued        2 g 200 mL/hr over 30 Minutes Intravenous Every 24 hours 06/04/22 0102 06/05/22 1449   06/04/22 0045  cefTRIAXone (ROCEPHIN) 1 g in sodium chloride 0.9 % 100 mL IVPB  Status:  Discontinued        1 g 200 mL/hr over 30 Minutes Intravenous  Once 06/04/22 0030 06/04/22 0109       Data Reviewed: I have personally reviewed following labs and imaging studies  CBC: Recent Labs  Lab 06/09/22 1629 06/09/22 2046 06/10/22 0201 06/10/22 0851 06/10/22 2026 06/11/22 0208 06/11/22 1341 06/12/22 0321 06/13/22 0428  WBC 16.4*  --  12.5*  --   --  11.9*  --  12.6* 16.5*  NEUTROABS  --   --   --   --   --   --   --   --   14.0*  HGB 11.3*   < > 8.7*   < > 7.4* 10.0* 10.3* 9.1* 9.1*  HCT 32.4*   < > 26.3*   < > 22.3* 29.5* 30.4*  27.1* 27.5*  MCV 89.8  --  91.6  --   --  89.7  --  90.0 91.7  PLT 463*  --  471*  --   --  401*  --  437* 481*   < > = values in this interval not displayed.   Basic Metabolic Panel: Recent Labs  Lab 06/08/22 0454 06/09/22 0701 06/10/22 0201 06/11/22 0208 06/12/22 0321 06/13/22 0428 06/14/22 0544  NA 129*   < > 136 139 133* 130* 133*  K 3.6   < > 3.2* 3.3* 3.4* 3.4* 3.9  CL 92*   < > 99 103 101 97* 102  CO2 30   < > '29 27 27 25 26  '$ GLUCOSE 131*   < > 147* 118* 108* 143* 107*  BUN 7*   < > 7* 7* 6* 13 17  CREATININE <0.30*   < > 0.51 0.60 0.60 0.41* 0.46  CALCIUM 8.3*   < > 7.9* 7.9* 8.1* 8.1* 8.0*  MG 1.4*  --  1.4* 2.0  --  1.2* 1.8  PHOS 2.5  --  3.1 2.5  --  3.7 3.3   < > = values in this interval not displayed.   GFR: Estimated Creatinine Clearance: 48.7 mL/min (by C-G formula based on SCr of 0.46 mg/dL). Liver Function Tests: Recent Labs  Lab 06/09/22 0701  AST 23  ALT 16  ALKPHOS 86  BILITOT 0.3  PROT 5.0*  ALBUMIN 1.7*   No results for input(s): "LIPASE", "AMYLASE" in the last 168 hours. No results for input(s): "AMMONIA" in the last 168 hours. Coagulation Profile: Recent Labs  Lab 06/09/22 0701  INR 1.0   Cardiac Enzymes: No results for input(s): "CKTOTAL", "CKMB", "CKMBINDEX", "TROPONINI" in the last 168 hours. BNP (last 3 results) No results for input(s): "PROBNP" in the last 8760 hours. HbA1C: No results for input(s): "HGBA1C" in the last 72 hours. CBG: Recent Labs  Lab 06/13/22 1949 06/13/22 2315 06/14/22 0346 06/14/22 0755 06/14/22 1133  GLUCAP 118* 126* 107* 123* 126*   Lipid Profile: No results for input(s): "CHOL", "HDL", "LDLCALC", "TRIG", "CHOLHDL", "LDLDIRECT" in the last 72 hours. Thyroid Function Tests: No results for input(s): "TSH", "T4TOTAL", "FREET4", "T3FREE", "THYROIDAB" in the last 72 hours. Anemia Panel: No  results for input(s): "VITAMINB12", "FOLATE", "FERRITIN", "TIBC", "IRON", "RETICCTPCT" in the last 72 hours. Urine analysis:    Component Value Date/Time   COLORURINE YELLOW (A) 06/04/2022 0020   APPEARANCEUR CLEAR (A) 06/04/2022 0020   APPEARANCEUR Clear 08/31/2013 0312   LABSPEC 1.023 06/04/2022 0020   LABSPEC 1.003 08/31/2013 0312   PHURINE 8.0 06/04/2022 0020   GLUCOSEU NEGATIVE 06/04/2022 0020   GLUCOSEU Negative 08/31/2013 0312   HGBUR NEGATIVE 06/04/2022 0020   BILIRUBINUR NEGATIVE 06/04/2022 0020   BILIRUBINUR Negative 08/31/2013 0312   KETONESUR NEGATIVE 06/04/2022 0020   PROTEINUR NEGATIVE 06/04/2022 0020   NITRITE NEGATIVE 06/04/2022 0020   LEUKOCYTESUR SMALL (A) 06/04/2022 0020   LEUKOCYTESUR Negative 08/31/2013 0312   Sepsis Labs: '@LABRCNTIP'$ (procalcitonin:4,lacticidven:4)  No results found for this or any previous visit (from the past 240 hour(s)).        Radiology Studies: CT Soft Tissue Neck W Contrast  Result Date: 06/04/2022 CLINICAL DATA:  Hematologic malignancy EXAM: CT NECK WITH CONTRAST TECHNIQUE: Multidetector CT imaging of the neck was performed using the standard protocol following the bolus administration of intravenous contrast. RADIATION DOSE REDUCTION: This exam was performed according to the departmental dose-optimization program which includes automated exposure control, adjustment of the  mA and/or kV according to patient size and/or use of iterative reconstruction technique. CONTRAST:  5m OMNIPAQUE IOHEXOL 350 MG/ML SOLN COMPARISON:  05/15/2022 FINDINGS: PHARYNX AND LARYNX: Rightward deviation of the pharynx and upper larynx is unchanged. There is persistent left asymmetric soft tissue thickening at the larynx. The airway is maintained. Post treatment thickening of the epiglottis. Unchanged small retropharyngeal effusion. SALIVARY GLANDS: Post treatment hyperenhancement of the salivary glands is unchanged. THYROID: Normal. LYMPH NODES: Conglomerate  nodal mass at left level 2A with areas of central necrosis is unchanged allowing for differences in scan quality and contrast bolus timing. 5 mm right level 1A node is also unchanged. No new lymphadenopathy. VASCULAR: There is a right chest wall Port-A-Cath the terminates below the field of view. The left internal jugular vein remains occluded. There is narrowing of the distal left ICA due to mass effect by the surrounding soft tissues, unchanged. LIMITED INTRACRANIAL: Normal. VISUALIZED ORBITS: Normal. MASTOIDS AND VISUALIZED PARANASAL SINUSES: No fluid levels or advanced mucosal thickening. No mastoid effusion. SKELETON: No bony spinal canal stenosis. No lytic or blastic lesions. UPPER CHEST: Clear. OTHER: Unchanged cutaneous lesion of the left retroauricular region. IMPRESSION: 1. Unchanged appearance of confluent nodal mass at left level 2A with areas of central necrosis. 2. Unchanged 5 mm right level 1A node. 3. Unchanged cutaneous lesion of the left retroauricular region. 4. 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 Electronically Signed   By: KUlyses JarredM.D.   On: 06/04/2022 00:18   CT HEAD WO CONTRAST (5MM)  Result Date: 06/03/2022 CLINICAL DATA:  Altered mental status, head and neck carcinoma EXAM: CT HEAD WITHOUT CONTRAST TECHNIQUE: Contiguous axial images were obtained from the base of the skull through the vertex without intravenous contrast. RADIATION DOSE REDUCTION: This exam was performed according to the departmental dose-optimization program which includes automated exposure control, adjustment of the mA and/or kV according to patient size and/or use of iterative reconstruction technique. COMPARISON:  MRI 05/16/2022 FINDINGS: Brain: Normal anatomic configuration. Parenchymal volume loss is commensurate with the patient's age. Mild periventricular white matter changes are present likely reflecting the sequela of small vessel ischemia. No abnormal intra or extra-axial mass lesion  or fluid collection. No abnormal mass effect or midline shift. No evidence of acute intracranial hemorrhage or infarct. Ventricular size is normal. Cerebellum unremarkable. Vascular: No asymmetric hyperdense vasculature at the skull base. Skull: Intact Sinuses/Orbits: Paranasal sinuses are clear. Orbits are unremarkable. Other: Mastoid air cells and middle ear cavities are clear. Left retroauricular soft tissue mass is partially visualized at the inferior margin of the examination, better assessed on CT examination of the neck on 05/15/2022. IMPRESSION: 1. No acute intracranial abnormality. 2. Left retroauricular soft tissue mass partially visualized, better assessed on CT examination of the neck on 05/15/2022. Electronically Signed   By: AFidela SalisburyM.D.   On: 06/03/2022 22:12   DG Chest Port 1 View  Result Date: 06/03/2022 CLINICAL DATA:  Questionable sepsis EXAM: PORTABLE CHEST 1 VIEW COMPARISON:  05/15/2022 FINDINGS: The heart size and mediastinal contours are within normal limits. Right chest port catheter. Both lungs are clear. The visualized skeletal structures are unremarkable. IMPRESSION: No acute abnormality of the lungs in AP portable projection. Electronically Signed   By: ADelanna AhmadiM.D.   On: 06/03/2022 21:20            LOS: 10 days       NEmeterio Reeve DO [facility] 06/14/2022, 3:28 PM   Staff may message me via  secure chat in [place]  but this may not receive immediate response,  please page for urgent matters!  If 7PM-7AM, please contact night-coverage [email or web address]  Dictation software was used to generate the above note. Typos may occur and escape review, as with typed/written notes. Please contact Dr Sheppard Coil directly for clarity if needed.

## 2022-06-14 NOTE — Progress Notes (Signed)
Hematology/Oncology Progress note [phone] [phone]     Patient Care Team: Neomia Dear, MD as PCP - General (Family Medicine)   Name of the patient: Christina Porter  [id number]  [birth year]  Date of visit: 06/14/22   INTERVAL HISTORY-  06/09/2022 s/p tracheostomy, coil embolization of first 3 external carotid artery branches and placed Left Carotid Stent On aspirin and Plavix.   Patient is awake, left facial swelling due to surgery.+ Some pain  Allergies  Allergen Reactions   Benadryl [Diphenhydramine] Other (See Comments)    Somnolence when given 25 mg IV; consider low dose administration    Patient Active Problem List   Diagnosis Date Noted   Aspiration pneumonia ([place]) 06/10/2022   Acute respiratory failure with hypoxia (HCC) 06/09/2022   Pressure injury of skin 06/07/2022   Head and neck cancer ([employer])    Hyponatremia 06/04/2022   SIRS (systemic inflammatory response syndrome) (HCC) 06/04/2022   Abnormal urinalysis 06/04/2022   Cancer associated pain 06/04/2022   Underweight 06/04/2022   Possible Seizure disorder ([place]) 05/16/2022   Bipolar 1 disorder ([place]) 05/16/2022   Tobacco abuse 05/16/2022   Throat cancer ([place]) 05/16/2022   Hypokalemia 05/16/2022   Protein-calorie malnutrition, severe 05/16/2022   Internal jugular vein thrombosis, 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     Past Medical History:  Diagnosis Date   Bipolar 1 disorder (HCC)    Seizures ([place])      Past Surgical History:  Procedure Laterality Date   CAROTID ANGIOGRAPHY Left 06/09/2022   Procedure: CAROTID ANGIOGRAPHY;  Surgeon: Algernon Huxley, MD;  Location: [facility];  Service: Cardiovascular;  Laterality: Left;   CESAREAN SECTION     TRACHEOSTOMY TUBE PLACEMENT N/A 06/09/2022   Procedure: AWAKE TRACHEOSTOMY;  Surgeon: Carloyn Manner, MD;  Location: [facility];  Service: ENT;  Laterality: N/A;    Social History   Socioeconomic History   Marital status: Married     Spouse name: Not on file   Number of children: Not on file   Years of education: Not on file   Highest education level: Not on file  Occupational History   Not on file  Tobacco Use   Smoking status: Every Day    Types: Cigarettes   Smokeless tobacco: Never  Vaping Use   Vaping Use: Never used  Substance and Sexual Activity   Alcohol use: Yes   Drug use: Never   Sexual activity: Not on file  Other Topics Concern   Not on file  Social History Narrative   ** Merged History Encounter **       Social Determinants of Health   Financial Resource Strain: Not on file  Food Insecurity: Not on file  Transportation Needs: Not on file  Physical Activity: Not on file  Stress: Not on file  Social Connections: Not on file  Intimate Partner Violence: Not on file     Family History  Family history unknown: Yes     Current Facility-Administered Medications:    0.9 %  sodium chloride infusion, 250 mL, Intravenous, Continuous, Dew, Erskine Squibb, MD, Last Rate: 10 mL/hr at 06/09/22 1359, 250 mL at 06/09/22 1359   0.9 %  sodium chloride infusion, , Intravenous, Continuous, Emeterio Reeve, DO, Last Rate: 75 mL/hr at 06/14/22 0813, New Bag at 06/14/22 0813   acetaminophen (TYLENOL) 160 MG/5ML solution 650 mg, 650 mg, Per Tube, Q6H PRN, Algernon Huxley, MD, 650 mg at 06/08/22 2030   acetaminophen (TYLENOL) tablet 650 mg, 650  mg, Oral, Q6H PRN, 650 mg at 06/13/22 0511 **OR** acetaminophen (TYLENOL) suppository 650 mg, 650 mg, Rectal, Q6H PRN, Lucky Cowboy, Erskine Squibb, MD   Ampicillin-Sulbactam (UNASYN) 3 g in sodium chloride 0.9 % 100 mL IVPB, 3 g, Intravenous, Q6H, Dew, Erskine Squibb, MD, Last Rate: 200 mL/hr at 06/14/22 1702, 3 g at 06/14/22 1702   aspirin chewable tablet 81 mg, 81 mg, Per Tube, Daily, Noralee Space, RPH, 81 mg at 06/14/22 0813   Chlorhexidine Gluconate Cloth 2 % PADS 6 each, 6 each, Topical, Daily, Dew, Erskine Squibb, MD, 6 each at 06/13/22 1028   clopidogrel (PLAVIX) tablet 75 mg, 75 mg, Per  Tube, Daily, Dew, Erskine Squibb, MD, 75 mg at 06/14/22 0813   feeding supplement (OSMOLITE 1.2 CAL) liquid 1,000 mL, 1,000 mL, Per Tube, Continuous, Candee Furbish, MD, Last Rate: 55 mL/hr at 06/13/22 2300, Infusion Verify at 06/13/22 2300   free water 30 mL, 30 mL, Per Tube, Q4H, Candee Furbish, MD, 30 mL at 06/14/22 1753   gabapentin (NEURONTIN) 250 MG/5ML solution 600 mg, 600 mg, Per Tube, TID, Algernon Huxley, MD, 600 mg at 06/14/22 2057   lidocaine (XYLOCAINE) 2 % viscous mouth solution 15 mL, 15 mL, Mouth/Throat, Q3H PRN, Emeterio Reeve, DO   morphine 10 MG/5ML solution 10 mg, 10 mg, Per Tube, Q6H, Emeterio Reeve, DO, 10 mg at 06/14/22 1753   morphine 10 MG/5ML solution 10 mg, 10 mg, Per Tube, Q2H PRN, Emeterio Reeve, DO   naloxone [facility]) injection 0.4 mg, 0.4 mg, Intravenous, PRN, Candee Furbish, MD   nutrition supplement (JUVEN) (JUVEN) powder packet 1 packet, 1 packet, Per Tube, BID BM, Candee Furbish, MD, 1 packet at 06/14/22 1408   ondansetron (ZOFRAN) tablet 4 mg, 4 mg, Oral, Q6H PRN **OR** ondansetron (ZOFRAN) injection 4 mg, 4 mg, Intravenous, Q6H PRN, Lucky Cowboy, Erskine Squibb, MD, 4 mg at 06/09/22 0230   Oral care mouth rinse, 15 mL, Mouth Rinse, PRN, Algernon Huxley, MD   Oral care mouth rinse, 15 mL, Mouth Rinse, 4 times per day, Armando Reichert, MD, 15 mL at 06/13/22 2222   pantoprazole (PROTONIX) 2 mg/mL oral suspension 40 mg, 40 mg, Per Tube, Daily, Dew, Erskine Squibb, MD, 40 mg at 06/14/22 0813   polyethylene glycol (MIRALAX / GLYCOLAX) packet 17 g, 17 g, Per Tube, Daily, Algernon Huxley, MD, 17 g at 06/10/22 4665   QUEtiapine (SEROQUEL) tablet 100 mg, 100 mg, Per Tube, QHS, Algernon Huxley, MD, 100 mg at 06/14/22 2056   sodium chloride (OCEAN) 0.65 % nasal spray 1 spray, 1 spray, Each Nare, PRN, Algernon Huxley, MD, 1 spray at 06/07/22 1233   sodium chloride flush (NS) 0.9 % injection 10-40 mL, 10-40 mL, Intracatheter, Q12H, Dew, Jason S, MD, 10 mL at 06/14/22 2056   sodium chloride flush (NS) 0.9 %  injection 10-40 mL, 10-40 mL, Intracatheter, PRN, Lucky Cowboy, Erskine Squibb, MD   tranexamic acid (CYKLOKAPRON) 1000 MG/10ML nebulizer solution 500 mg, 500 mg, Nebulization, Q8H, Candee Furbish, MD, 500 mg at 06/14/22 2104   traZODone (DESYREL) tablet 100 mg, 100 mg, Per Tube, QHS, Algernon Huxley, MD, 100 mg at 06/14/22 2057   Physical exam:  Vitals:   06/14/22 1637 06/14/22 1955 06/14/22 2000 06/14/22 2054  BP: 96/62 (!) 92/59    Pulse: (!) 102 (!) 101    Resp: 16 16    Temp: 99.8 F (37.7 C) 99.7 F (37.6 C)    TempSrc:  Oral  SpO2: 100% 100% 100% 100%  Weight:      Height:       Physical Exam  Constitutional:      Appearance: She is ill-appearing, cachectic Neck:     Comments: Left neck covered with dressing, edema.  Cardiovascular:     Rate and Rhythm: Tachycardia present.  Pulmonary:     Effort: Pulmonary effort is normal. No respiratory distress.  Abdominal:     General: Abdomen is flat.  Musculoskeletal:        General: No swelling.  Lymphadenopathy:     Cervical: Cervical adenopathy present.  Skin:    General: Skin is warm.  Neurological:     Mental Status: She is alert.  Left facial droop      Latest Ref Rng & Units 06/14/2022    5:44 AM  CMP  Glucose 70 - 99 mg/dL 107   BUN 8 - 23 mg/dL 17   Creatinine 0.44 - 1.00 mg/dL 0.46   Sodium 135 - 145 mmol/L 133   Potassium 3.5 - 5.1 mmol/L 3.9   Chloride 98 - 111 mmol/L 102   CO2 22 - 32 mmol/L 26   Calcium 8.9 - 10.3 mg/dL 8.0       Latest Ref Rng & Units 06/13/2022    4:28 AM  CBC  WBC 4.0 - 10.5 K/uL 16.5   Hemoglobin 12.0 - 15.0 g/dL 9.1   Hematocrit 36.0 - 46.0 % 27.5   Platelets 150 - 400 K/uL 481     RADIOGRAPHIC STUDIES: I have personally reviewed the radiological images as listed and agreed with the findings in the report. DG Chest Port 1 View  Result Date: 06/12/2022 CLINICAL DATA:  Pleural effusion EXAM: PORTABLE CHEST 1 VIEW COMPARISON:  Previous studies including the examination of 06/10/2022  FINDINGS: Tip of tracheostomy tube is 5.6 cm above the carina. Tip of right IJ chest port is seen in right atrium. Cardiac size is within normal limits. Central pulmonary vessels are prominent. Haziness in right lower lung fields suggest layering of small to moderate right pleural effusion. Increased density in left lower lung field may suggest atelectasis/pneumonia. There is slight improvement in aeration in left lower lung field. Left lateral CP angle is clear. There is no pneumothorax. IMPRESSION: Small-to-moderate right pleural effusion. Increased density in both lower lung fields suggest possible atelectasis/pneumonia. Electronically Signed   By: Elmer Picker M.D.   On: 06/12/2022 08:55   DG Chest Port 1 View  Result Date: 06/10/2022 CLINICAL DATA:  [id number].  Hypoxic respiratory failure. EXAM: PORTABLE CHEST 1 VIEW COMPARISON:  Portable chest yesterday at 9:31 a.m. FINDINGS: 4:42 a.m. Tracheostomy cannula tip is 4.6 cm from the carina. Right IJ port catheter again terminates in the upper right atrium. Small or moderate right pleural effusion has increased from today's exam with increased overlying hazy consolidation or atelectasis in the right lower lung field. There is unchanged hazy opacity in the left base. The remaining lungs are clear. The cardiac size and mediastinal silhouette are normal. In all other respects no further changes. IMPRESSION: 1. Worsening small or moderate right pleural effusion and overlying atelectasis or consolidation. 2. Stable hazy interstitial consolidation left base. Electronically Signed   By: Telford Nab M.D.   On: 06/10/2022 07:10   PERIPHERAL VASCULAR CATHETERIZATION  Result Date: 06/09/2022 See surgical note for result.  Portable Chest x-ray  Result Date: 06/09/2022 CLINICAL DATA:  Intubation EXAM: PORTABLE CHEST 1 VIEW COMPARISON:  06/09/2022 FINDINGS: Porta catheter on the right  with tip at the upper cavoatrial junction. Tracheostomy tube in place. Hazy  density at the bases with small right pleural effusion. Aeration is improved from prior. Stable heart size and mediastinal contours. IMPRESSION: Tracheostomy tube without complicating feature. Improved aeration at the right base. Electronically Signed   By: Jorje Guild M.D.   On: 06/09/2022 09:43   DG Chest Port 1 View  Result Date: 06/06/2022 CLINICAL DATA:  61 year old female with hypoxia.  History of cancer. EXAM: PORTABLE CHEST 1 VIEW COMPARISON:  Portable chest 06/03/2022 and earlier. FINDINGS: Portable AP upright view at 0424 hours. Stable right chest power port. Mildly lower lung volumes. Streaky and veiling new right lung base opacity. No pneumothorax. No pulmonary edema suspected. Left lung appears stable and negative. Percutaneous gastrostomy tube visible with negative upper abdominal bowel gas pattern. No acute osseous abnormality identified. IMPRESSION: New right lung base opacity which appears to be a combination of airspace disease and small new pleural effusion. Consider aspiration and/or pneumonia. Electronically Signed   By: Genevie Ann M.D.   On: 06/06/2022 04:36   CT Soft Tissue Neck W Contrast  Result Date: 06/04/2022 CLINICAL DATA:  Hematologic malignancy EXAM: CT NECK WITH CONTRAST TECHNIQUE: Multidetector CT imaging of the neck was performed using the standard protocol following the bolus administration of intravenous contrast. RADIATION DOSE REDUCTION: This exam was performed according to the departmental dose-optimization program which includes automated exposure control, adjustment of the mA and/or kV according to patient size and/or use of iterative reconstruction technique. CONTRAST:  58m OMNIPAQUE IOHEXOL 350 MG/ML SOLN COMPARISON:  05/15/2022 FINDINGS: PHARYNX AND LARYNX: Rightward deviation of the pharynx and upper larynx is unchanged. There is persistent left asymmetric soft tissue thickening at the larynx. The airway is maintained. Post treatment thickening of the epiglottis.  Unchanged small retropharyngeal effusion. SALIVARY GLANDS: Post treatment hyperenhancement of the salivary glands is unchanged. THYROID: Normal. LYMPH NODES: Conglomerate nodal mass at left level 2A with areas of central necrosis is unchanged allowing for differences in scan quality and contrast bolus timing. 5 mm right level 1A node is also unchanged. No new lymphadenopathy. VASCULAR: There is a right chest wall Port-A-Cath the terminates below the field of view. The left internal jugular vein remains occluded. There is narrowing of the distal left ICA due to mass effect by the surrounding soft tissues, unchanged. LIMITED INTRACRANIAL: Normal. VISUALIZED ORBITS: Normal. MASTOIDS AND VISUALIZED PARANASAL SINUSES: No fluid levels or advanced mucosal thickening. No mastoid effusion. SKELETON: No bony spinal canal stenosis. No lytic or blastic lesions. UPPER CHEST: Clear. OTHER: Unchanged cutaneous lesion of the left retroauricular region. IMPRESSION: 1. Unchanged appearance of confluent nodal mass at left level 2A with areas of central necrosis. 2. Unchanged 5 mm right level 1A node. 3. Unchanged cutaneous lesion of the left retroauricular region. 4. 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 Electronically Signed   By: KUlyses JarredM.D.   On: 06/04/2022 00:18   CT HEAD WO CONTRAST (5MM)  Result Date: 06/03/2022 CLINICAL DATA:  Altered mental status, head and neck carcinoma EXAM: CT HEAD WITHOUT CONTRAST TECHNIQUE: Contiguous axial images were obtained from the base of the skull through the vertex without intravenous contrast. RADIATION DOSE REDUCTION: This exam was performed according to the departmental dose-optimization program which includes automated exposure control, adjustment of the mA and/or kV according to patient size and/or use of iterative reconstruction technique. COMPARISON:  MRI 05/16/2022 FINDINGS: Brain: Normal anatomic configuration. Parenchymal volume loss is commensurate with the  patient's age.  Mild periventricular white matter changes are present likely reflecting the sequela of small vessel ischemia. No abnormal intra or extra-axial mass lesion or fluid collection. No abnormal mass effect or midline shift. No evidence of acute intracranial hemorrhage or infarct. Ventricular size is normal. Cerebellum unremarkable. Vascular: No asymmetric hyperdense vasculature at the skull base. Skull: Intact Sinuses/Orbits: Paranasal sinuses are clear. Orbits are unremarkable. Other: Mastoid air cells and middle ear cavities are clear. Left retroauricular soft tissue mass is partially visualized at the inferior margin of the examination, better assessed on CT examination of the neck on 05/15/2022. IMPRESSION: 1. No acute intracranial abnormality. 2. Left retroauricular soft tissue mass partially visualized, better assessed on CT examination of the neck on 05/15/2022. Electronically Signed   By: Fidela Salisbury M.D.   On: 06/03/2022 22:12   DG Chest Port 1 View  Result Date: 06/03/2022 CLINICAL DATA:  Questionable sepsis EXAM: PORTABLE CHEST 1 VIEW COMPARISON:  05/15/2022 FINDINGS: The heart size and mediastinal contours are within normal limits. Right chest port catheter. Both lungs are clear. The visualized skeletal structures are unremarkable. IMPRESSION: No acute abnormality of the lungs in AP portable projection. Electronically Signed   By: Delanna Ahmadi M.D.   On: 06/03/2022 21:20   MR Brain W and Wo Contrast  Result Date: 05/16/2022 CLINICAL DATA:  Seizures and balance loss. History of head neck carcinoma. EXAM: MRI HEAD WITHOUT AND WITH CONTRAST TECHNIQUE: Multiplanar, multiecho pulse sequences of the brain and surrounding structures were obtained without and with intravenous contrast. CONTRAST:  71m GADAVIST GADOBUTROL 1 MMOL/ML IV SOLN COMPARISON:  None Available. FINDINGS: Brain: No acute infarct, mass effect or extra-axial collection. No acute or chronic hemorrhage. Normal white matter  signal, parenchymal volume and CSF spaces. The midline structures are normal. Vascular: Major flow voids are preserved. Skull and upper cervical spine: Findings within the soft tissues of the left neck are more completely described on the concomitant CT of the neck. Briefly, there is confluent lymphadenopathy with central necrosis and a postauricular skin lesion. Normal calvarium. Sinuses/Orbits:No paranasal sinus fluid levels or advanced mucosal thickening. No mastoid or middle ear effusion. Normal orbits. IMPRESSION: 1. Normal brain.  No intracranial metastatic disease. 2. Findings of disease spread within the soft tissues of the left neck are more completely described on the concomitant CT of the neck. Electronically Signed   By: KUlyses JarredM.D.   On: 05/16/2022 01:04   CT Soft Tissue Neck W Contrast  Result Date: 05/15/2022 CLINICAL DATA:  Soft tissue swelling. History of head neck carcinoma. EXAM: CT NECK WITH CONTRAST TECHNIQUE: Multidetector CT imaging of the neck was performed using the standard protocol following the bolus administration of intravenous contrast. RADIATION DOSE REDUCTION: This exam was performed according to the departmental dose-optimization program which includes automated exposure control, adjustment of the mA and/or kV according to patient size and/or use of iterative reconstruction technique. CONTRAST:  729mOMNIPAQUE IOHEXOL 300 MG/ML  SOLN COMPARISON:  None Available. FINDINGS: PHARYNX AND LARYNX: Diffuse edema of the soft tissues of the oropharynx, hypopharynx and larynx, including the epiglottis. There is a small retropharyngeal effusion but no retropharyngeal or peritonsillar abscess. SALIVARY GLANDS: Increased salivary gland enhancement is likely a post treatment effect. THYROID: Normal. LYMPH NODES: Left level 2A nodal conglomerate with area of central necrosis that measures approximately 1.4 cm. There is an enhancing 5 mm right level 1A node. VASCULAR: The left internal  jugular vein is thrombosed. There is abnormal contrast enhancement surrounding its course in the  left neck, just distal to the carotid bifurcation. The other major vessels are patent. There is a right chest wall Port-A-Cath with its tip below the field of view. LIMITED INTRACRANIAL: Normal. VISUALIZED ORBITS: Normal. MASTOIDS AND VISUALIZED PARANASAL SINUSES: No fluid levels or advanced mucosal thickening. No mastoid effusion. SKELETON: No bony spinal canal stenosis. No lytic or blastic lesions. UPPER CHEST: Clear. OTHER: There is a left retro auricular skin lesion measuring 2.5 x 1.3 cm. IMPRESSION: 1. Area of abnormal contrast enhancement at left level 2A, likely nodal conglomerate with areas of necrosis. There is likely extranodal extension with a cutaneous lesion of the retroauricular soft tissue. 2. Diffuse edema of the soft tissues of the oropharynx, hypopharynx and larynx, likely sequela of radiation therapy. 3. 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 Electronically Signed   By: Ulyses Jarred M.D.   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 Previously noted to [facility] clinical trial with NBTX clinical trial with radiation. Has not started on radiation. Extensive local regional tumor burden with increased risk of severe artery bleeding,septic thrombophlebitis and cavernous sinus invasion.   Status post tracheostomy/coil embolization and left carotid stenting. Current on aspirin and Plavix.  Continue IV antibiotics for aspiration pneumonia. Last hemoglobin 9.1.  Keep active type and screen every 72 hours.  Transfuse PRBC to keep above 7 if active bleeding. Prognosis is extremely poor.  Transfer to [facility] did not happen due to lack of bed availability. Family remains hopeful to get additional treatments at [facility].  Patient is still full code. No additional recommendation from oncology  aspect.  Family plans to take patient to [facility] after being discharged here.  Hospitalist team Dr. Sheppard Coil was updated via secure chat.  Dr. Cecille Aver and Ander Purpura will be covering this patient's case in my absence from 06/15/2022. Thank you for allowing me to participate in the care of this patient.   Earlie Server, MD, PhD Hematology Oncology 06/14/2022

## 2022-06-15 DIAGNOSIS — J690 Pneumonitis due to inhalation of food and vomit: Secondary | ICD-10-CM | POA: Diagnosis not present

## 2022-06-15 DIAGNOSIS — C140 Malignant neoplasm of pharynx, unspecified: Secondary | ICD-10-CM | POA: Diagnosis not present

## 2022-06-15 DIAGNOSIS — C760 Malignant neoplasm of head, face and neck: Secondary | ICD-10-CM | POA: Diagnosis not present

## 2022-06-15 DIAGNOSIS — I82C12 Acute embolism and thrombosis of left internal jugular vein: Secondary | ICD-10-CM | POA: Diagnosis not present

## 2022-06-15 DIAGNOSIS — J9601 Acute respiratory failure with hypoxia: Secondary | ICD-10-CM | POA: Diagnosis not present

## 2022-06-15 DIAGNOSIS — G40909 Epilepsy, unspecified, not intractable, without status epilepticus: Secondary | ICD-10-CM | POA: Diagnosis not present

## 2022-06-15 LAB — CBC
HCT: 26.3 % — ABNORMAL LOW (ref 36.0–46.0)
Hemoglobin: 8.7 g/dL — ABNORMAL LOW (ref 12.0–15.0)
MCH: 30.5 pg (ref 26.0–34.0)
MCHC: 33.1 g/dL (ref 30.0–36.0)
MCV: 92.3 fL (ref 80.0–100.0)
Platelets: 657 10*3/uL — ABNORMAL HIGH (ref 150–400)
RBC: 2.85 MIL/uL — ABNORMAL LOW (ref 3.87–5.11)
RDW: 13.5 % (ref 11.5–15.5)
WBC: 14.8 10*3/uL — ABNORMAL HIGH (ref 4.0–10.5)
nRBC: 0 % (ref 0.0–0.2)

## 2022-06-15 LAB — BASIC METABOLIC PANEL
Anion gap: 8 (ref 5–15)
BUN: 14 mg/dL (ref 8–23)
CO2: 25 mmol/L (ref 22–32)
Calcium: 8.3 mg/dL — ABNORMAL LOW (ref 8.9–10.3)
Chloride: 101 mmol/L (ref 98–111)
Creatinine, Ser: 0.44 mg/dL (ref 0.44–1.00)
GFR, Estimated: >60 mL/min (ref >60)
Glucose, Bld: 154 mg/dL — ABNORMAL HIGH (ref 70–99)
Potassium: 3.7 mmol/L (ref 3.5–5.1)
Sodium: 134 mmol/L — ABNORMAL LOW (ref 135–145)

## 2022-06-15 LAB — MAGNESIUM: Magnesium: 2 mg/dL (ref 1.7–2.4)

## 2022-06-15 LAB — GLUCOSE, CAPILLARY
Glucose-Capillary: 130 mg/dL — ABNORMAL HIGH (ref 70–99)
Glucose-Capillary: 130 mg/dL — ABNORMAL HIGH (ref 70–99)
Glucose-Capillary: 132 mg/dL — ABNORMAL HIGH (ref 70–99)
Glucose-Capillary: 134 mg/dL — ABNORMAL HIGH (ref 70–99)
Glucose-Capillary: 140 mg/dL — ABNORMAL HIGH (ref 70–99)
Glucose-Capillary: 145 mg/dL — ABNORMAL HIGH (ref 70–99)

## 2022-06-15 LAB — PHOSPHORUS: Phosphorus: 3.4 mg/dL (ref 2.5–4.6)

## 2022-06-15 NOTE — Progress Notes (Signed)
Physical Therapy Treatment Patient Details Name: [name] R. Karilyn Wind MRN: [id number] DOB: Feb 18, 1961 Today's Date: 06/15/2022   History of Present Illness Pt is a 61 y/o F admitted on 06/03/22 after presenting to the ED with c/o SOB & confusion. 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 Pt developed acute respiratory distress 2/2 hemoptysis requiring emergent tracheostomy for airway protection, carotid artery emobilization & carotid stent placement. 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 She ambulated a lap around the nursing station with rolling walker with Min guard initially progressing to supervision with increased ambulation distance. No physical assistance is required for bed mobility or transfers this session. Recommend to continue PT to maximize independence in preparation for discharge home with family support.    Recommendations for follow up therapy are one component of a multi-disciplinary discharge planning process, led by the attending physician.  Recommendations may be updated based on patient status, additional functional criteria and insurance authorization.  Follow Up Recommendations  Home health PT     Assistance Recommended at Discharge Intermittent Supervision/Assistance  Patient can return home with the following A little help with walking and/or transfers;A little help with bathing/dressing/bathroom;Assist for transportation;Help with stairs or ramp for entrance;Assistance with cooking/housework   Equipment Recommendations  Rolling walker (2 wheels);Hospital  bed;BSC/3in1    Recommendations for Other Services       Precautions / Restrictions Precautions Precautions: Fall Precaution Comments: trach, PEG Restrictions Weight Bearing Restrictions: No     Mobility  Bed Mobility Overal bed mobility: Needs Assistance Bed Mobility: Supine to Sit     Supine to sit: Supervision Sit to supine: Supervision   General bed mobility comments: no physical assistance required. cues for technique    Transfers Overall transfer level: Needs assistance Equipment used: Rolling walker (2 wheels) Transfers: Sit to/from Stand Sit to Stand: Supervision           General transfer comment: cues for hand placement for safety    Ambulation/Gait Ambulation/Gait assistance: Min guard, Supervision Gait Distance (Feet): 175 Feet Assistive device: Rolling walker (2 wheels) Gait Pattern/deviations: Step-through pattern, Narrow base of support Gait velocity: decreased     General Gait Details: occasional narrow base of support, especially with turns. occasional cues for safety and placement of rolling walker. no shortness of breath is noted with ambulation, however patient does have intermittent coughing with mobility   Stairs             Wheelchair Mobility    Modified Rankin (Stroke Patients Only)       Balance Overall balance assessment: Needs assistance Sitting-balance support: Feet supported Sitting balance-Leahy Scale: Fair     Standing balance support: Bilateral upper extremity supported Standing balance-Leahy Scale: Fair Standing balance comment: with RW for UE support, no further external support is required                            Cognition Arousal/Alertness: Awake/alert Behavior During Therapy: WFL for tasks assessed/performed Overall Cognitive Status: Difficult to assess  General Comments: patient able to follow all commands consistently        Exercises       General Comments General comments (skin integrity, edema, etc.): mobility performed with trach mask at 28% Fi02 and 4 L02. Sp02 in the high 90's even with briefly removing the oxygen to use bed side commode. patient able to compelte peri-care after using bed side commode with set-up      Pertinent Vitals/Pain Pain Assessment Pain Assessment: No/denies pain    Home Living                          Prior Function            PT Goals (current goals can now be found in the care plan section) Acute Rehab PT Goals Patient Stated Goal: none stated PT Goal Formulation: With patient Time For Goal Achievement: 06/26/22 Potential to Achieve Goals: Good Progress towards PT goals: Progressing toward goals    Frequency    Min 2X/week      PT Plan Frequency needs to be updated (adjusted due to progress made with PT intervention)    Co-evaluation              AM-PAC PT "6 Clicks" Mobility   Outcome Measure  Help needed turning from your back to your side while in a flat bed without using bedrails?: None Help needed moving from lying on your back to sitting on the side of a flat bed without using bedrails?: A Little Help needed moving to and from a bed to a chair (including a wheelchair)?: A Little Help needed standing up from a chair using your arms (e.g., wheelchair or bedside chair)?: A Little Help needed to walk in hospital room?: A Little Help needed climbing 3-5 steps with a railing? : A Little 6 Click Score: 19    End of Session Equipment Utilized During Treatment: Oxygen Activity Tolerance: Patient tolerated treatment well Patient left: in bed;with call bell/phone within reach;with nursing/sitter in room Nurse Communication: Mobility status PT Visit Diagnosis: Unsteadiness on feet (R26.81);Muscle weakness (generalized) (M62.81);Other abnormalities of gait and mobility (R26.89)     Time: 1000-1023 PT Time Calculation (min) (ACUTE ONLY): 23  min  Charges:  $Gait Training: 8-22 mins $Therapeutic Activity: 8-22 mins                     Minna Merritts, PT, MPT    Percell Locus 06/15/2022, 2:16 PM

## 2022-06-15 NOTE — Progress Notes (Signed)
Occupational Therapy Treatment Patient Details Name: Christina Porter MRN: [id number] DOB: Jan 29, 1961 Today's Date: 06/15/2022   History of present illness Pt is a 60 y/o F admitted on 06/03/22 after presenting to the ED with c/o SOB & confusion. 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 Pt developed acute respiratory distress 2/2 hemoptysis requiring emergent tracheostomy for airway protection, carotid artery emobilization & carotid stent placement. 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 She was able to transfer from bed to [facility] with close SUPV for safety; perform pericare INDly; stand by bed with hand-held assist, fair balance. Pt is able to communicate her needs clearly with written notes and appears to enjoy/appreciate opportunity to engage in some life review with therapist. Provided educ re: pain mgmt, relaxation techniques. Pt's son and daughter present in room. Along with respiratory therapist, provided educ re: suctioning, tracheostomy care, with written instructions provided. They verbalized understanding; daughter reports she will return tomorrow for additional training. RN notified re: pt's high level of pain. Have updated DC recs to home with [place].    Recommendations for follow up therapy are one component of a multi-disciplinary discharge planning process, led by the attending physician.  Recommendations may be updated based on patient status, additional functional criteria and insurance authorization.    Follow Up Recommendations  Home health OT    Assistance Recommended at Discharge Frequent or constant  Supervision/Assistance  Patient can return home with the following  A little help with walking and/or transfers;A lot of help with bathing/dressing/bathroom;Assistance with cooking/housework;Help with stairs or ramp for entrance;Assist for transportation;Other (comment) (assist for suctioning, trach care)   Equipment Recommendations  BSC/3in1;Hospital bed    Recommendations for Other Services      Precautions / Restrictions Precautions Precautions: Fall Precaution Comments: trach, PEG Restrictions Weight Bearing Restrictions: No       Mobility Bed Mobility Overal bed mobility: Needs Assistance Bed Mobility: Supine to Sit, Sit to Supine     Supine to sit: Supervision Sit to supine: Supervision        Transfers Overall transfer level: Needs assistance Equipment used: None Transfers: Sit to/from Stand, Bed to chair/wheelchair/BSC Sit to Stand: Supervision Stand pivot transfers: Supervision               Balance Overall balance assessment: Needs assistance Sitting-balance support: Feet supported Sitting balance-Leahy Scale: Good     Standing balance support: No upper extremity supported Standing balance-Leahy Scale: Fair                             ADL either performed or assessed with clinical judgement   ADL Overall ADL's : Needs assistance/impaired   Eating/Feeding Details (indicate cue type and reason): Pt is NPO Grooming: Wash/dry hands;Wash/dry face;Sitting;Modified independent                   Armed forces technical officer: Supervision/safety;Stand-pivot;BSC/3in1   Writer and Hygiene: Modified independent;Sit to/from stand              Extremity/Trunk Assessment Upper Extremity Assessment Upper Extremity Assessment: Generalized weakness  Lower Extremity Assessment Lower Extremity Assessment: Generalized weakness        Vision       Perception     Praxis      Cognition Arousal/Alertness:  Awake/alert Behavior During Therapy: WFL for tasks assessed/performed Overall Cognitive Status: Within Functional Limits for tasks assessed                                 General Comments: Pt able to respond to directions consistently, communicate her needs via writing        Exercises Other Exercises Other Exercises: Educ to family re: trach, suction care; educ to pt re: DC recs, relaxation techniques, palliative care    Shoulder Instructions       General Comments mobility performed with trach mask at 28% Fi02 and 4 L02. Sp02 in the high 90's even with briefly removing the oxygen to use bed side commode. patient able to compelte peri-care after using bed side commode with set-up    Pertinent Vitals/ Pain       Pain Assessment Pain Assessment: 0-10 Pain Score: 10-Worst pain ever Pain Descriptors / Indicators: Grimacing Pain Intervention(s): Repositioned, Patient requesting pain meds-RN notified  Home Living Family/patient expects to be discharged to:: Private residence Living Arrangements: Alone Available Help at Discharge: Family Type of Home: Apartment Home Access: Level entry                                Prior Functioning/Environment              Frequency  Min 2X/week        Progress Toward Goals  OT Goals(current goals can now be found in the care plan section)  Progress towards OT goals: Progressing toward goals  Acute Rehab OT Goals OT Goal Formulation: With patient Time For Goal Achievement: 06/26/22 Potential to Achieve Goals: Good  Plan Discharge plan needs to be updated;Frequency remains appropriate    Co-evaluation                 AM-PAC OT "6 Clicks" Daily Activity     Outcome Measure   Help from another person eating meals?: None Help from another person taking care of personal grooming?: A Little Help from another person toileting, which includes using toliet, bedpan, or urinal?: A Lot Help from  another person bathing (including washing, rinsing, drying)?: A Lot Help from another person to put on and taking off regular upper body clothing?: A Little Help from another person to put on and taking off regular lower body clothing?: A Lot 6 Click Score: 16    End of Session Equipment Utilized During Treatment: Oxygen  OT Visit Diagnosis: Unsteadiness on feet (R26.81);Muscle weakness (generalized) (M62.81)   Activity Tolerance Patient limited by pain   Patient Left in bed;with call bell/phone within reach;with bed alarm set   Nurse Communication Mobility status;Patient requests pain meds        Time: 1791-5056 OT Time Calculation (min): 27 min  Charges: OT General Charges $OT Visit: 1 Visit OT Treatments $Self Care/Home Management : 23-37 mins Josiah Lobo, PhD, MS, OTR/L 06/15/22, 3:17 PM

## 2022-06-15 NOTE — Assessment & Plan Note (Signed)
Now resolved.  

## 2022-06-15 NOTE — Progress Notes (Signed)
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 Augmentin with plan for follow-up at [facility], with radiation starting on 05/30/2022.  She was seen in [facility] ED on 06/02/2022 for hypoxia and shortness of breath due to concern for aspiration pneumonia, unfortunately she left AMA.  She later presented to [facility] ED on the same day, but also left AMA. Presented again to [facility] ED 06/03/2022 w/ tachycardia/tachypnea.   Patient was admitted for sepsis secondary to possible UTI vs neck mass, started on IV fluids and IV antibiotics. 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   . 8/6: Admitted by Hospitalist . 8/7: ENT & ID consulted. . 8/8: Oncology consulted. Concern for aspiration . 8/9: Voice becoming hoarse. Palliative Care consulted . 8/10: Some SOB noted. Vascular Surgery consulted. . 8/11: Rapid response called due to recurrent bleeding with development of acute respiratory distress.  ENT performed emergent Tracheostomy, Vascular Surgery performed coil embolization of first 3 external carotid artery branches and placed Left Carotid Stent.  PCCM consulted for vent management . 8/12 attempted SBT, failed initial attempt secondary to tachypnea and hypoxia.  Later able to tolerate TC  . 8/13: Received 1 unit pRBC's overnight. Tolerating TC  trials . 8/15: transfer back to hospitalist service. D/w [name] see below. Spoke w/ son, see separate progress note, plan for meeting tomorrow. See progress note from 8/15 re d/w oncology team and ENT at [facility]  . 8/16: ambulating, urinating, tolerating trach w/ O2 support. Son has not shown up as of 3 pm. Pt appears to be progressing, will consult TOC for DME/HH if we can arrange this and no further bleeding may be appropriate to go home if patient ans her son are amenable  . 8/17: Family education for DME and trach care provided.  Waiting on all DME to be set up before discharge  [place]: Per palliative care note 08/14 and c/w multiple previous discussions w/ palliative care: oldest son is her preferred decision-maker. "She indicates  that she will undergo whatever is needed to try to add every single extra day she can get on this earth... Full code and full scope. Patient would like any and all care indicated for as long as possible regardless of pain or suffering, without limit."  Since patient follows up with hem-onc at [facility] (family is hopeful for treatment there to be helpful), her oncologist Dr Harriette Ohara at [facility] 06/06/2022 reported they would accept patient if/when bed available. Multiple attempts have been made to transfer the patient to [facility] but no bed available.     Assessment and Plan: * Acute respiratory failure with hypoxia ([place]) Now resolved  SIRS (systemic inflammatory response syndrome) (HCC) sepsis ruled out No definite source of infection noted but temperature 99.8 with pulse 116-124 respirations 18-29 and initially hypotensive at 95/61 responding to fluids.  CBC pending but was elevated on 8/4.  Procalcitonin 1.13  On IV Unasyn for aspiration pneumonia.  Switch to oral Augmentin at discharge per ID   Internal jugular vein thrombosis, left (HCC) Continue heparin flushes Was hospitalized from 7/17 to 7/21 with concern for septic thrombophlebitis.  Blood cultures were negative.  Was treated with  Unasyn and then Augmentin to complete a 2-week course ID following  Throat cancer ([place]) soft palate SCC; left neck FNA positive for malignancy consistent with metastatic SCC.  PET scan 12/15/2020 also notable for local metastatic disease - follows with Dr. Harriette Ohara at [facility] - s/p chemo (cisplatin) -Recently contacted by radiation oncology (Dr. Crisoforo Oxford) 05/12/2022.  They are trying to reschedule study injection and simulation appointments; she has missed prior appointments. Overall poor prognosis  Possible Seizure disorder (HCC) On gabapentin 600 mg 3 times daily   Bipolar 1 disorder ([place]) Resume home meds pending med rec  Aspiration pneumonia ([place]) Continue IV Unasyn and switch to Augmentin at discharge  Cancer associated pain Continue current pain medicine   Abnormal urinalysis Patient was given a dose of Rocephin in the ED based on abnormal urinalysis done at [facility] on 8/4 We will continue ceftriaxone given concerns for SIRS and possible sepsis Follow blood and urine cultures  Hyponatremia Improved with hydration  Protein-calorie malnutrition, severe Complicates overall prognosis.  BMI 16.85  Tobacco abuse Continue nicotine patch  Underweight-resolved as of 06/15/2022 BMI 16.94.  Suspect severe protein calorie malnutrition Nutritionist eval        Subjective: Patient expressing by writing in her notebook.  She wants to make sure all DME are in place and her family is educated how to take care of her at discharge  Physical Exam: Vitals:   06/15/22 0507 06/15/22 0800 06/15/22 1200 06/15/22 1548  BP:  '91/69 91/75 95/73 '$  Pulse:  (!) 105 (!) 101 100  Resp:  '18 16 18  '$ Temp:  98.5 F (36.9 C) 98.1 F (36.7 C) 98.2 F (36.8 C)  TempSrc:  Axillary Axillary   SpO2: 99% 99% 100% 100%  Weight:      Height:       General: Frail, deconditioned, cachectic, not in any distress Left neck covered by dressing Cardiovascular: RRR, S1/S2 +, no rubs, no gallops Respiratory: CTA  bilaterally, no wheezing, no rhonchi Abdominal: Soft, NT, ND, bowel sounds + Extremities: no edema, no cyanosis Data Reviewed:  Hemoglobin 8.7  Family Communication: Son updated over phone  Disposition: Status is: Inpatient Remains inpatient appropriate because: Needs to have all DME and trach supplies in place.  Family well-educated on how to use all those DME's in tracheostomy care before she can be discharged.  It may take a day or 2 before everything is in place per [facility]   Planned Discharge Destination: Home with Home Health    DVT prophylaxis/SCDs Time spent: 35 minutes  Author: Max Sane, MD 06/15/2022 5:30 PM  For on call review [email or web address].

## 2022-06-15 NOTE — Consult Note (Signed)
[place] Nurse Consult Note: Patient receiving care in [facility]. Reason for Consult: left neck fungating tumor Wound type: fungating tumor Pressure Injury POA: Yes/No/NA Measurement: 7 cm x 6 cm Wound bed: see photo from 06/05/22. This is the way the area appeared to me today at the time of my exam. Drainage (amount, consistency, odor) tan drainage on existing dry dressing Periwound: darkened Dressing procedure/placement/frequency: Gently cleanse tumor on left neck with saline, pat dry. Place a size appropriate piece of Aquacel Advantage Kellie Simmering [phone]) over the area and secure with a foam dressing or dry gauze and tape. Change daily and prn soilage. Thank you for the consult.  Discussed plan of care with the patient and bedside nurse.  [place] nurse will not follow at this time.  Please re-consult the [place] team if needed.  Val Riles, RN, MSN, CWOCN, CNS-BC, pager [phone]

## 2022-06-15 NOTE — Consult Note (Signed)
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 She underwent tracheostomy as well as coil embolization and stenting for hemorrhage from left carotid artery on 8/11. Pharmacy consulted to assist with electrolyte monitoring and replacement while patient is in the ICU.  Patient has a PEG tube MIVF: NS at 75 cc/hr I&O: + 8L  Goal of Therapy:  Electrolytes within normal limits  Plan:  No replacement needed at this time. Pharmacy will sign off as pt is transferred from ICU. Re-consult if needed.   Oswald Hillock, PharmD, BCPS 06/15/2022 8:10 AM

## 2022-06-15 NOTE — Assessment & Plan Note (Signed)
Complicates overall prognosis.  BMI 16.85

## 2022-06-15 NOTE — Progress Notes (Signed)
Pt's family members (son and daughter) instructed on trach care and suctioning with sterile technique. Son wanted to suction and demonstrated well with good technique. Son stated that his sister would come tomorrow to be instructed again.

## 2022-06-15 NOTE — Assessment & Plan Note (Signed)
Continue IV Unasyn and switch to Augmentin at discharge

## 2022-06-15 NOTE — TOC Progression Note (Signed)
Transition of Care (TOC) - Progression Note    Patient Details  Name: Christina Porter Dhaliwal MRN: [id number] Date of Birth: 04/20/1961  Transition of Care [facility]) CM/SW Contact  Beverly Sessions, RN Phone Number: 06/15/2022, 4:16 PM  Clinical Narrative:     Met with patient at bedside she wishes to return home where she lives with her daughter [name].  Patient needs trach supplies, o2, suction, tube feeds, BSC, RW.  Patient states she does not need a hospital bed  Son and daughter to bedside for trach education.  Son completed educated, and daughter is to come back tomorrow.   DME orders sent to [facility] with [employer].  He is to complete a paper order for Tube feeds and Trach for MD to sign   Spoke with Rob for Private duty Nursing through [place]  [phone]  Per Rob patient would potentially qualify for PDN hours. [facility] faxed to [place] at [phone].  He will type up an order set for MD to sign  TOC to follow up on orders and MD signature     Expected Discharge Plan: Home/Self Care Barriers to Discharge: Continued Medical Work up  Expected Discharge Plan and Services Expected Discharge Plan: Home/Self Care       Living arrangements for the past 2 months: Apartment                                       Social Determinants of Health (SDOH) Interventions    Readmission Risk Interventions     No data to display

## 2022-06-15 NOTE — Progress Notes (Signed)
ID No change in condition Still has some blood mixed saliva  O/e awake Patient Vitals for the past 24 hrs:  BP Temp Temp src Pulse Resp SpO2 Weight  06/15/22 1548 95/73 98.2 F (36.8 C) -- 100 18 100 % --  06/15/22 1200 91/75 98.1 F (36.7 C) Axillary (!) 101 16 100 % --  06/15/22 0800 91/69 98.5 F (36.9 C) Axillary (!) 105 18 99 % --  06/15/22 0507 -- -- -- -- -- 99 % --  06/15/22 0405 -- -- -- -- -- -- 41.8 kg  06/15/22 0403 (!) 89/53 -- -- 83 -- -- --  06/15/22 0403 (!) 89/53 -- -- -- -- -- --  06/15/22 0400 (!) 84/49 98.9 F (37.2 C) Oral (!) 104 12 100 % --  06/14/22 2054 -- -- -- -- -- 100 % --  06/14/22 2000 -- -- -- -- -- 100 % --  06/14/22 1955 (!) 92/59 99.7 F (37.6 C) Oral (!) 101 16 100 % --  06/14/22 1637 96/62 99.8 F (37.7 C) -- (!) 102 16 100 % --   No resp distress Minimal bloody  secretions from tracheostomy site Left neck fungating mass B/l air entry- rhonchi Labs    Latest Ref Rng & Units 06/15/2022    4:29 AM 06/13/2022    4:28 AM 06/12/2022    3:21 AM  CBC  WBC 4.0 - 10.5 K/uL 14.8  16.5  12.6   Hemoglobin 12.0 - 15.0 g/dL 8.7  9.1  9.1   Hematocrit 36.0 - 46.0 % 26.3  27.5  27.1   Platelets 150 - 400 K/uL 657  481  437        Latest Ref Rng & Units 06/15/2022    4:29 AM 06/14/2022    5:44 AM 06/13/2022    4:28 AM  CMP  Glucose 70 - 99 mg/dL 154  107  143   BUN 8 - 23 mg/dL '14  17  13   '$ Creatinine 0.44 - 1.00 mg/dL 0.44  0.46  0.41   Sodium 135 - 145 mmol/L 134  133  130   Potassium 3.5 - 5.1 mmol/L 3.7  3.9  3.4   Chloride 98 - 111 mmol/L 101  102  97   CO2 22 - 32 mmol/L '25  26  25   '$ Calcium 8.9 - 10.3 mg/dL 8.3  8.0  8.1     Impression/recommendation 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  Also extension into soft tissue of the neck with obstruction of left IJ and also compression of the ICA. 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 Patient leukocytosis secondary to the fungating mass as well as aspiration. On unasyn- may change to peg augmentin once leucocytosis resolve- because of extensive necrotic tumor in the mouth risk for infection and may need long term po antibiotic   Acute hypoxic respiratory failure secondary to the above   COPD  Current smoker  Anemia ? Discussed the management with the care team ID will sign off- call if needed

## 2022-06-16 DIAGNOSIS — J9601 Acute respiratory failure with hypoxia: Secondary | ICD-10-CM | POA: Diagnosis not present

## 2022-06-16 DIAGNOSIS — I82C12 Acute embolism and thrombosis of left internal jugular vein: Secondary | ICD-10-CM | POA: Diagnosis not present

## 2022-06-16 DIAGNOSIS — R6510 Systemic inflammatory response syndrome (SIRS) of non-infectious origin without acute organ dysfunction: Secondary | ICD-10-CM | POA: Diagnosis not present

## 2022-06-16 DIAGNOSIS — C140 Malignant neoplasm of pharynx, unspecified: Secondary | ICD-10-CM | POA: Diagnosis not present

## 2022-06-16 LAB — BASIC METABOLIC PANEL
Anion gap: 6 (ref 5–15)
BUN: 18 mg/dL (ref 8–23)
CO2: 25 mmol/L (ref 22–32)
Calcium: 8.4 mg/dL — ABNORMAL LOW (ref 8.9–10.3)
Chloride: 103 mmol/L (ref 98–111)
Creatinine, Ser: 0.52 mg/dL (ref 0.44–1.00)
GFR, Estimated: >60 mL/min (ref >60)
Glucose, Bld: 124 mg/dL — ABNORMAL HIGH (ref 70–99)
Potassium: 3.7 mmol/L (ref 3.5–5.1)
Sodium: 134 mmol/L — ABNORMAL LOW (ref 135–145)

## 2022-06-16 LAB — GLUCOSE, CAPILLARY
Glucose-Capillary: 125 mg/dL — ABNORMAL HIGH (ref 70–99)
Glucose-Capillary: 133 mg/dL — ABNORMAL HIGH (ref 70–99)
Glucose-Capillary: 134 mg/dL — ABNORMAL HIGH (ref 70–99)
Glucose-Capillary: 99 mg/dL (ref 70–99)
Glucose-Capillary: 117 mg/dL — ABNORMAL HIGH (ref 70–99)
Glucose-Capillary: 128 mg/dL — ABNORMAL HIGH (ref 70–99)

## 2022-06-16 LAB — CBC
HCT: 26.5 % — ABNORMAL LOW (ref 36.0–46.0)
Hemoglobin: 8.5 g/dL — ABNORMAL LOW (ref 12.0–15.0)
MCH: 30.1 pg (ref 26.0–34.0)
MCHC: 32.1 g/dL (ref 30.0–36.0)
MCV: 94 fL (ref 80.0–100.0)
Platelets: 670 10*3/uL — ABNORMAL HIGH (ref 150–400)
RBC: 2.82 MIL/uL — ABNORMAL LOW (ref 3.87–5.11)
RDW: 13.4 % (ref 11.5–15.5)
WBC: 14.7 10*3/uL — ABNORMAL HIGH (ref 4.0–10.5)
nRBC: 0 % (ref 0.0–0.2)

## 2022-06-16 LAB — PHOSPHORUS: Phosphorus: 3.5 mg/dL (ref 2.5–4.6)

## 2022-06-16 LAB — MAGNESIUM: Magnesium: 1.6 mg/dL — ABNORMAL LOW (ref 1.7–2.4)

## 2022-06-16 MED ORDER — FREE WATER
60.0000 mL | Freq: Every day | Status: DC
  Administered 2022-06-16 – 2022-06-20 (×25): 60 mL

## 2022-06-16 MED ORDER — OSMOLITE 1.2 CAL PO LIQD
237.0000 mL | Freq: Every day | ORAL | Status: DC
  Administered 2022-06-16 – 2022-06-20 (×25): 237 mL

## 2022-06-16 NOTE — Assessment & Plan Note (Signed)
Continue current pain medicine regimen

## 2022-06-16 NOTE — Progress Notes (Signed)
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 Augmentin with plan for follow-up at [facility], with radiation starting on 05/30/2022.  She was seen in [facility] ED on 06/02/2022 for hypoxia and shortness of breath due to concern for aspiration pneumonia, unfortunately she left AMA.  She later presented to [facility] ED on the same day, but also left AMA. Presented again to [facility] ED 06/03/2022 w/ tachycardia/tachypnea.   Patient was admitted for sepsis secondary to possible UTI vs neck mass, started on IV fluids and IV antibiotics. 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   8/6: Admitted by Hospitalist 8/7: ENT & ID consulted. 8/8: Oncology consulted. Concern for aspiration 8/9: Voice becoming hoarse. Palliative Care consulted 8/10: Some SOB noted. Vascular Surgery consulted. 8/11: Rapid response called due to recurrent bleeding with development of acute respiratory distress.  ENT performed emergent Tracheostomy, Vascular Surgery performed coil embolization of first 3 external carotid artery branches and placed Left Carotid Stent.  PCCM consulted for vent management 8/12 attempted SBT, failed initial attempt secondary to tachypnea and hypoxia.  Later able to tolerate TC  8/13: Received 1 unit pRBC's overnight. Tolerating TC trials 8/15:  transfer back to hospitalist service. D/w [name] see below. Spoke w/ son, see separate progress note, plan for meeting tomorrow. See progress note from 8/15 re d/w oncology team and ENT at [facility].  8/16: ambulating, urinating, tolerating trach w/ O2 support. Son has not shown up as of 3 pm. Pt appears to be progressing, will consult TOC for DME/HH if we can arrange this and no further bleeding may be appropriate to go home if patient ans her son are amenable  8/17-8/18: Family education for DME and trach care provided.  Waiting on all DME to be set up before discharge  [place]: Per palliative care note 08/14 and c/w multiple previous discussions w/ palliative care: oldest son is her preferred decision-maker. "She indicates  that she will undergo whatever is needed to try to add every single extra day she can get on this earth... Full code and full scope. Patient would like any and all care indicated for as long as possible regardless of pain or suffering, without limit."  Since patient follows up with hem-onc at [facility] (family is hopeful for treatment there to be helpful), her oncologist Dr Harriette Ohara at [facility] 06/06/2022 reported they would accept patient if/when bed available. Multiple attempts have been made to transfer the patient to [facility] but no bed available.     Assessment and Plan: * Acute respiratory failure with hypoxia ([place]) Now resolved.  SIRS (systemic inflammatory response syndrome) (HCC) sepsis ruled out No definite source of infection noted but temperature 99.8 with pulse 116-124 respirations 18-29 and initially hypotensive at 95/61 responding to fluids.  CBC pending but was elevated on 8/4.  Procalcitonin 1.13 On IV Unasyn for aspiration pneumonia.  Switch to oral Augmentin  at discharge per ID.   Internal jugular vein thrombosis, left (HCC) Continue heparin flushes Was hospitalized from 7/17 to 7/21 with concern for septic thrombophlebitis.  Blood cultures were negative.  Was treated with Unasyn and  then Augmentin to complete a 2-week course ID following.  Throat cancer ([place]) soft palate SCC; left neck FNA positive for malignancy consistent with metastatic SCC.  PET scan 12/15/2020 also notable for local metastatic disease - follows with Dr. Harriette Ohara at [facility] - s/p chemo (cisplatin) -Recently contacted by radiation oncology (Dr. Harriette Ohara) 05/12/2022.  They are trying to reschedule study injection and simulation appointments; she has missed prior appointments. Overall poor prognosis.  Possible Seizure disorder (HCC) On gabapentin 600 mg 3 times daily   Bipolar 1 disorder ([place]) Resume home meds pending med rec  Aspiration pneumonia ([place]) Continue IV Unasyn and switch to Augmentin at discharge.  Cancer associated pain Continue current pain medicine regimen   Abnormal urinalysis Patient was given a dose of Rocephin in the ED based on abnormal urinalysis done at [facility] on 8/4 We will continue ceftriaxone given concerns for SIRS and possible sepsis Follow blood and urine cultures  Hyponatremia Improved with hydration.  Protein-calorie malnutrition, severe Complicates overall prognosis.  BMI 16.85.  Tobacco abuse Continue nicotine patch.  Underweight-resolved as of 06/15/2022 BMI 16.94.  Suspect severe protein calorie malnutrition Nutritionist eval        Subjective: Doing well.  No new issues  Physical Exam: Vitals:   06/16/22 0415 06/16/22 0416 06/16/22 0500 06/16/22 0736  BP:  91/61  110/65  Pulse:  85  (!) 106  Resp:  18  14  Temp:  98.2 F (36.8 C)  99.5 F (37.5 C)  TempSrc:  Axillary  Axillary  SpO2: 99% 99%  100%  Weight:   41.7 kg   Height:       General: Frail, deconditioned, cachectic, not in any distress Left neck covered by dressing Cardiovascular: RRR, S1/S2 +, no rubs, no gallops Respiratory: CTA bilaterally, no wheezing, no rhonchi Abdominal: Soft, NT, ND, bowel sounds + Extremities: no edema, no cyanosis Data Reviewed:  There are no new  results to review at this time.  Family Communication: None  Disposition: Status is: Inpatient Remains inpatient appropriate because: Waiting for home health, DME, trach care and supplies coordinated before she could go home.  May be potentially early next week/Monday.  Home health RN is not able to start as quickly and short of staff per [facility]  Planned Discharge Destination: Home with Home Health   DVT prophylaxis-SCDs Time spent: 35 minutes  Author: Max Sane, MD 06/16/2022 2:13 PM  For on call review [email or web address].

## 2022-06-16 NOTE — Assessment & Plan Note (Signed)
Complicates overall prognosis.  BMI 16.85.

## 2022-06-16 NOTE — Progress Notes (Signed)
Nutrition Follow Up Note   DOCUMENTATION CODES:   Underweight, Severe malnutrition in context of chronic illness  INTERVENTION:   Change to Osmolite 1.2- Give six cartons daily via tube- Flush tube with 3m of water before and after each feed.   Regimen provides 1710kcal/day, 79g/day of protein and 15351mday of free water.   Juven Fruit Punch BID via tube, each serving provides 95kcal and 2.5g of protein (amino acids glutamine and arginine)  NUTRITION DIAGNOSIS:   Severe Malnutrition related to chronic illness (stage IV throat cancer) as evidenced by moderate fat depletion, severe fat depletion, moderate muscle depletion, severe muscle depletion. 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  Pt s/p emergent tracheostomy 8/11  Pt currently on room air. Tracheostomy remains in place. Pt tolerating continuous tube feeds at goal rate. Will transition patient over to bolus feeds in preparation for discharge. Per chart, pt has remained weight stable since admission.   Medications reviewed and include: aspirin, plavix, juven, protonix, miralax, unasyn  Labs reviewed: Na 134(L), K 3.7 wnl, P 3.5 wnl, Mg 1.6(L) Wbc- 14.7H), Hgb 8.5(L), Hct 26.5(L) Cbgs- 134, 133, 125 x 24 hrs  Diet Order:   Diet Order             Diet NPO time specified  Diet effective now                  EDUCATION NEEDS:   Education needs have been addressed  Skin:  Skin Assessment: Reviewed RN Assessment  (Stage II sacrum, incision neck)  Last BM:  8/18- type 7  Height:   Ht Readings from Last 1 Encounters:  06/09/22 '5\' 2"'  (1.575 m)    Weight:   Wt Readings from Last 1 Encounters:  06/16/22 41.7 kg    Ideal Body Weight:  50 kg  BMI:  Body mass index is 16.81 kg/m.  Estimated Nutritional Needs:   Kcal:  1500-1700kcal/day  Protein:  75-85g/day  Fluid:  1.3-1.5L/day  CaKoleen DistanceS, RD, LDN Please refer to [facility] Hospitalor RD and/or RD on-call/weekend/after hours pager

## 2022-06-16 NOTE — Assessment & Plan Note (Signed)
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  PET scan 12/15/2020 also notable for local metastatic disease - follows with Dr. Harriette Ohara at [facility] - s/p chemo (cisplatin) -Recently contacted by radiation oncology (Dr. Harriette Ohara) 05/12/2022.  They are trying to reschedule study injection and simulation appointments; she has missed prior appointments. Overall poor prognosis.

## 2022-06-16 NOTE — TOC Progression Note (Addendum)
Transition of Care (TOC) - Progression Note    Patient Details  Name: Christina Porter MRN: [id number] Date of Birth: 03/28/1961  Transition of Care [facility]) CM/SW [place], LCSW Phone Number: 06/16/2022, 8:59 AM  Clinical Narrative:    CSW asked Suanne Marker and Thedore Mins with [employer] for order forms needed. CSW called Rob with [facility] who stated he is waiting to hear from their office on if they have staffing. Rob stated he should hear back today, if not it would be Monday before he has an answer. Rob stated once he knows about staffing, he can send the order forms needed.  10:17- DME order form securely emailed to [facility] with [employer]. Asked MD for RW orders as well.   11:12- Additional DME order forms signed by MD and securely emailed to [facility] with [employer].   Call from [place] with [facility] who stated their office has accepted patient but has not told him the number of hours they can provide yet. Rob stated after he gets this info, he will have to submit forms to patient's Medicaid which takes 7-14 days to approve it, so home health won't be able to start until this is approved by Medicaid. At minimum, he said they would not be able to start for 7 days, but it will likely be more like 14 days. He stated if MD feels that family can care for patient safely at home while waiting for insurance approval, he can follow up while patient is at home, but if not patient will need to remain in hospital until they get the approval. Notified MD.  Leonor Liv requested additional clinicals with info about trach placement as well as DME order forms be sent, CSW sent via fax.   4:07- RD to call Thedore Mins with [employer] to  make sure patient is getting the right home set up for patient's feedings.    Expected Discharge Plan: Home/Self Care Barriers to Discharge: Continued Medical Work up  Expected Discharge Plan and Services Expected Discharge Plan: Home/Self Care       Living arrangements for the past 2 months:  Apartment                                       Social Determinants of Health (SDOH) Interventions    Readmission Risk Interventions     No data to display

## 2022-06-16 NOTE — Assessment & Plan Note (Signed)
sepsis ruled out No definite source of infection noted but temperature 99.8 with pulse 116-124 respirations 18-29 and initially hypotensive at 95/61 responding to fluids.  CBC pending but was elevated on 8/4.  Procalcitonin 1.13 On IV Unasyn for aspiration pneumonia.  Switch to oral Augmentin at discharge per ID.

## 2022-06-16 NOTE — Assessment & Plan Note (Signed)
Continue heparin flushes Was hospitalized from 7/17 to 7/21 with concern for septic thrombophlebitis.  Blood cultures were negative.  Was treated with Unasyn and then Augmentin to complete a 2-week course ID following.

## 2022-06-16 NOTE — Assessment & Plan Note (Signed)
Improved with hydration.

## 2022-06-16 NOTE — Assessment & Plan Note (Signed)
Continue IV Unasyn and switch to Augmentin at discharge.

## 2022-06-16 NOTE — Progress Notes (Signed)
PT Cancellation Note  Patient Details Name: Christina Porter Christina Porter MRN: [id number] DOB: 07-03-1961   Cancelled Treatment:    Reason Eval/Treat Not Completed:  Patient declined due to fatigue. She was aware of anticipated discharge home. PT will continue to follow while in the hospital.  Minna Merritts, PT, MPT  Percell Locus 06/16/2022, 11:11 AM

## 2022-06-16 NOTE — Progress Notes (Signed)
Patient deferred dressing change for later today. Education was provided regarding the risk for infection.

## 2022-06-16 NOTE — Assessment & Plan Note (Signed)
Now resolved.  

## 2022-06-16 NOTE — Assessment & Plan Note (Signed)
Continue nicotine patch  °

## 2022-06-17 DIAGNOSIS — J9601 Acute respiratory failure with hypoxia: Secondary | ICD-10-CM | POA: Diagnosis not present

## 2022-06-17 LAB — CBC
HCT: 23.4 % — ABNORMAL LOW (ref 36.0–46.0)
Hemoglobin: 7.8 g/dL — ABNORMAL LOW (ref 12.0–15.0)
MCH: 30.6 pg (ref 26.0–34.0)
MCHC: 33.3 g/dL (ref 30.0–36.0)
MCV: 91.8 fL (ref 80.0–100.0)
Platelets: 685 10*3/uL — ABNORMAL HIGH (ref 150–400)
RBC: 2.55 MIL/uL — ABNORMAL LOW (ref 3.87–5.11)
RDW: 13.2 % (ref 11.5–15.5)
WBC: 16.7 10*3/uL — ABNORMAL HIGH (ref 4.0–10.5)
nRBC: 0 % (ref 0.0–0.2)

## 2022-06-17 LAB — BASIC METABOLIC PANEL
Anion gap: 7 (ref 5–15)
BUN: 18 mg/dL (ref 8–23)
CO2: 24 mmol/L (ref 22–32)
Calcium: 8.5 mg/dL — ABNORMAL LOW (ref 8.9–10.3)
Chloride: 101 mmol/L (ref 98–111)
Creatinine, Ser: 0.51 mg/dL (ref 0.44–1.00)
GFR, Estimated: >60 mL/min (ref >60)
Glucose, Bld: 111 mg/dL — ABNORMAL HIGH (ref 70–99)
Potassium: 3.5 mmol/L (ref 3.5–5.1)
Sodium: 132 mmol/L — ABNORMAL LOW (ref 135–145)

## 2022-06-17 LAB — GLUCOSE, CAPILLARY
Glucose-Capillary: 106 mg/dL — ABNORMAL HIGH (ref 70–99)
Glucose-Capillary: 126 mg/dL — ABNORMAL HIGH (ref 70–99)
Glucose-Capillary: 132 mg/dL — ABNORMAL HIGH (ref 70–99)
Glucose-Capillary: 149 mg/dL — ABNORMAL HIGH (ref 70–99)
Glucose-Capillary: 150 mg/dL — ABNORMAL HIGH (ref 70–99)
Glucose-Capillary: 99 mg/dL (ref 70–99)

## 2022-06-17 LAB — PHOSPHORUS: Phosphorus: 3.6 mg/dL (ref 2.5–4.6)

## 2022-06-17 LAB — MAGNESIUM: Magnesium: 1.5 mg/dL — ABNORMAL LOW (ref 1.7–2.4)

## 2022-06-17 MED ORDER — ENOXAPARIN SODIUM 30 MG/0.3ML IJ SOSY
30.0000 mg | PREFILLED_SYRINGE | INTRAMUSCULAR | Status: DC
  Administered 2022-06-17 – 2022-06-19 (×3): 30 mg via SUBCUTANEOUS
  Filled 2022-06-17 (×3): qty 0.3

## 2022-06-17 MED ORDER — MAGNESIUM SULFATE 2 GM/50ML IV SOLN
2.0000 g | Freq: Once | INTRAVENOUS | Status: AC
  Administered 2022-06-17: 2 g via INTRAVENOUS
  Filled 2022-06-17: qty 50

## 2022-06-17 NOTE — Progress Notes (Signed)
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 Augmentin with plan for follow-up at [facility], with radiation starting on 05/30/2022.  She was seen in [facility] ED on 06/02/2022 for hypoxia and shortness of breath due to concern for aspiration pneumonia, unfortunately she left AMA.  She later presented to [facility] ED on the same day, but also left AMA. Presented again to [facility] ED 06/03/2022 w/ tachycardia/tachypnea.   Patient was admitted for sepsis secondary to possible UTI vs neck mass, started on IV fluids and IV antibiotics. 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   8/6: Admitted by Hospitalist 8/7: ENT & ID consulted. 8/8: Oncology consulted. Concern for aspiration 8/9: Voice becoming hoarse. Palliative Care consulted 8/10: Some SOB noted. Vascular Surgery consulted. 8/11: Rapid response called due to recurrent bleeding with development of acute respiratory distress.  ENT performed emergent Tracheostomy, Vascular Surgery performed coil embolization of first 3 external carotid artery branches and placed Left Carotid Stent.  PCCM consulted for vent management 8/12 attempted SBT, failed initial attempt secondary to tachypnea and hypoxia.  Later able to tolerate TC  8/13: Received 1 unit pRBC's overnight. Tolerating TC trials 8/15: transfer back to hospitalist service. D/w [name]  see below. Spoke w/ son, see separate progress note, plan for meeting tomorrow. See progress note from 8/15 re d/w oncology team and ENT at [facility].  8/16: ambulating, urinating, tolerating trach w/ O2 support. Son has not shown up as of 3 pm. Pt appears to be progressing, will consult TOC for DME/HH if we can arrange this and no further bleeding may be appropriate to go home if patient ans her son are amenable  8/17-8/18: Family education for DME and trach care provided.  Waiting on all DME to be set up before discharge  [place]: Per palliative care note 08/14 and c/w multiple previous discussions w/ palliative care: oldest son is her preferred decision-maker. "She indicates  that she will undergo whatever is needed to try to add every single extra day she can get on this earth... Full code and full scope. Patient would like any and all care indicated for as long as possible regardless of pain or suffering, without limit."  Since patient follows up with hem-onc at [facility] (family is hopeful for treatment there to be helpful), her oncologist Dr Harriette Ohara at [facility] 06/06/2022 reported they would accept patient if/when bed available. Multiple attempts have been made to transfer the patient to [facility] but no bed available.      Assessment & Plan:   Principal Problem:   Acute respiratory failure with hypoxia (HCC) Active Problems:   SIRS (systemic inflammatory response syndrome) (HCC)   Throat cancer (HCC)   Internal jugular vein thrombosis, left (HCC)   Possible Seizure disorder (HCC)   Bipolar 1 disorder (HCC)   Tobacco abuse   Protein-calorie malnutrition, severe   Hyponatremia   Abnormal urinalysis   Cancer associated pain  Pressure injury of skin   Aspiration pneumonia (HCC)  Assessment and Plan:   Acute respiratory failure with hypoxia ([place]) Now resolved.   SIRS (systemic inflammatory response syndrome) (HCC) sepsis ruled out No definite source of infection noted but temperature 99.8 with pulse 116-124  respirations 18-29 and initially hypotensive at 95/61 responding to fluids.  CBC pending but was elevated on 8/4.  Procalcitonin 1.13 On IV Unasyn for aspiration pneumonia.  Switch to oral Augmentin at discharge per ID.     Internal jugular vein thrombosis, left (HCC) Continue heparin flushes Was hospitalized from 7/17 to 7/21 with concern for septic thrombophlebitis.  Blood cultures were negative.  Was treated with Unasyn and then Augmentin likely indefinitely per ID ID s/o for now, call with questions.   Throat cancer ([place]) soft palate SCC; left neck FNA positive for malignancy consistent with metastatic SCC.  PET scan 12/15/2020 also notable for local metastatic disease - follows with Dr. Harriette Ohara at [facility] - s/p chemo (cisplatin) -Recently contacted by radiation oncology (Dr. Harriette Ohara) 05/12/2022.  They are trying to reschedule study injection and simulation appointments; she has missed prior appointments. Overall poor prognosis.   Possible Seizure disorder (HCC) On gabapentin 600 mg 3 times daily     Bipolar 1 disorder ([place]) Resume home meds pending med rec   Aspiration pneumonia ([place]) Continue IV Unasyn and switch to Augmentin at discharge per ID recommendations   Cancer associated pain Continue current pain medicine regimen     Abnormal urinalysis Patient was given a dose of Rocephin in the ED based on abnormal urinalysis done at [facility] on 8/4 We will continue ceftriaxone given concerns for SIRS and possible sepsis Follow blood and urine cultures   Hyponatremia Improved with hydration. Follow BMP in am   Protein-calorie malnutrition, severe Complicates overall prognosis.  BMI 16.85.   Tobacco abuse Continue nicotine patch.  Hypomagnesemia Replete and reevaluate in a.m.   Underweight-resolved as of 06/15/2022 BMI 16.94.  Suspect severe protein calorie malnutrition Nutritionist eval     DVT prophylaxis: Lovenox Code Status: Full Family Communication: None at  bedside Disposition Plan: Awaiting home health arrangements/equipment prior to discharge hopefully 8/21 Status is: Inpatient Remains inpatient appropriate because: Need for IV medications.   Nutritional Assessment:  The patient's BMI is: Body mass index is 15.52 kg/m.Marland Kitchen  Seen by dietician.  I agree with the assessment and plan as outlined below:  Nutrition Status: Nutrition Problem: Severe Malnutrition Etiology: chronic illness (stage IV throat cancer) Signs/Symptoms: moderate fat depletion, severe fat depletion, moderate muscle depletion, severe muscle depletion Interventions: MVI, Tube feeding  .    Skin Assessment:  I have examined the patient's skin and I agree with the wound assessment as performed by the wound care RN as outlined below:  Pressure Injury 06/07/22 Sacrum Mid;Upper Stage 2 -  Partial thickness loss of dermis presenting as a shallow open injury with a red, pink wound bed without slough. pink open skin painful (Active)  06/07/22 0229  Location: Sacrum  Location Orientation: Mid;Upper  Staging: Stage 2 -  Partial thickness loss of dermis presenting as a shallow open injury with a red, pink wound bed without slough.  Wound Description (Comments): pink open skin painful  Present on Admission:   Dressing Type Foam - Lift dressing to assess site every shift 06/17/22 0858    Consultants:  ID  Procedures:  See above  Antimicrobials:  Anti-infectives (From admission, onward)    Start     Dose/Rate Route Frequency Ordered Stop  06/09/22 0748  ceFAZolin (ANCEF) IVPB 2g/100 mL premix  Status:  Discontinued        2 g 200 mL/hr over 30 Minutes Intravenous 30 min pre-op 06/09/22 0748 06/09/22 0914   06/08/22 0600  Ampicillin-Sulbactam (UNASYN) 3 g in sodium chloride 0.9 % 100 mL IVPB        3 g 200 mL/hr over 30 Minutes Intravenous Every 6 hours 06/07/22 1547     06/06/22 2200  fluconazole (DIFLUCAN) IVPB 200 mg  Status:  Discontinued        200 mg 100  mL/hr over 60 Minutes Intravenous Every 24 hours 06/05/22 1622 06/10/22 1506   06/05/22 2200  piperacillin-tazobactam (ZOSYN) IVPB 3.375 g        3.375 g 12.5 mL/hr over 240 Minutes Intravenous Every 8 hours 06/05/22 1622 06/07/22 2359   06/05/22 1800  fluconazole (DIFLUCAN) IVPB 400 mg        400 mg 100 mL/hr over 120 Minutes Intravenous  Once 06/05/22 1622 06/07/22 0207   06/05/22 1545  Ampicillin-Sulbactam (UNASYN) 3 g in sodium chloride 0.9 % 100 mL IVPB  Status:  Discontinued        3 g 200 mL/hr over 30 Minutes Intravenous Every 6 hours 06/05/22 1450 06/05/22 1622   06/04/22 0115  cefTRIAXone (ROCEPHIN) 2 g in sodium chloride 0.9 % 100 mL IVPB  Status:  Discontinued        2 g 200 mL/hr over 30 Minutes Intravenous Every 24 hours 06/04/22 0102 06/05/22 1449   06/04/22 0045  cefTRIAXone (ROCEPHIN) 1 g in sodium chloride 0.9 % 100 mL IVPB  Status:  Discontinued        1 g 200 mL/hr over 30 Minutes Intravenous  Once 06/04/22 0030 06/04/22 0109       Subjective: Patient seen and evaluated today with complaints of ongoing pain.  No acute overnight events noted.  Objective: Vitals:   06/16/22 1923 06/17/22 0354 06/17/22 0357 06/17/22 0858  BP: 110/79 (!) 92/55  114/65  Pulse: (!) 103 98  (!) 113  Resp: '20 16  18  '$ Temp: 99.8 F (37.7 C) 98.2 F (36.8 C)  98.6 F (37 C)  TempSrc: Oral Oral  Oral  SpO2: 100% 100%  100%  Weight:   38.5 kg   Height:        Intake/Output Summary (Last 24 hours) at 06/17/2022 0947 Last data filed at 06/17/2022 0152 Gross per 24 hour  Intake 2662.79 ml  Output --  Net 2662.79 ml   Filed Weights   06/15/22 0405 06/16/22 0500 06/17/22 0357  Weight: 41.8 kg 41.7 kg 38.5 kg    Examination:  General exam: Appears calm and comfortable  Respiratory system: Clear to auscultation. Respiratory effort normal.  Tracheostomy present C/D/I Cardiovascular system: S1 & S2 heard, RRR.  Gastrointestinal system: Abdomen is soft Central nervous system:  Alert and awake Extremities: No edema Skin: No significant lesions noted Psychiatry: Flat affect.    Data Reviewed: I have personally reviewed following labs and imaging studies  CBC: Recent Labs  Lab 06/12/22 0321 06/13/22 0428 06/15/22 0429 06/16/22 0501 06/17/22 0505  WBC 12.6* 16.5* 14.8* 14.7* 16.7*  NEUTROABS  --  14.0*  --   --   --   HGB 9.1* 9.1* 8.7* 8.5* 7.8*  HCT 27.1* 27.5* 26.3* 26.5* 23.4*  MCV 90.0 91.7 92.3 94.0 91.8  PLT 437* 481* 657* 670* 408*   Basic Metabolic Panel: Recent Labs  Lab 06/13/22 0428 06/14/22 0544 06/15/22  7048 06/16/22 0501 06/17/22 0505  NA 130* 133* 134* 134* 132*  K 3.4* 3.9 3.7 3.7 3.5  CL 97* 102 101 103 101  CO2 '25 26 25 25 24  '$ GLUCOSE 143* 107* 154* 124* 111*  BUN '13 17 14 18 18  '$ CREATININE 0.41* 0.46 0.44 0.52 0.51  CALCIUM 8.1* 8.0* 8.3* 8.4* 8.5*  MG 1.2* 1.8 2.0 1.6* 1.5*  PHOS 3.7 3.3 3.4 3.5 3.6   GFR: Estimated Creatinine Clearance: 44.9 mL/min (by C-G formula based on SCr of 0.51 mg/dL). Liver Function Tests: No results for input(s): "AST", "ALT", "ALKPHOS", "BILITOT", "PROT", "ALBUMIN" in the last 168 hours. No results for input(s): "LIPASE", "AMYLASE" in the last 168 hours. No results for input(s): "AMMONIA" in the last 168 hours. Coagulation Profile: No results for input(s): "INR", "PROTIME" in the last 168 hours. Cardiac Enzymes: No results for input(s): "CKTOTAL", "CKMB", "CKMBINDEX", "TROPONINI" in the last 168 hours. BNP (last 3 results) No results for input(s): "PROBNP" in the last 8760 hours. HbA1C: No results for input(s): "HGBA1C" in the last 72 hours. CBG: Recent Labs  Lab 06/16/22 1655 06/16/22 1943 06/16/22 2328 06/17/22 0349 06/17/22 0811  GLUCAP 99 117* 128* 106* 99   Lipid Profile: No results for input(s): "CHOL", "HDL", "LDLCALC", "TRIG", "CHOLHDL", "LDLDIRECT" in the last 72 hours. Thyroid Function Tests: No results for input(s): "TSH", "T4TOTAL", "FREET4", "T3FREE", "THYROIDAB"  in the last 72 hours. Anemia Panel: No results for input(s): "VITAMINB12", "FOLATE", "FERRITIN", "TIBC", "IRON", "RETICCTPCT" in the last 72 hours. Sepsis Labs: No results for input(s): "PROCALCITON", "LATICACIDVEN" in the last 168 hours.  No results found for this or any previous visit (from the past 240 hour(s)).       Radiology Studies: No results found.      Scheduled Meds:  aspirin  81 mg Per Tube Daily   Chlorhexidine Gluconate Cloth  6 each Topical Daily   clopidogrel  75 mg Per Tube Daily   feeding supplement (OSMOLITE 1.2 CAL)  237 mL Per Tube 6 X Daily   free water  60 mL Per Tube 6 X Daily   gabapentin  600 mg Per Tube TID   morphine  10 mg Per Tube Q6H   nutrition supplement (JUVEN)  1 packet Per Tube BID BM   mouth rinse  15 mL Mouth Rinse 4 times per day   pantoprazole  40 mg Per Tube Daily   polyethylene glycol  17 g Per Tube Daily   QUEtiapine  100 mg Per Tube QHS   sodium chloride flush  10-40 mL Intracatheter Q12H   traZODone  100 mg Per Tube QHS   Continuous Infusions:  sodium chloride 250 mL (06/09/22 1359)   sodium chloride 75 mL/hr at 06/17/22 0550   ampicillin-sulbactam (UNASYN) IV 3 g (06/17/22 0503)   magnesium sulfate bolus IVPB 2 g (06/17/22 0903)     LOS: 13 days    Time spent: 35 minutes    Dora Simeone Darleen Crocker, DO [facility]  If 7PM-7AM, please contact night-coverage [email or web address] 06/17/2022, 9:47 AM

## 2022-06-17 NOTE — Progress Notes (Addendum)
Occupational Therapy Treatment Patient Details Name: [name] R. Stepanie Graver MRN: [id number] DOB: 22-Jun-1961 Today's Date: 06/17/2022   History of present illness Pt is a 61 y/o F admitted on 06/03/22 after presenting to the ED with c/o SOB & confusion. 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 Pt developed acute respiratory distress 2/2 hemoptysis requiring emergent tracheostomy for airway protection, carotid artery emobilization & carotid stent placement. PMH: squamous cell carcinoma of soft palate on L s/p chemoradiation (May 2022)   OT comments  [name] presents today with very thick secretions from trach. Following set up, she is able to clean up at bed level with Min A. She requires CGA/close SUPV for safety for transfer to/from [facility]. Pt indicates (writing in notebook) that she has a new 8/10 pain today, along R ribs. Notified RN and MD, repositioned for comfort. Will continue to follow POC.   Recommendations for follow up therapy are one component of a multi-disciplinary discharge planning process, led by the attending physician.  Recommendations may be updated based on patient status, additional functional criteria and insurance authorization.    Follow Up Recommendations  Home health OT    Assistance Recommended at Discharge Frequent or constant Supervision/Assistance  Patient can return home with the following  A little help with walking and/or transfers;A lot of help with bathing/dressing/bathroom;Assistance with cooking/housework;Help with stairs or ramp for entrance;Assist for transportation;Other (comment);Assistance with feeding   Equipment Recommendations  BSC/3in1;Hospital bed    Recommendations for Other Services      Precautions  / Restrictions Precautions Precautions: Fall Precaution Comments: trach, PEG Restrictions Weight Bearing Restrictions: No       Mobility Bed Mobility Overal bed mobility: Needs Assistance Bed Mobility: Supine to Sit, Sit to Supine     Supine to sit: Min guard Sit to supine: Min guard        Transfers Overall transfer level: Needs assistance Equipment used: None Transfers: Sit to/from Stand, Bed to chair/wheelchair/BSC Sit to Stand: Min guard Stand pivot transfers: Min guard   Step pivot transfers: Min guard           Balance Overall balance assessment: Needs assistance Sitting-balance support: Feet supported Sitting balance-Leahy Scale: Good     Standing balance support: No upper extremity supported Standing balance-Leahy Scale: Fair                             ADL either performed or assessed with clinical judgement   ADL Overall ADL's : Needs assistance/impaired   Eating/Feeding Details (indicate cue type and reason): Pt is NPO Grooming: Bed level;Wash/dry face;Wash/dry hands;Set up;Minimal assistance                   Toilet Transfer: Supervision/safety;Stand-pivot;BSC/3in1   Toileting- Clothing Manipulation and Hygiene: Supervision/safety;Sitting/lateral lean              Extremity/Trunk Assessment Upper Extremity Assessment Upper Extremity Assessment: Generalized weakness   Lower Extremity Assessment Lower Extremity Assessment: Generalized weakness        Vision       Perception     Praxis      Cognition Arousal/Alertness: Awake/alert Behavior During Therapy: WFL for tasks assessed/performed Overall Cognitive Status: Within Functional Limits for tasks  assessed                                 General Comments: Pt able to respond to directions consistently, communicate her needs via writing        Exercises Other Exercises Other Exercises: Educ re: relaxation techniques for pain mgmt     Shoulder Instructions       General Comments      Pertinent Vitals/ Pain       Pain Assessment Pain Score: 8  Pain Location: L side of face/neck; R ribs Pain Descriptors / Indicators: Grimacing Pain Intervention(s): Patient requesting pain meds-RN notified, Utilized relaxation techniques, Repositioned, Heat applied  Home Living                                          Prior Functioning/Environment              Frequency  Min 2X/week        Progress Toward Goals  OT Goals(current goals can now be found in the care plan section)  Progress towards OT goals: Progressing toward goals  Acute Rehab OT Goals OT Goal Formulation: With patient Time For Goal Achievement: 06/26/22 Potential to Achieve Goals: Good  Plan Discharge plan needs to be updated;Frequency remains appropriate    Co-evaluation                 AM-PAC OT "6 Clicks" Daily Activity     Outcome Measure   Help from another person eating meals?: Total Help from another person taking care of personal grooming?: A Little Help from another person toileting, which includes using toliet, bedpan, or urinal?: A Lot Help from another person bathing (including washing, rinsing, drying)?: A Lot Help from another person to put on and taking off regular upper body clothing?: A Little Help from another person to put on and taking off regular lower body clothing?: A Lot 6 Click Score: 13    End of Session Equipment Utilized During Treatment: Oxygen  OT Visit Diagnosis: Unsteadiness on feet (R26.81);Muscle weakness (generalized) (M62.81);Pain   Activity Tolerance Patient tolerated treatment well   Patient Left in bed;with call bell/phone within reach;with bed alarm set;with nursing/sitter in room   Nurse Communication Mobility status;Patient requests pain meds        Time: 3559-7416 OT Time Calculation (min): 15 min  Charges: OT General Charges $OT Visit: 1 Visit OT  Treatments $Self Care/Home Management : 8-22 mins Josiah Lobo, PhD, MS, OTR/L 06/17/22, 11:39 AM

## 2022-06-17 NOTE — Progress Notes (Signed)
End of shift note:  Pt remains on schedule morphine and Osmolite through peg tube. Meds were crushed and given. Left neck dressing was changed today. Pt continues on IVF and IV antibiotics.

## 2022-06-18 DIAGNOSIS — J9601 Acute respiratory failure with hypoxia: Secondary | ICD-10-CM | POA: Diagnosis not present

## 2022-06-18 LAB — GLUCOSE, CAPILLARY
Glucose-Capillary: 111 mg/dL — ABNORMAL HIGH (ref 70–99)
Glucose-Capillary: 129 mg/dL — ABNORMAL HIGH (ref 70–99)
Glucose-Capillary: 135 mg/dL — ABNORMAL HIGH (ref 70–99)
Glucose-Capillary: 139 mg/dL — ABNORMAL HIGH (ref 70–99)
Glucose-Capillary: 156 mg/dL — ABNORMAL HIGH (ref 70–99)
Glucose-Capillary: 174 mg/dL — ABNORMAL HIGH (ref 70–99)

## 2022-06-18 LAB — BASIC METABOLIC PANEL
Anion gap: 5 (ref 5–15)
BUN: 14 mg/dL (ref 8–23)
CO2: 26 mmol/L (ref 22–32)
Calcium: 8.3 mg/dL — ABNORMAL LOW (ref 8.9–10.3)
Chloride: 103 mmol/L (ref 98–111)
Creatinine, Ser: 0.56 mg/dL (ref 0.44–1.00)
GFR, Estimated: >60 mL/min (ref >60)
Glucose, Bld: 145 mg/dL — ABNORMAL HIGH (ref 70–99)
Potassium: 3.5 mmol/L (ref 3.5–5.1)
Sodium: 134 mmol/L — ABNORMAL LOW (ref 135–145)

## 2022-06-18 LAB — CBC
HCT: 22.8 % — ABNORMAL LOW (ref 36.0–46.0)
Hemoglobin: 7.3 g/dL — ABNORMAL LOW (ref 12.0–15.0)
MCH: 29.9 pg (ref 26.0–34.0)
MCHC: 32 g/dL (ref 30.0–36.0)
MCV: 93.4 fL (ref 80.0–100.0)
Platelets: 706 10*3/uL — ABNORMAL HIGH (ref 150–400)
RBC: 2.44 MIL/uL — ABNORMAL LOW (ref 3.87–5.11)
RDW: 13.3 % (ref 11.5–15.5)
WBC: 16 10*3/uL — ABNORMAL HIGH (ref 4.0–10.5)
nRBC: 0 % (ref 0.0–0.2)

## 2022-06-18 LAB — MAGNESIUM: Magnesium: 1.7 mg/dL (ref 1.7–2.4)

## 2022-06-18 LAB — PHOSPHORUS: Phosphorus: 3.2 mg/dL (ref 2.5–4.6)

## 2022-06-18 LAB — HEMOGLOBIN AND HEMATOCRIT, BLOOD
HCT: 24.1 % — ABNORMAL LOW (ref 36.0–46.0)
Hemoglobin: 7.7 g/dL — ABNORMAL LOW (ref 12.0–15.0)

## 2022-06-18 MED ORDER — TRANEXAMIC ACID FOR INHALATION: 500.0000 mg | Freq: Once | RESPIRATORY_TRACT | Status: DC

## 2022-06-18 MED ORDER — MORPHINE SULFATE 10 MG/5ML PO SOLN
10.0000 mg | ORAL | Status: AC | PRN
  Administered 2022-06-18: 10 mg
  Filled 2022-06-18: qty 5

## 2022-06-18 MED ORDER — TRANEXAMIC ACID FOR INHALATION
500.0000 mg | Freq: Three times a day (TID) | RESPIRATORY_TRACT | Status: AC
Start: 2022-06-19 — End: 2022-06-19
  Administered 2022-06-19 (×2): 500 mg via RESPIRATORY_TRACT
  Filled 2022-06-18 (×2): qty 10

## 2022-06-18 NOTE — Assessment & Plan Note (Signed)
Continue nicotine patch  °

## 2022-06-18 NOTE — Assessment & Plan Note (Signed)
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  PET scan 12/15/2020 also notable for local metastatic disease - follows with Dr. Harriette Ohara at [facility] - s/p chemo (cisplatin) -Recently contacted by radiation oncology (Dr. Harriette Ohara) 05/12/2022.  They are trying to reschedule study injection and simulation appointments; she has missed prior appointments. Overall poor prognosis.

## 2022-06-18 NOTE — Assessment & Plan Note (Signed)
Continue heparin flushes Was hospitalized from 7/17 to 7/21 with concern for septic thrombophlebitis.  Blood cultures were negative.  Was treated with Unasyn and then Augmentin to complete a 2-week course ID following.

## 2022-06-18 NOTE — Progress Notes (Signed)
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 Augmentin with plan for follow-up at [name] heme-onc, with radiation starting on 05/30/2022.  She was seen in [place] ED on 06/02/2022 for hypoxia and shortness of breath due to concern for aspiration pneumonia, unfortunately she left AMA.  She later presented to [facility] ED on the same day, but also left AMA. Presented again to [facility] ED 06/03/2022 w/ tachycardia/tachypnea.   Patient was admitted for sepsis secondary to possible UTI vs neck mass, started on IV fluids and IV antibiotics. 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   . 8/6: Admitted by Hospitalist . 8/7: ENT & ID consulted. . 8/8: Oncology consulted. Concern for aspiration . 8/9: Voice becoming hoarse. Palliative Care consulted . 8/10: Some SOB noted. Vascular Surgery consulted. . 8/11: Rapid response called due to recurrent bleeding with development of acute respiratory distress.  ENT performed emergent Tracheostomy, Vascular Surgery performed coil embolization of first 3 external carotid artery branches and placed Left Carotid Stent.  PCCM consulted for vent management . 8/12 attempted SBT, failed initial attempt secondary to tachypnea and hypoxia.  Later able to tolerate TC  . 8/13: Received 1 unit pRBC's overnight. Tolerating TC  trials . 8/15: transfer back to hospitalist service. D/w [name] see below. Spoke w/ son, see separate progress note, plan for meeting tomorrow. See progress note from 8/15 re d/w oncology team and ENT at [name].  . 8/16: ambulating, urinating, tolerating trach w/ O2 support. Son has not shown up as of 3 pm. Pt appears to be progressing, will consult TOC for DME/HH if we can arrange this and no further bleeding may be appropriate to go home if patient ans her son are amenable  . 8/17-8/19: Family education for DME and trach care provided.  Waiting on all DME to be set up before discharge  GOC: Per palliative care note 08/14 and c/w multiple previous discussions w/ palliative care: oldest son is her preferred decision-maker. "She indicates  that she will undergo whatever is needed to try to add every single extra day she can get on this earth... Full code and full scope. Patient would like any and all care indicated for as long as possible regardless of pain or suffering, without limit."  Since patient follows up with hem-onc at [name] (family is hopeful for treatment there to be helpful), her oncologist Dr Sheth at [name] 06/06/2022 reported they would accept patient if/when bed available. Multiple attempts have been made to transfer the patient to [name] but no bed available.    8/20: Patient is stable for discharge with trach collar and PEG tube.  Prognosis remains very guarded and poor. Daughter has to learn trach care which will be done tomorrow. Need to have a close follow-up with her [name] providers. Also waiting for equipment to be delivered before discharge.   Assessment and Plan: * Acute   respiratory failure with hypoxia ([place]) Now resolved. Currently saturating well with trach collar. She was on 5 L. -Nursing staff to determine the oxygen need.  Aspiration pneumonia (HCC) Continue IV Unasyn and switch to Augmentin at discharge. Patient will need long-term Augmentin per ID due to persistence aspiration  with fungating mass  Throat cancer ([place]) soft palate SCC; left neck FNA positive for malignancy consistent with metastatic SCC.  PET scan 12/15/2020 also notable for local metastatic disease - follows with Dr. Harriette Ohara at [facility] - s/p chemo (cisplatin) -Recently contacted by radiation oncology (Dr. Harriette Ohara) 05/12/2022.  They are trying to reschedule study injection and simulation appointments; she has missed prior appointments. Overall poor prognosis.  SIRS (systemic inflammatory response syndrome) (HCC) Patient met sepsis criteria so sepsis ruled in with tachycardia, tachypnea and aspiration pneumonia. -Treatment as above   Internal jugular vein thrombosis, left (HCC) Continue heparin flushes Was hospitalized from 7/17 to 7/21 with concern for septic thrombophlebitis.  Blood cultures were negative.  Was treated with Unasyn and then Augmentin to complete a 2-week course ID following.  Possible Seizure disorder (HCC) On gabapentin 600 mg 3 times daily   Bipolar 1 disorder ([place]) - Continue home Seroquel and trazodone  Cancer associated pain Continue current pain medicine regimen   Abnormal urinalysis Patient was given a dose of Rocephin in the ED based on abnormal urinalysis done at [facility] on 8/4 We will continue ceftriaxone given concerns for SIRS and possible sepsis Follow blood and urine cultures  Hyponatremia Improved with hydration.  Malnourished ([place]) Estimated body mass index is 16.25 kg/m as calculated from the following:   Height as of this encounter: 5' 2" (1.575 m).   Weight as of this encounter: 40.3 kg.   Complicates overall prognosis.   Dietitian consult Patient currently is being fed with PEG tube  Tobacco abuse Continue nicotine patch.  Underweight-resolved as of 06/15/2022 BMI 16.94.  Suspect severe protein calorie malnutrition Nutritionist eval     Subjective: Patient was seen and examined today.  No new complaints.  Physical Exam: Vitals:    06/18/22 0757 06/18/22 1144 06/18/22 1551 06/18/22 1606  BP: 98/64 101/63 103/64 102/70  Pulse: (!) 101 (!) 105 (!) 105 (!) 106  Resp: _0 Temp: 98.3 F (36.8 C)  98.3 F (36.8 C) 98 F (36.7 C)  TempSrc: Oral  Oral Oral  SpO2: 100% 100% 100% 100%  Weight:      Height:       General.  Chronically ill-appearing, severely malnourished lady, in no acute distress.  Trach collar in place Pulmonary.  Harsh breath sounds bilaterally, normal respiratory effort. CV.  Regular rate and rhythm, no JVD, rub or murmur. Abdomen.  Soft, nontender, nondistended, BS positive.  PEG tube in place CNS.  Alert and oriented .  No focal neurologic deficit. Extremities.  No edema, no cyanosis, pulses intact and symmetrical. Psychiatry.  Judgment and insight appears normal.  Data Reviewed: Prior data reviewed.  Family Communication:   Disposition: Status is: Inpatient Remains inpatient appropriate because: Severity of illness.   Planned Discharge Destination: Home with Home Health  DVT prophylaxis.  Lovenox Time spent: 45 minutes  This record has been created using Systems analyst. Errors have been sought and corrected,but may not always be located. Such creation errors do not reflect on the standard of care.  Author: Lorella Nimrod, MD 06/18/2022 5:27 PM  For on call review [email or web address].

## 2022-06-18 NOTE — Assessment & Plan Note (Signed)
-   Continue home Seroquel and trazodone

## 2022-06-18 NOTE — Assessment & Plan Note (Signed)
Estimated body mass index is 16.25 kg/m as calculated from the following:   Height as of this encounter: '5\' 2"'$  (1.575 m).   Weight as of this encounter: 40.3 kg.   Complicates overall prognosis.   Dietitian consult Patient currently is being fed with PEG tube

## 2022-06-18 NOTE — Progress Notes (Addendum)
       CROSS COVER NOTE  NAME: Christina Porter MRN: [id number] DOB : 1960/12/24    Date of Service   06/18/22  HPI/Events of Note   2058: Medication request received from nursing for (L) neck and ear pain rated 20/10. Morphine discontinued today.  2326: Notified by nursing that [name] is coughing up blood clots. RN reports [name] has had pink and red tinged secretions all day but has not had clots. See pic of quarter sized clot below. Patient coughed up multiple clots this size per nursing. She has not had a change in respiratory effort and no desaturation events.     Interventions   Plan: Reorder Morphine per tube  H&H--> HGB 7.7 up from 7.3 yesterday Transexamic Acid Nebulized--> with improvement in bleeding and no clots post nebulizer        This document was prepared using Dragon voice recognition software and may include unintentional dictation errors.  Neomia Glass DNP, MHA, FNP-BC Nurse Practitioner [facility] Pager [phone]

## 2022-06-18 NOTE — Assessment & Plan Note (Signed)
Improved with hydration.

## 2022-06-18 NOTE — TOC Progression Note (Signed)
Transition of Care (TOC) - Progression Note    Patient Details  Name: Christina Porter MRN: [id number] Date of Birth: [birth year]  Transition of Care [facility]) CM/SW Contact  Beverly Sessions, RN Phone Number: 06/18/2022, 3:03 PM  Clinical Narrative:     Bedside RN to check ambulating sats on RA today to determine if patient needs O2 delivered to the home, or just compressor for humidification   Expected Discharge Plan: Home/Self Care Barriers to Discharge: Continued Medical Work up  Expected Discharge Plan and Services Expected Discharge Plan: Home/Self Care       Living arrangements for the past 2 months: Apartment                                       Social Determinants of Health (SDOH) Interventions    Readmission Risk Interventions     No data to display

## 2022-06-18 NOTE — Assessment & Plan Note (Signed)
On gabapentin 600 mg 3 times daily

## 2022-06-18 NOTE — TOC Progression Note (Addendum)
Transition of Care (TOC) - Progression Note    Patient Details  Name: Alyissa R. Dorma Altman MRN: [id number] Date of Birth: 29-Nov-1960  Transition of Care [facility]) CM/SW Contact  Beverly Sessions, RN Phone Number: 06/18/2022, 12:20 PM  Clinical Narrative:     Message sent to [place] with [employer] to determine status update on trach supplies, o2, suction, tube feeds, BSC, RW  Message left for son [name] to determine if patient's daughter came back on Friday for trach education  Update: received return call from [place].  He states that his sister was able to watch trach care, but has not had hands on teaching.  They are to come tomorrow at 2 for daughter [name] who lives at home with patient for hands on trach teaching    Expected Discharge Plan: Home/Self Care Barriers to Discharge: Continued Medical Work up  Expected Discharge Plan and Services Expected Discharge Plan: Home/Self Care       Living arrangements for the past 2 months: Apartment                                       Social Determinants of Health (SDOH) Interventions    Readmission Risk Interventions     No data to display

## 2022-06-18 NOTE — Assessment & Plan Note (Signed)
Continue current pain medicine regimen

## 2022-06-18 NOTE — Plan of Care (Signed)
Pt AAOx4, reports severe pain in neck/face. VS are stable but HR is slightly elevated. Plan for pain control and rest. Bed in lowest position, call light within reach. Will continue to monitor.

## 2022-06-18 NOTE — Assessment & Plan Note (Signed)
Continue IV Unasyn and switch to Augmentin at discharge. Patient will need long-term Augmentin per ID due to persistence aspiration with fungating mass

## 2022-06-18 NOTE — Assessment & Plan Note (Signed)
Now resolved. Currently saturating well with trach collar. She was on 5 L. -Nursing staff to determine the oxygen need.

## 2022-06-18 NOTE — Assessment & Plan Note (Addendum)
Patient met sepsis criteria so sepsis ruled in with tachycardia, tachypnea and aspiration pneumonia. -Treatment as above

## 2022-06-19 DIAGNOSIS — C760 Malignant neoplasm of head, face and neck: Secondary | ICD-10-CM | POA: Diagnosis not present

## 2022-06-19 DIAGNOSIS — J9601 Acute respiratory failure with hypoxia: Secondary | ICD-10-CM | POA: Diagnosis not present

## 2022-06-19 DIAGNOSIS — Z7189 Other specified counseling: Secondary | ICD-10-CM | POA: Diagnosis not present

## 2022-06-19 DIAGNOSIS — G893 Neoplasm related pain (acute) (chronic): Secondary | ICD-10-CM | POA: Diagnosis not present

## 2022-06-19 DIAGNOSIS — Z515 Encounter for palliative care: Secondary | ICD-10-CM

## 2022-06-19 DIAGNOSIS — E43 Unspecified severe protein-calorie malnutrition: Secondary | ICD-10-CM | POA: Insufficient documentation

## 2022-06-19 DIAGNOSIS — R042 Hemoptysis: Secondary | ICD-10-CM

## 2022-06-19 LAB — CBC
HCT: 20.9 % — ABNORMAL LOW (ref 36.0–46.0)
Hemoglobin: 6.6 g/dL — ABNORMAL LOW (ref 12.0–15.0)
MCH: 29.6 pg (ref 26.0–34.0)
MCHC: 31.6 g/dL (ref 30.0–36.0)
MCV: 93.7 fL (ref 80.0–100.0)
Platelets: 752 10*3/uL — ABNORMAL HIGH (ref 150–400)
RBC: 2.23 MIL/uL — ABNORMAL LOW (ref 3.87–5.11)
RDW: 13.2 % (ref 11.5–15.5)
WBC: 17.4 10*3/uL — ABNORMAL HIGH (ref 4.0–10.5)
nRBC: 0 % (ref 0.0–0.2)

## 2022-06-19 LAB — PROTIME-INR
INR: 1.1 (ref 0.8–1.2)
Prothrombin Time: 14.3 seconds (ref 11.4–15.2)

## 2022-06-19 LAB — APTT: aPTT: 30 seconds (ref 24–36)

## 2022-06-19 LAB — COMPREHENSIVE METABOLIC PANEL
ALT: 13 U/L (ref 0–44)
AST: 15 U/L (ref 15–41)
Albumin: 2 g/dL — ABNORMAL LOW (ref 3.5–5.0)
Alkaline Phosphatase: 75 U/L (ref 38–126)
Anion gap: 5 (ref 5–15)
BUN: 18 mg/dL (ref 8–23)
CO2: 26 mmol/L (ref 22–32)
Calcium: 8.5 mg/dL — ABNORMAL LOW (ref 8.9–10.3)
Chloride: 101 mmol/L (ref 98–111)
Creatinine, Ser: 0.45 mg/dL (ref 0.44–1.00)
GFR, Estimated: >60 mL/min (ref >60)
Glucose, Bld: 178 mg/dL — ABNORMAL HIGH (ref 70–99)
Potassium: 3.6 mmol/L (ref 3.5–5.1)
Sodium: 132 mmol/L — ABNORMAL LOW (ref 135–145)
Total Bilirubin: <0.1 mg/dL — ABNORMAL LOW (ref 0.3–1.2)
Total Protein: 5.9 g/dL — ABNORMAL LOW (ref 6.5–8.1)

## 2022-06-19 LAB — FIBRINOGEN: Fibrinogen: 762 mg/dL — ABNORMAL HIGH (ref 210–475)

## 2022-06-19 LAB — GLUCOSE, CAPILLARY
Glucose-Capillary: 114 mg/dL — ABNORMAL HIGH (ref 70–99)
Glucose-Capillary: 129 mg/dL — ABNORMAL HIGH (ref 70–99)
Glucose-Capillary: 142 mg/dL — ABNORMAL HIGH (ref 70–99)
Glucose-Capillary: 133 mg/dL — ABNORMAL HIGH (ref 70–99)
Glucose-Capillary: 186 mg/dL — ABNORMAL HIGH (ref 70–99)

## 2022-06-19 LAB — PREPARE RBC (CROSSMATCH)

## 2022-06-19 LAB — PHOSPHORUS: Phosphorus: 3.3 mg/dL (ref 2.5–4.6)

## 2022-06-19 LAB — TYPE AND SCREEN

## 2022-06-19 LAB — MAGNESIUM: Magnesium: 1.5 mg/dL — ABNORMAL LOW (ref 1.7–2.4)

## 2022-06-19 MED ORDER — SODIUM CHLORIDE 0.9% IV SOLUTION: Freq: Once | INTRAVENOUS | Status: DC

## 2022-06-19 MED ORDER — MORPHINE SULFATE (CONCENTRATE) 10 MG/0.5ML PO SOLN
10.0000 mg | Freq: Four times a day (QID) | ORAL | Status: DC
  Administered 2022-06-19 (×4): 10 mg
  Filled 2022-06-19 (×4): qty 0.5

## 2022-06-19 MED ORDER — DEXAMETHASONE SODIUM PHOSPHATE 10 MG/ML IJ SOLN
8.0000 mg | Freq: Two times a day (BID) | INTRAMUSCULAR | Status: DC
  Administered 2022-06-19 – 2022-06-20 (×3): 8 mg via INTRAVENOUS
  Filled 2022-06-19 (×3): qty 1

## 2022-06-19 MED ORDER — MORPHINE SULFATE (CONCENTRATE) 10 MG/0.5ML PO SOLN
15.0000 mg | ORAL | Status: DC | PRN
  Administered 2022-06-19 – 2022-06-20 (×4): 15 mg
  Filled 2022-06-19 (×5): qty 1

## 2022-06-19 MED ORDER — TRANEXAMIC ACID FOR INHALATION
500.0000 mg | Freq: Three times a day (TID) | RESPIRATORY_TRACT | Status: AC
Start: 2022-06-19 — End: 2022-06-19
  Administered 2022-06-19: 500 mg via RESPIRATORY_TRACT
  Filled 2022-06-19 (×2): qty 5

## 2022-06-19 MED ORDER — MAGNESIUM SULFATE 4 GM/100ML IV SOLN
4.0000 g | Freq: Once | INTRAVENOUS | Status: AC
  Administered 2022-06-19: 4 g via INTRAVENOUS
  Filled 2022-06-19: qty 100

## 2022-06-19 MED ORDER — BUDESONIDE 0.25 MG/2ML IN SUSP
0.2500 mg | Freq: Two times a day (BID) | RESPIRATORY_TRACT | Status: DC
  Administered 2022-06-19 – 2022-06-20 (×2): 0.25 mg via RESPIRATORY_TRACT
  Filled 2022-06-19 (×2): qty 2

## 2022-06-19 NOTE — Progress Notes (Signed)
Occupational Therapy Treatment Patient Details Name: Christina Porter R. Christina Porter MRN: [id number] DOB: 05/19/1961 Today's Date: 06/19/2022   History of present illness Pt is a 61 y/o F admitted on 06/03/22 after presenting to the ED with c/o SOB & confusion. 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 Pt developed acute respiratory distress 2/2 hemoptysis requiring emergent tracheostomy for airway protection, carotid artery emobilization & carotid stent placement. PMH: squamous cell carcinoma of soft palate on L s/p chemoradiation (May 2022)   OT comments  [name] continues to present with severe pain, generalized weakness, limited endurance. Assisted with bed mobility, toileting, repositioning for comfort, w/ pt requiring SUPV/CGA. Requires Mod A for grooming/cleaning up secretions from trach. Engaged in lengthy discussion with pt and pt's son re: GOC, DC options, HHOT vs. home w/ hospice vs. [place]. Provided educ to pt's son and daughter re: home care needs. Pt now stating she would like home with hospice, but is still reluctant to change code status to DNR. Pt endorses 9/10 pain. RN notified. After mobilization in room, pt reports difficulty breathing and only being able to take shallow breaths. RN and RT notified, with RT arriving to offer breathing treatment.    Recommendations for follow up therapy are one component of a multi-disciplinary discharge planning process, led by the attending physician.  Recommendations may be updated based on patient status, additional functional criteria and insurance authorization.    Follow Up Recommendations  Other (comment) (home with hospice care)    Assistance Recommended at Discharge Frequent or constant  Supervision/Assistance  Patient can return home with the following  A little help with walking and/or transfers;A lot of help with bathing/dressing/bathroom;Assistance with cooking/housework;Help with stairs or ramp for entrance;Assist for transportation;Other (comment);Assistance with feeding (asst with PEG feeding; asst with trach care)   Equipment Recommendations  BSC/3in1;Hospital bed    Recommendations for Other Services      Precautions / Restrictions Precautions Precautions: Fall Precaution Comments: trach, PEG Restrictions Weight Bearing Restrictions: No       Mobility Bed Mobility Overal bed mobility: Needs Assistance Bed Mobility: Supine to Sit, Sit to Supine     Supine to sit: Supervision Sit to supine: Supervision   General bed mobility comments: no physical assistance required. cues for technique    Transfers Overall transfer level: Needs assistance Equipment used: None Transfers: Sit to/from Stand, Bed to chair/wheelchair/BSC Sit to Stand: Min guard Stand pivot transfers: Min guard               Balance Overall balance assessment: Needs assistance Sitting-balance support: Feet supported Sitting balance-Leahy Scale: Good     Standing balance support: No upper extremity supported Standing balance-Leahy Scale: Fair                             ADL either performed or assessed with clinical judgement   ADL Overall ADL's : Needs assistance/impaired   Eating/Feeding Details (indicate cue type and reason): Pt is NPO; Total A for feeding through PEG Grooming: Wash/dry face;Bed level;Oral care;Moderate assistance Grooming Details (indicate cue type and reason): Mod A for cleaning up secretions from trach  Toilet Transfer: Stand-pivot;BSC/3in1;Supervision/safety   Writer and Hygiene: Supervision/safety;Sitting/lateral lean              Extremity/Trunk Assessment Upper Extremity  Assessment Upper Extremity Assessment: Generalized weakness   Lower Extremity Assessment Lower Extremity Assessment: Generalized weakness        Vision       Perception     Praxis      Cognition Arousal/Alertness: Awake/alert Behavior During Therapy: WFL for tasks assessed/performed Overall Cognitive Status: Within Functional Limits for tasks assessed                                          Exercises Other Exercises Other Exercises: extensive educ/discussion with pt and son re: palliative care, hospice    Shoulder Instructions       General Comments      Pertinent Vitals/ Pain       Pain Assessment Pain Score: 9  Pain Location: L side of face Pain Descriptors / Indicators: Grimacing, Guarding Pain Intervention(s): Repositioned, Patient requesting pain meds-RN notified, RN gave pain meds during session, Utilized relaxation techniques  Home Living                                          Prior Functioning/Environment              Frequency  Min 2X/week        Progress Toward Goals  OT Goals(current goals can now be found in the care plan section)  Progress towards OT goals: Not progressing toward goals - comment (goals changed to comfort, focus on QOL)  Acute Rehab OT Goals OT Goal Formulation: With patient Time For Goal Achievement: 06/26/22 Potential to Achieve Goals: Good  Plan Discharge plan remains appropriate;Frequency remains appropriate    Co-evaluation                 AM-PAC OT "6 Clicks" Daily Activity     Outcome Measure   Help from another person eating meals?: Total Help from another person taking care of personal grooming?: A Lot Help from another person toileting, which includes using toliet, bedpan, or urinal?: A Lot Help from another person bathing (including washing, rinsing, drying)?: A Lot Help from another person to put on and taking off regular upper body clothing?: A  Little Help from another person to put on and taking off regular lower body clothing?: A Lot 6 Click Score: 12    End of Session Equipment Utilized During Treatment: Oxygen  OT Visit Diagnosis: Unsteadiness on feet (R26.81);Muscle weakness (generalized) (M62.81);Pain   Activity Tolerance Patient tolerated treatment well   Patient Left in bed;with call bell/phone within reach;Other (comment) (RT in room)   Nurse Communication Mobility status;Patient requests pain meds;Other (comment) (pt experiencing difficulty breathing; notified RN and RT)        Time: 1355-1452 OT Time Calculation (min): 57 min  Charges: OT General Charges $OT Visit: 1 Visit OT Treatments $Self Care/Home Management : 53-67 mins Josiah Lobo, PhD, MS, OTR/L 06/19/22, 3:47 PM

## 2022-06-19 NOTE — Progress Notes (Addendum)
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  She presented in the ED in [place] where she presented with chest congestion, shortness of breath and hemoptysis.  CTA chest was negative for PE and admission was advised but patient left AMA. She presented in the [facility] ED 05/16/2022 with c/o: disorientation, hypertension. S/P tracheostomy placement. Prolonged hospitalization due to ongoing bleeding secondary to vessel erosion from her head/neck cancer.   Multiple conversations with medical team and nursing prior to my visit. Extensive chart review noting previous palliative visits, oncology visits, and current hospitalization. I met with "Christina Porter" and son Christina Porter as well as Therapist, sports and OT at bedside. Christina Porter and Christina Porter have recently had a conversation with her [facility] oncologist who was able to confirm that there is no further treatment options available that will be beneficial for Christina Porter at this stage of her illness. Christina Porter reviews what a journey this and life in general has been for his mother. She has had many obstacles and challenges throughout her life but has also overcome much over the years. She has 6 children but Christina Porter is her "sidekick" and HCPOA. Throughout our conversation it is clear how much they care for each other and how much Christina Porter wants what is best for him mother but also to respect her wishes. With the knowledge they have gained today they tell me that they want to go home and would like the support from hospice. We discussed hospice at home vs hospice facility and Christina Porter very much wants to go to her home but would be open to  hospice facility in the future if home becomes too difficult. We discussed the support and benefits of hospice to assist in care, guidance, and symptom management. They are aware that hospice support comes and goes and that majority of care is from family at the home but they would greatly benefit from very frequent nursing visits. She is currently having unmanaged pain so we will adjust medication to provide her improved relief and I explained that this will be a process to provide her relief that hospice can continue to adjust at home. I further reiterated that pain is something that we should be able to better manage - no go away - but to get her improved relief to enjoy the time she has left.   I discussed further with them today resuscitation. Christina Porter initially shares that she wants to be resuscitated. I spent time explaining further the limited benefits of resuscitation as this is not expected to help her but rather cause her pain and suffering during the end of her life. This will also take valuable time and moments that she could spend with family and rather be spending this time with medical personal. I explained that resuscitation has limited success in certain situations where there are options for further interventions to improve status and fix but is not successful for patients with terminal illnesses such as cancer. I acknowledged that this is all very overwhelming for Christina Porter and a lot of information today. I encouraged that we will respect her wishes but also want to make sure that we all understand the outcomes and expectations of the decisions  made. Christina Porter would like some time to think over this tonight. I will follow up with Christina Porter and Christina Porter again tomorrow. Christina Porter is leaving now to get his sister for trach teaching - they need to proceed with teaching if they wish to go home tomorrow.   All questions/concerns addressed. Emotional support provided. Updated medical team. Discussed with hospice liaison given  complicated symptoms and goals. Discussed with palliative attending symptom management options for bleeding.   Exam: Cache[place]. Alert, oriented. Non-verbal. +Trach cuffed with bloody drainage and coughing up clots at times. Breathing regular, unlabored. Abd flat. Moves all extremities - able to get to [facility] with standby assist.   Plan: - Ongoing discussion regarding DNR - she is considering and will like to think on this overnight.  - Home with hospice 8/22 if equipment able to arrive in home by then.  - Pain: Morphine increased to 15 mg every 4 hours as needed.  - Bleeding from vessel erosion from soft palate mass/cancer: Budesonide nebulizer BID. May consider continuous epinephrine nebulizer if worsens.   [place], NP Palliative Medicine Team Pager [phone] (Please see [email or web address] for schedule) Team Phone [phone]    Greater than 50%  of this time was spent counseling and coordinating care related to the above assessment and plan

## 2022-06-19 NOTE — Assessment & Plan Note (Addendum)
Patient developed significant hemoptysis overnight.  Continue to get blood clots on trach sectioning. She was also coughing and spitting blood. Hemoglobin dropped to 6.6. -Ordered 2 unit of PRBC -Continue to monitor Patient and family decided to go home with hospice.

## 2022-06-19 NOTE — Progress Notes (Signed)
[facility]) Hospital Liaison Note   Received request from Transitions of Care Manager, Colletta [place], for hospice services at home after discharge. Chart and patient information under review by [facility] physician. Hospice eligibility approved.   Spoke with son/Ryan to initiate education related to hospice philosophy, services, and team approach to care. Ryan verbalized understanding of information given. Per discussion, the plan is for patient to discharge home via [facility] once cleared to DC.    DME needs discussed. Patient has the following equipment in the home (Ada[employer]): Trach Feeding tube Shower chair 02 compressor Patient requests the following equipment for delivery: BSC O2: 5L Suction   Address verified and is correct in the chart. [name] is the family member to contact to arrange time of equipment delivery.    Please send signed and completed DNR home with patient/family. Please provide prescriptions at discharge as needed to ensure ongoing symptom management.    [facility] information and contact numbers given to family & above information shared with TOC.   Please call with any questions/concerns.    Thank you for the opportunity to participate in this patient's care.   Daphene Calamity, MSW [facility] Liaison  [phone]

## 2022-06-19 NOTE — Assessment & Plan Note (Signed)
Continue current pain medicine regimen Palliative also ordered some extra morphine as she continues to have pain.

## 2022-06-19 NOTE — Assessment & Plan Note (Signed)
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  PET scan 12/15/2020 also notable for local metastatic disease - follows with Dr. Harriette Ohara at [facility] - s/p chemo (cisplatin) -Recently contacted by radiation oncology (Dr. Harriette Ohara) 05/12/2022.  They are trying to reschedule study injection and simulation appointments; she has missed prior appointments.  Currently bleeding tumor due to blood vessel erosions.  She did received 1 extensive embolization with vascular surgery earlier.  Overall poor prognosis.

## 2022-06-19 NOTE — Plan of Care (Signed)
Pt AAOx4, severe pain in L neck/jaw/ear. Pt is tachycardic, but other VS are stable. Rapid called this afternoon after pt coughed up several large blood clots and stated she couldn't breathe. Pt was stablized, suctioned, tracheostomy cannula changed and given pain medication. After son spoke with MD and palliative, they have decided to go home with hospice. Plan to tranfuse and pain control with planned discharge home tomorrow with hospice. Team is aware. Bed in lowest position, call light within reach. Will continue to monitor.

## 2022-06-19 NOTE — Progress Notes (Signed)
Mobility Specialist - Progress Note   06/19/22 1100  Mobility  Activity Transferred to/from [facility];Ambulated with assistance in room  Level of Assistance Standby assist, set-up cues, supervision of patient - no hands on  Assistive Device Front wheel walker;BSC  Distance Ambulated (ft) 5 ft  Activity Response Tolerated well  $Mobility charge 1 Mobility     Pre-mobility: 100 HR, 98% SpO2 During mobility: 102 HR, 98% SpO2   Pt lying in bed upon arrival, utilizing 5L via trach. Pt awakened to voice and agreeable to activity. O2 set on 4L for OOB. Pt sat EOB modI. Dizzy upon sitting/standing with pt beginning to continuously sway laterally and posteriorly once upright. More unsteady this date than previous session. Further ambulation deferred. Pt transferred to [facility] for urinal output before returning to bed. RN notified. Pt left in bed with alarm set, needs in reach.    Kathee Delton Mobility Specialist 06/19/22, 11:29 AM

## 2022-06-19 NOTE — Progress Notes (Signed)
PT Cancellation Note  Patient Details Name: Christina Porter MRN: [id number] DOB: 10-07-1961   Cancelled Treatment:    Reason Eval/Treat Not Completed: Medical issues which prohibited therapy (Patient with recent rapid response; will hold PT session at this time.  Will continue efforts next date as medically appropriate.)  Keiley Levey H. Owens Shark, PT, DPT, NCS 06/19/22, 10:21 PM [phone]

## 2022-06-19 NOTE — Assessment & Plan Note (Signed)
Patient developed significant hemoptysis overnight.  Continue to get blood clots on trach sectioning.  Received multiple nebulizations of transexamic acid. She was also coughing and spitting blood. Hemoglobin dropped to 6.6. -Ordered 2 unit of PRBC -Continue to monitor Patient and family decided to go home with hospice.

## 2022-06-19 NOTE — Progress Notes (Signed)
Pt stated she could not breath when I attempted to cap her trach and give her SVN via aerosol face mask.SVN given via aerosol face mask without capping trach.

## 2022-06-19 NOTE — Assessment & Plan Note (Signed)
Continue heparin flushes Was hospitalized from 7/17 to 7/21 with concern for septic thrombophlebitis.  Blood cultures were negative.  Was treated with Unasyn and then Augmentin to complete a 2-week course ID following.

## 2022-06-19 NOTE — Progress Notes (Addendum)
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 Augmentin with plan for follow-up at [facility], with radiation starting on 05/30/2022.  She was seen in [facility] ED on 06/02/2022 for hypoxia and shortness of breath due to concern for aspiration pneumonia, unfortunately she left AMA.  She later presented to [facility] ED on the same day, but also left AMA. Presented again to [facility] ED 06/03/2022 w/ tachycardia/tachypnea.   Patient was admitted for sepsis secondary to possible UTI vs neck mass, started on IV fluids and IV antibiotics. 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   8/6: Admitted by Hospitalist 8/7: ENT & ID consulted. 8/8: Oncology consulted. Concern for aspiration 8/9: Voice becoming hoarse. Palliative Care consulted 8/10: Some SOB noted. Vascular Surgery consulted. 8/11: Rapid response called due to recurrent bleeding with development of acute respiratory distress.  ENT performed emergent Tracheostomy, Vascular Surgery performed coil embolization of first 3 external carotid artery branches and placed Left Carotid Stent.  PCCM consulted for vent management 8/12 attempted SBT, failed initial attempt secondary to tachypnea and hypoxia.  Later able to tolerate TC  8/13: Received 1 unit pRBC's overnight. Tolerating TC trials 8/15:  transfer back to hospitalist service. D/w [name] see below. Spoke w/ son, see separate progress note, plan for meeting tomorrow. See progress note from 8/15 re d/w oncology team and ENT at [facility].  8/16: ambulating, urinating, tolerating trach w/ O2 support. Son has not shown up as of 3 pm. Pt appears to be progressing, will consult TOC for DME/HH if we can arrange this and no further bleeding may be appropriate to go home if patient ans her son are amenable  8/17-8/19: Family education for DME and trach care provided.  Waiting on all DME to be set up before discharge  [place]: Per palliative care note 08/14 and c/w multiple previous discussions w/ palliative care: oldest son is her preferred decision-maker. "She indicates  that she will undergo whatever is needed to try to add every single extra day she can get on this earth... Full code and full scope. Patient would like any and all care indicated for as long as possible regardless of pain or suffering, without limit."  Since patient follows up with hem-onc at [facility] (family is hopeful for treatment there to be helpful), her oncologist Dr Harriette Ohara at [facility] 06/06/2022 reported they would accept patient if/when bed available. Multiple attempts have been made to transfer the patient to [facility] but no bed available.    8/20: Patient is stable for discharge with trach collar and PEG tube.  Prognosis remains very guarded and poor. Daughter has to learn trach care which will be done tomorrow. Need to have a close follow-up with her [facility] providers. Also waiting for equipment to be delivered before discharge. 8/21: Overnight patient was coughing up some blood, repeat hemoglobin at 7.7 from seven-point 3 in the morning.  She was giving transischemic acid nebulizer with no more hemoptysis. Daughter to complete trach training today before discharge. Magnesium at 1.5 which is being repleted. 1: 00 PM: Rapid response was called due to persistent bleeding and multiple clots were removed  with suctioning of her trach.  Patient keeps saying that she cannot breathe although I tells stable with sinus tachycardia.  Talked with son on phone and he wants her to go on ventilator if needed.  Talked with her radiation oncologist Dr. Crisoforo Oxford at [facility] and according to her their assessment is based on few weeks prior when she was not that sick.  Patient apparently missed multiple appointments for different reasons.  She was enrolled on a trial but has not started yet.  Patient is being transferred to ICU as she might require ventilator support.  Palliative care again met with patient and family.  She would like to remain full code but agrees to go home with hospice. Most likely can go home with hospice tomorrow.  They would like to continue with tube feed.  Patient is extremely high risk for deterioration and death.  Still wishes to remain full code with full scope of care.   Assessment and Plan: * Acute respiratory failure with hypoxia ([place]) Now resolved.  S/p tracheostomy Currently saturating well with trach collar. She was on 5 L. -Nursing staff to determine the oxygen need. -Most likely be going home with hospice  Aspiration pneumonia ([place]) Continue IV Unasyn and switch to Augmentin at discharge. 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  PET scan 12/15/2020 also notable for local metastatic disease - follows with Dr. Harriette Ohara at [facility] - s/p chemo (cisplatin) -Recently contacted by radiation oncology (Dr. Harriette Ohara) 05/12/2022.  They are trying to reschedule study injection and simulation appointments; she has missed prior appointments.  Currently bleeding tumor due to blood vessel erosions.  She did received 1 extensive embolization with vascular surgery earlier.  Overall poor prognosis.  SIRS (systemic inflammatory response syndrome)  (HCC) Patient met sepsis criteria so sepsis ruled in with tachycardia, tachypnea and aspiration pneumonia. -Treatment as above   Internal jugular vein thrombosis, left (HCC) Continue heparin flushes Was hospitalized from 7/17 to 7/21 with concern for septic thrombophlebitis.  Blood cultures were negative.  Was treated with Unasyn and then Augmentin to complete a 2-week course ID following.  Possible Seizure disorder (HCC) On gabapentin 600 mg 3 times daily   Bipolar 1 disorder ([place]) - Continue home Seroquel and trazodone  Hemoptysis Patient developed significant hemoptysis overnight.  Continue to get blood clots on trach sectioning.  Received multiple nebulizations of transexamic acid. She was also coughing and spitting blood. Hemoglobin dropped to 6.6. -Ordered 2 unit of PRBC -Continue to monitor Patient and family decided to go home with hospice.  Cancer associated pain Continue current pain medicine regimen Palliative also ordered some extra morphine as she continues to have pain.   Abnormal urinalysis Patient was given a dose of Rocephin in the ED based on abnormal urinalysis done at [facility] on 8/4 We will continue ceftriaxone given concerns for SIRS and possible sepsis Follow blood and urine cultures  Hyponatremia Improved with hydration.  Malnourished ([place]) Estimated body mass index is 16.25 kg/m as calculated from the following:   Height as of this encounter: '5\' 2"'  (1.575 m).   Weight as of this encounter: 40.3 kg.  Complicates overall prognosis.   Dietitian consult Patient currently is being fed with PEG tube  Tobacco abuse Continue nicotine patch.  Underweight-resolved as of 06/15/2022 BMI 16.94.  Suspect severe protein calorie malnutrition Nutritionist eval        Subjective: Patient continued to have significant hemoptysis and keeps saying that I cannot breathe.  When I ask about comfort, she stated that she wants to fight.  Physical  Exam: Vitals:   06/19/22 0512 06/19/22 0515 06/19/22 0800 06/19/22 1145  BP: (!) 99/56  97/66 110/64  Pulse: 95  (!) 106 (!) 117  Resp: 16  14   Temp: 99.4 F (37.4 C)  98.4 F (36.9 C)   TempSrc: Oral  Oral   SpO2: 99% 97% 100% 98%  Weight:      Height:       General.  Anxious looking, ill-appearing lady, appears in distress.  Trach collar in place Pulmonary.  Harsh breath sounds, mildly increased work of breathing. CV.  Regular rate and rhythm, no JVD, rub or murmur. Abdomen.  Soft, nontender, nondistended, BS positive.  PEG tube in place CNS.  Alert and oriented .  No focal neurologic deficit. Extremities.  No edema, no cyanosis, pulses intact and symmetrical. Psychiatry.  Judgment and insight appears normal.  Data Reviewed: Prior data reviewed  Family Communication: Discussed with son  Disposition: Status is: Inpatient Remains inpatient appropriate because: Severity of illness   Planned Discharge Destination:  Home with hospice tomorrow  Time spent: 55 minutes  This record has been created using Systems analyst. Errors have been sought and corrected,but may not always be located. Such creation errors do not reflect on the standard of care.  Author: Lorella Nimrod, MD 06/19/2022 3:05 PM  For on call review [email or web address].

## 2022-06-19 NOTE — Progress Notes (Signed)
Patient started coughing up blood clots tonight. Several clots spit up. Notified Neomia Glass, NP. Orders placed for Tranexamic Acid. Patient stated this is helping.  Patient in severe pain tonight orders placed for her to have PRN morphine as well.  Chest port was re-accessed tonight by IV team.  No complaints at this time. Will continue to monitor

## 2022-06-19 NOTE — Assessment & Plan Note (Signed)
Now resolved.  S/p tracheostomy Currently saturating well with trach collar. She was on 5 L. -Nursing staff to determine the oxygen need. -Most likely be going home with hospice

## 2022-06-19 NOTE — Significant Event (Signed)
Rapid Response Event Note   Reason for Call :  Shortness of breath, coughing up large blood clots  Initial Focused Assessment:  Rapid response RN arrived in patient's room with patient sitting up in bed coughing. Blood was down the front of her gown and onto the blanket in her lap. Blood was oozing out of trach and patient's nose. Nurses at bedside had a very large blood clot on a washcloth that came out of patient's trach per report. Per nurses at bedside, as coughing event started patient reported she couldn't breathe. Patient was alert and finished coughing. Reported that her shortness of breath after the coughing was the same as it was before the coughing. Vital signs showed oxygen levels of 100% (essentially on room air as trach collar was to the side due to the productive cough), heart rate was in 120s, BP 110/64. Respiratory rate elevated with RR in low 20s but work of breathing easing.  Interventions:  RT changed patient's inner cannula and repositioned the trach collar back over the trach. Patient's nurse drew H&H per Dr. Latina Craver orders from patient's port. Nurses at bedside helped clean up patient and change patient's linen.   Plan of Care:  Dr. Reesa Chew to speak with family and will review patient's orders and make changes as appropriate.  Event Summary:   MD Notified: Dr. Reesa Chew Call Time: 11:38 Arrival Time: 11:39 End Time: 11:55  Akili Cuda, Jaynie Bream, RN

## 2022-06-19 NOTE — TOC Progression Note (Signed)
Transition of Care (TOC) - Progression Note    Patient Details  Name: [name] MRN: [id number] Date of Birth: August 29, 1961  Transition of Care [facility]) CM/SW Contact  Beverly Sessions, RN Phone Number: 06/19/2022, 12:05 PM  Clinical Narrative:     Portable suction and BSC at bedside from adapt for discharge.  RW will also need to be delivered  Awaiting qualifying sats to determine if patient needs a home O2 concentrator for O2, or if she doesn't qualify a compressor for humidification  Daughter and son plan to come at 2 pm for additional trach teaching  Patient will need to be sent home with 3 days of feeds, and 3 days of trach supplies   For EMS transport need to confirm if the truck has suction on board.  If not the portable suction will need to be sent with patient. If EMS has suction available on truck son will need to take home portable suction, BSC and RW  TOC to follow up with [facility] [employer] [place] to determine if hours have been approved and if orders have been typed for MD to sign  Update: Patient currently in rapid response.  Per MD potential transfer to ICU.  Zack with adapt updated   Expected Discharge Plan: Home/Self Care Barriers to Discharge: Continued Medical Work up  Expected Discharge Plan and Services Expected Discharge Plan: Home/Self Care       Living arrangements for the past 2 months: Apartment                                       Social Determinants of Health (SDOH) Interventions    Readmission Risk Interventions     No data to display

## 2022-06-20 DIAGNOSIS — Z7189 Other specified counseling: Secondary | ICD-10-CM | POA: Diagnosis not present

## 2022-06-20 DIAGNOSIS — J9601 Acute respiratory failure with hypoxia: Secondary | ICD-10-CM | POA: Diagnosis not present

## 2022-06-20 DIAGNOSIS — Z515 Encounter for palliative care: Secondary | ICD-10-CM | POA: Diagnosis not present

## 2022-06-20 DIAGNOSIS — C760 Malignant neoplasm of head, face and neck: Secondary | ICD-10-CM | POA: Diagnosis not present

## 2022-06-20 LAB — GLUCOSE, CAPILLARY
Glucose-Capillary: 132 mg/dL — ABNORMAL HIGH (ref 70–99)
Glucose-Capillary: 142 mg/dL — ABNORMAL HIGH (ref 70–99)
Glucose-Capillary: 210 mg/dL — ABNORMAL HIGH (ref 70–99)
Glucose-Capillary: 210 mg/dL — ABNORMAL HIGH (ref 70–99)

## 2022-06-20 LAB — MAGNESIUM: Magnesium: 2 mg/dL (ref 1.7–2.4)

## 2022-06-20 LAB — PREPARE RBC (CROSSMATCH)

## 2022-06-20 LAB — PHOSPHORUS: Phosphorus: 3.6 mg/dL (ref 2.5–4.6)

## 2022-06-20 MED ORDER — MORPHINE SULFATE (CONCENTRATE) 10 MG/0.5ML PO SOLN
20.0000 mg | ORAL | Status: DC | PRN
  Administered 2022-06-20 (×2): 20 mg
  Filled 2022-06-20 (×2): qty 1

## 2022-06-20 MED ORDER — MORPHINE SULFATE (CONCENTRATE) 10 MG/0.5ML PO SOLN
20.0000 mg | ORAL | 0 refills | Status: AC | PRN
Start: 2022-06-20 — End: ?

## 2022-06-20 MED ORDER — MORPHINE SULFATE (CONCENTRATE) 10 MG/0.5ML PO SOLN
15.0000 mg | ORAL | 0 refills | Status: DC | PRN
Start: 2022-06-20 — End: 2022-06-20

## 2022-06-20 MED ORDER — AMOXICILLIN-POT CLAVULANATE 250-62.5 MG/5ML PO SUSR: 250.0000 mg | Freq: Two times a day (BID) | ORAL | 0 refills | Status: AC

## 2022-06-20 MED ORDER — OSMOLITE 1.2 CAL PO LIQD: 237.0000 mL | Freq: Every day | ORAL | 3 refills | Status: AC

## 2022-06-20 MED ORDER — IPRATROPIUM-ALBUTEROL 0.5-2.5 (3) MG/3ML IN SOLN: 3.0000 mL | RESPIRATORY_TRACT | 0 refills | Status: AC | PRN

## 2022-06-20 MED ORDER — PANTOPRAZOLE SODIUM 40 MG PO PACK
40.0000 mg | PACK | Freq: Every day | ORAL | 1 refills | Status: AC
Start: 2022-06-20 — End: ?

## 2022-06-20 MED ORDER — HYOSCYAMINE SULFATE 0.125 MG SL SUBL: 0.1250 mg | SUBLINGUAL_TABLET | SUBLINGUAL | 0 refills | Status: AC | PRN

## 2022-06-20 MED ORDER — FREE WATER: 100.0000 mL | Freq: Every day | 11 refills | Status: AC

## 2022-06-20 MED ORDER — JUVEN PO PACK: 1.0000 | PACK | Freq: Two times a day (BID) | ORAL | 2 refills | Status: AC

## 2022-06-20 MED ORDER — PROCHLORPERAZINE 25 MG RE SUPP: 25.0000 mg | RECTAL | 0 refills | Status: AC | PRN

## 2022-06-20 MED ORDER — POLYETHYLENE GLYCOL 3350 17 G PO PACK: 17.0000 g | PACK | Freq: Every day | ORAL | 0 refills | Status: AC

## 2022-06-20 NOTE — Progress Notes (Signed)
[facility])    Patient will NOT need a DNR as patient is a full code.   Please provide prescriptions at discharge as needed to ensure ongoing symptom management and a transport packet.  Transport has been arranged for 7 pm by MSW. [facility] staff aware that suction will need to be available on [facility] truck for transport. Family aware of transport schedule as DME set to arrive @ 4 pm.   [facility] information and contact numbers given to family and above information shared with TOC.    Please call with any questions/concerns.    Thank you for the opportunity to participate in this patient's care   Daphene Calamity, MSW [facility] Liaison  [phone]

## 2022-06-20 NOTE — Progress Notes (Incomplete)
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  She presented in the ED in [place] where she presented with chest congestion, shortness of breath and hemoptysis.  CTA chest was negative for PE and admission was advised but patient left AMA. She presented in the [facility] ED 05/16/2022 with c/o: disorientation, hypertension. S/P tracheostomy placement. Prolonged hospitalization due to ongoing bleeding secondary to vessel erosion from her head/neck cancer.   I met today at with Christina Porter - family not at bedside. She asks questions about going home and the timing. I explain that timing depends on timing of equipment delivery and then transport. She talks of going home and the ambiguity of what this may look like. I reassure her that hospice will be available for support for her and her family. She tells me about her support and reliance on [name] although her daughter is in the home and she is hoping that she will be helpful for her care. Christina Porter continues to be in good spirits. Spent some time discussing care and plan forward. Christina Porter shares some about her life with me.   I was awaiting [name] to come to bedside to further discuss code status. I called [name] and he is unable to come to bedside until 5pm and I discussed with him to pick up [facility] and suction here in her room. I have discussed with hospice liaison and encouraged that hospice follow up on code status during admission at home. These conversations are better when [name] is present for support and to hear his mother's wishes.   All questions/concerns addressed. Emotional support  provided.   Exam: Alert, oriented. Able to communicate clearly on white board writing. No distress. Copious bloody drainage from tracheostomy - this is expected and do not anticipate improvement. Abd soft. Moves all extremities.   Plan:  - Ongoing discussion regarding DNR - she is considering and will like to think on this overnight.  - Home with hospice 8/22 if equipment able to arrive in home by then.  - Pain: Morphine increased to 20 mg every 4 hours as needed.  - Bleeding from vessel erosion from soft palate mass/cancer: Budesonide nebulizer BID. May consider continuous epinephrine nebulizer if worsens.    [place], NP Palliative Medicine Team Pager [phone] (Please see [email or web address] for schedule) Team Phone [phone]    Greater than 50%  of this time was spent counseling and coordinating care related to the above assessment and plan

## 2022-06-20 NOTE — TOC Transition Note (Signed)
Transition of Care (TOC) - CM/SW Discharge Note   Patient Details  Name: Oni R. Jhanae Jaskowiak MRN: [id number] Date of Birth: 07-25-1961  Transition [facility] Care [facility]) CM/SW Contact:  Beverly Sessions, RN Phone Number: 06/20/2022, 2:24 PM   Clinical Narrative:     Patient to dc today with hospice services through [facility] with [facility] has arranged DME and EMS transport  I confirmed that EMS will have suction available during transport  EMS packet on chart  Son to pick up portable suction and BSC from the hospital     Barriers to Discharge: Continued Medical Work up   Patient Goals and CMS Choice Patient states their goals for this hospitalization and ongoing recovery are:: Home, self care CMS Medicare.gov Compare Post Acute Care list provided to:: Patient    Discharge Placement                       Discharge Plan and Services                                     Social Determinants of Health (SDOH) Interventions     Readmission Risk Interventions     No data to display

## 2022-06-20 NOTE — Plan of Care (Signed)
AVS and education provided. Port de-accessed with no complications. PEG dressing changed and trach care performed. Pt transported by EMS to home where she will be followed by Hospice.

## 2022-06-20 NOTE — Plan of Care (Signed)
Pt AAOx4, severe pain in L neck/face that comes and goes. 2 units of PRBCs given overnight, VS are stable. Plan for discharge home with hospice today. Bed in lowest position, call light within reach. Will continue to monitor.

## 2022-06-20 NOTE — Discharge Summary (Addendum)
Physician Discharge Summary   Patient: Christina Porter MRN: [id number] DOB: 10-29-1961  Admit date:     06/03/2022  Discharge date: 06/20/22  Discharge Physician: Lorella Nimrod   PCP: Neomia Dear, MD   Recommendations at discharge:  Patient is going home with home hospice with very poor prognosis.  Discharge Diagnoses: Principal Problem:   Acute respiratory failure with hypoxia (HCC) Active Problems:   Throat cancer (HCC)   Aspiration pneumonia (HCC)   SIRS (systemic inflammatory response syndrome) (HCC)   Internal jugular vein thrombosis, left (HCC)   Possible Seizure disorder (HCC)   Bipolar 1 disorder (HCC)   Tobacco abuse   Malnourished ([place])   Hyponatremia   Abnormal urinalysis   Cancer associated pain   Pressure injury of skin   Protein-calorie malnutrition, severe   Hemoptysis  Resolved Problems:   Underweight   Head and neck cancer [facility])  Hospital Course: This 61 years old female with PMH significant for 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 Augmentin with plan for follow-up at [facility], with radiation starting on 05/30/2022.  She was seen in [facility] ED on 06/02/2022 for hypoxia and shortness of breath due to concern for aspiration pneumonia, unfortunately she left AMA.  She later presented to [facility] ED on the same day, but also left AMA. Presented again to [facility] ED 06/03/2022 w/ tachycardia/tachypnea.   Patient was admitted for sepsis secondary to possible UTI vs neck mass, started on IV fluids and IV antibiotics. 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   8/6: Admitted by Hospitalist 8/7: ENT & ID consulted. 8/8: Oncology consulted. Concern for aspiration 8/9: Voice becoming hoarse. Palliative Care consulted 8/10: Some SOB noted. Vascular Surgery consulted. 8/11: Rapid response called due to recurrent bleeding with development of acute respiratory distress.  ENT performed emergent Tracheostomy, Vascular Surgery performed coil embolization of first 3 external carotid artery branches and placed Left Carotid Stent.  PCCM consulted for vent management 8/12 attempted SBT, failed initial attempt secondary to tachypnea and hypoxia.  Later able to tolerate TC  8/13: Received 1 unit pRBC's overnight. Tolerating TC trials 8/15: transfer back to hospitalist service. D/w [name] see below. Spoke w/ son, see separate progress note, plan for meeting tomorrow. See progress note from 8/15 re d/w oncology team and ENT at [facility].  8/16: ambulating, urinating, tolerating trach w/ O2 support. Son has not shown up as of 3 pm. Pt appears to be progressing, will consult TOC for DME/HH if we can arrange this and no further bleeding may be appropriate to go home if patient ans her son are amenable  8/17-8/19: Family education for DME and trach care provided.  Waiting on all DME to be set up before discharge  [place]: Per palliative care note 08/14 and c/w multiple previous discussions w/ palliative care: oldest son is her preferred decision-maker. "She indicates  that she will undergo whatever is needed to try to add every single extra day she can get on this earth... Full code and full scope. Patient would like any and all care indicated for as long as possible  regardless of pain or suffering, without limit."  Since patient follows up with hem-onc at [facility] (family is hopeful for treatment there to be helpful), her oncologist Dr Harriette Ohara at [facility] 06/06/2022 reported they would accept patient if/when bed available. Multiple attempts have been made  to transfer the patient to [facility] but no bed available.    8/20: Patient is stable for discharge with trach collar and PEG tube.  Prognosis remains very guarded and poor. Daughter has to learn trach care which will be done tomorrow. Need to have a close follow-up with her [facility] providers. Also waiting for equipment to be delivered before discharge. 8/21: Overnight patient was coughing up some blood, repeat hemoglobin at 7.7 from seven-point 3 in the morning.  She was giving transischemic acid nebulizer with no more hemoptysis. Daughter to complete trach training today before discharge. Magnesium at 1.5 which is being repleted. 1: 00 PM: Rapid response was called due to persistent bleeding and multiple clots were removed with suctioning of her trach.  Patient keeps saying that she cannot breathe although I tells stable with sinus tachycardia.  Talked with son on phone and he wants her to go on ventilator if needed.  Talked with her radiation oncologist Dr. Crisoforo Oxford at [facility] and according to her their assessment is based on few weeks prior when she was not that sick.  Patient apparently missed multiple appointments for different reasons.  She was enrolled on a trial but has not started yet.  Patient is being transferred to ICU as she might require ventilator support.  Palliative care again met with patient and family.  She would like to remain full code but agrees to go home with hospice. Most likely can go home with hospice tomorrow.  They would like to continue with tube feed.  8/22: Patient remained stable.  Received 2 unit of PRBC yesterday.  Continue to have some pinkish discharge from trach. She was asking for more pain medications which were provided as she is going home with hospice.  Patient still not DNR and will need ongoing discussion. Patient has a desire to follow-up with her [facility] providers for further recommendations.  We will leave that decision to her and her oncologist at [facility]  Patient is  extremely high risk for deterioration and death.  Still wishes to remain full code with full scope of care.  Assessment and Plan: * Acute respiratory failure with hypoxia ([place]) Now resolved.  S/p tracheostomy Currently saturating well with trach collar. She was on 5 L. -Nursing staff to determine the oxygen need. -Most likely be going home with hospice  Aspiration pneumonia ([place]) Continue IV Unasyn and switch to Augmentin at discharge. Patient will need long-term Augmentin per ID due to persistence aspiration with fungating mass  Throat cancer ([place]) soft palate SCC; left neck FNA positive for malignancy consistent with metastatic SCC.  PET scan 12/15/2020 also notable for local metastatic disease - follows with Dr. Harriette Ohara at [facility] - s/p chemo (cisplatin) -Recently contacted by radiation oncology (Dr. Harriette Ohara) 05/12/2022.  They are trying to reschedule study injection and simulation appointments; she has missed prior appointments.  Currently bleeding tumor due to blood vessel erosions.  She did received 1 extensive embolization with vascular surgery earlier.  Overall poor prognosis.  SIRS (systemic inflammatory response syndrome) (HCC) Patient met sepsis criteria so sepsis ruled in with tachycardia, tachypnea and aspiration pneumonia. -Treatment as above   Internal jugular vein thrombosis, left (HCC) Continue heparin flushes Was hospitalized from 7/17 to 7/21 with  concern for septic thrombophlebitis.  Blood cultures were negative.  Was treated with Unasyn and then Augmentin to complete a 2-week course ID following.  Possible Seizure disorder (HCC) On gabapentin 600 mg 3 times daily   Bipolar 1 disorder ([place]) - Continue home Seroquel and trazodone  Hemoptysis Patient developed significant hemoptysis overnight.  Continue to get blood clots on trach sectioning.  Received multiple nebulizations of transexamic acid. She was also coughing and spitting blood. Hemoglobin dropped to  6.6. -Ordered 2 unit of PRBC -Continue to monitor Patient and family decided to go home with hospice.  Cancer associated pain Continue current pain medicine regimen Palliative also ordered some extra morphine as she continues to have pain.   Abnormal urinalysis Patient was given a dose of Rocephin in the ED based on abnormal urinalysis done at [facility] on 8/4 We will continue ceftriaxone given concerns for SIRS and possible sepsis Follow blood and urine cultures  Hyponatremia Improved with hydration.  Malnourished ([place]) Estimated body mass index is 16.25 kg/m as calculated from the following:   Height as of this encounter: _0  (1.575 m).   Weight as of this encounter: 40.3 kg.   Complicates overall prognosis.   Dietitian consult Patient currently is being fed with PEG tube  Tobacco abuse Continue nicotine patch.  Underweight-resolved as of 06/15/2022 BMI 16.94.  Suspect severe protein calorie malnutrition Nutritionist eval        Pain control - [place] Controlled Substance Reporting System database was reviewed. and patient was instructed, not to drive, operate heavy machinery, perform activities at heights, swimming or participation in water activities or provide baby-sitting services while on Pain, Sleep and Anxiety Medications; until their outpatient Physician has advised to do so again. Also recommended to not to take more than prescribed Pain, Sleep and Anxiety Medications.  Consultants: PCCM, palliative, vascular surgery Procedures performed: Tracheostomy, PEG tube placement, carotid artery embolization Disposition: Hospice care Diet recommendation:  Discharge Diet Orders (From admission, onward)     Start     Ordered   06/20/22 0000  Diet - low sodium heart healthy        06/20/22 1103           NPO tube feed DISCHARGE MEDICATION: Allergies as of 06/20/2022       Reactions   Benadryl [diphenhydramine] Other (See Comments)   Somnolence when  given 25 mg IV; consider low dose administration        Medication List     STOP taking these medications    morphine 15 MG 12 hr tablet Commonly known as: MS CONTIN Replaced by: morphine CONCENTRATE 10 MG/0.5ML Soln concentrated solution   oxyCODONE 5 MG immediate release tablet Commonly known as: Roxicodone   oxyCODONE 5 MG/5ML solution Commonly known as: ROXICODONE       TAKE these medications    acetaminophen 325 MG tablet Commonly known as: TYLENOL Take 2 tablets (650 mg total) by mouth every 4 (four) hours as needed for mild pain (or Fever >/= 101).   amoxicillin-clavulanate 250-62.5 MG/5ML suspension Commonly known as: AUGMENTIN Take 5 mLs (250 mg total) by mouth 2 (two) times daily for 14 days.   free water Soln Place 100 mLs into feeding tube 6 (six) times daily. Notes to patient: Flush free water after feeding   gabapentin 250 MG/5ML solution Commonly known as: NEURONTIN Take 12 mLs by mouth in the morning, at noon, and at bedtime. What changed: Another medication with the same name was removed. Continue  taking this medication, and follow the directions you see here.   hyoscyamine 0.125 MG SL tablet Commonly known as: LEVSIN SL Place 1 tablet (0.125 mg total) under the tongue every 4 (four) hours as needed (excess oral secretions).   ipratropium-albuterol 0.5-2.5 (3) MG/3ML Soln Commonly known as: DUONEB Take 3 mLs by nebulization every 4 (four) hours as needed (shortness of breath.).   morphine CONCENTRATE 10 MG/0.5ML Soln concentrated solution Place 1 mL (20 mg total) into feeding tube every 4 (four) hours as needed for severe pain. Replaces: morphine 15 MG 12 hr tablet   naloxone 4 MG/0.1ML Liqd nasal spray kit Commonly known as: NARCAN SMARTSIG:1 Both Nares Daily   nutrition supplement (JUVEN) Pack Place 1 packet into feeding tube 2 (two) times daily between meals.   feeding supplement (OSMOLITE 1.2 CAL) Liqd Place 237 mLs into feeding tube  6 (six) times daily.   pantoprazole sodium 40 mg Commonly known as: PROTONIX Place 40 mg into feeding tube daily.   polyethylene glycol 17 g packet Commonly known as: MIRALAX / GLYCOLAX Place 17 g into feeding tube daily. Start taking on: June 21, 2022 What changed: See the new instructions. Notes to patient: HOLD FOR LOOSE STOOLS   prochlorperazine 25 MG suppository Commonly known as: COMPAZINE Place 1 suppository (25 mg total) rectally every 4 (four) hours as needed for nausea or vomiting.   QUEtiapine 100 MG tablet Commonly known as: SEROQUEL Take 100 mg by mouth at bedtime.   traZODone 100 MG tablet Commonly known as: DESYREL Take 100 mg by mouth at bedtime.               Durable Medical Equipment  (From admission, onward)           Start     Ordered   06/18/22 1224  For home use only DME Walker rolling  Once       Question Answer Comment  Walker: With [place] Wheels   Patient needs a walker to treat with the following condition Weakness      06/18/22 1223   06/15/22 1150  For home use only DME Trach supplies  (For Home Use Only DME Trach Supplies)  Once       Question Answer Comment  Trach Type Shiley   Back Up Trach Type Shiley   Cuffed or Uncuffed Cuffed   Fenestrated No   Size 6.0   Trach Supplies/Equipment for Home Use Tube Holder/Collar   Trach Supplies/Equipment for Home Use Tracheostomy Care Cleaning Kits   Trach Supplies/Equipment for Home Use Humidification (oxygen 5 liters via trach collar to provide specified % - if tracheostomy is capped with occlusive cap during day, a separate [place] order will need to be provided)   Lurline Idol Supplies/Equipment for Home Use Tracheostomy Drain Dressings   Trach Supplies/Equipment for Home Use Medical Suction Machine   Trach Supplies/Equipment for Home Use Suction Catheters   Suction Catheter Size 14 French (for size 6 or higher)   Cleaning Kits 60      06/15/22 1153   06/15/22 1123  For home use only DME  Bedside commode  Once       Question:  Patient needs a bedside commode to treat with the following condition  Answer:  Weakness   06/15/22 1122              Discharge Care Instructions  (From admission, onward)           Start     Ordered  06/20/22 0000  Discharge wound care:       Comments: Gently cleanse tumor on left neck with saline, pat dry. Place a size appropriate piece of Aquacel Advantage Kellie Simmering [phone]) over the area and secure with a foam dressing or dry gauze and tape. Change daily and prn soilage.   06/20/22 1103            Discharge Exam: Filed Weights   06/17/22 0357 06/18/22 0500 06/19/22 0500  Weight: 38.5 kg 40.3 kg 38 kg   General.  Ill-appearing, severely malnourished lady, in no acute distress.  Large left-sided mass involving left jaw and neck, tracheostomy in place Pulmonary.  Harsh breath sounds bilaterally, normal respiratory effort. CV.  Regular rate and rhythm, no JVD, rub or murmur. Abdomen.  Soft, nontender, nondistended, BS positive.  PEG tube in place CNS.  Alert and oriented .  No focal neurologic deficit. Extremities.  No edema, no cyanosis, pulses intact and symmetrical. Psychiatry.  Judgment and insight appears normal.   Condition at discharge: poor  The results of significant diagnostics from this hospitalization (including imaging, microbiology, ancillary and laboratory) are listed below for reference.   Imaging Studies: DG Chest Port 1 View  Result Date: 06/12/2022 CLINICAL DATA:  Pleural effusion EXAM: PORTABLE CHEST 1 VIEW COMPARISON:  Previous studies including the examination of 06/10/2022 FINDINGS: Tip of tracheostomy tube is 5.6 cm above the carina. Tip of right IJ chest port is seen in right atrium. Cardiac size is within normal limits. Central pulmonary vessels are prominent. Haziness in right lower lung fields suggest layering of small to moderate right pleural effusion. Increased density in left lower lung field may  suggest atelectasis/pneumonia. There is slight improvement in aeration in left lower lung field. Left lateral CP angle is clear. There is no pneumothorax. IMPRESSION: Small-to-moderate right pleural effusion. Increased density in both lower lung fields suggest possible atelectasis/pneumonia. Electronically Signed   By: Elmer Picker M.D.   On: 06/12/2022 08:55   DG Chest Port 1 View  Result Date: 06/10/2022 CLINICAL DATA:  [id number].  Hypoxic respiratory failure. EXAM: PORTABLE CHEST 1 VIEW COMPARISON:  Portable chest yesterday at 9:31 a.m. FINDINGS: 4:42 a.m. Tracheostomy cannula tip is 4.6 cm from the carina. Right IJ port catheter again terminates in the upper right atrium. Small or moderate right pleural effusion has increased from today's exam with increased overlying hazy consolidation or atelectasis in the right lower lung field. There is unchanged hazy opacity in the left base. The remaining lungs are clear. The cardiac size and mediastinal silhouette are normal. In all other respects no further changes. IMPRESSION: 1. Worsening small or moderate right pleural effusion and overlying atelectasis or consolidation. 2. Stable hazy interstitial consolidation left base. Electronically Signed   By: Telford Nab M.D.   On: 06/10/2022 07:10   PERIPHERAL VASCULAR CATHETERIZATION  Result Date: 06/09/2022 See surgical note for result.  Portable Chest x-ray  Result Date: 06/09/2022 CLINICAL DATA:  Intubation EXAM: PORTABLE CHEST 1 VIEW COMPARISON:  06/09/2022 FINDINGS: Porta catheter on the right with tip at the upper cavoatrial junction. Tracheostomy tube in place. Hazy density at the bases with small right pleural effusion. Aeration is improved from prior. Stable heart size and mediastinal contours. IMPRESSION: Tracheostomy tube without complicating feature. Improved aeration at the right base. Electronically Signed   By: Jorje Guild M.D.   On: 06/09/2022 09:43   DG Chest Port 1 View  Result  Date: 06/06/2022 CLINICAL DATA:  61 year old female with hypoxia.  History  of cancer. EXAM: PORTABLE CHEST 1 VIEW COMPARISON:  Portable chest 06/03/2022 and earlier. FINDINGS: Portable AP upright view at 0424 hours. Stable right chest power port. Mildly lower lung volumes. Streaky and veiling new right lung base opacity. No pneumothorax. No pulmonary edema suspected. Left lung appears stable and negative. Percutaneous gastrostomy tube visible with negative upper abdominal bowel gas pattern. No acute osseous abnormality identified. IMPRESSION: New right lung base opacity which appears to be a combination of airspace disease and small new pleural effusion. Consider aspiration and/or pneumonia. Electronically Signed   By: Genevie Ann M.D.   On: 06/06/2022 04:36   CT Soft Tissue Neck W Contrast  Result Date: 06/04/2022 CLINICAL DATA:  Hematologic malignancy EXAM: CT NECK WITH CONTRAST TECHNIQUE: Multidetector CT imaging of the neck was performed using the standard protocol following the bolus administration of intravenous contrast. RADIATION DOSE REDUCTION: This exam was performed according to the departmental dose-optimization program which includes automated exposure control, adjustment of the mA and/or kV according to patient size and/or use of iterative reconstruction technique. CONTRAST:  17m OMNIPAQUE IOHEXOL 350 MG/ML SOLN COMPARISON:  05/15/2022 FINDINGS: PHARYNX AND LARYNX: Rightward deviation of the pharynx and upper larynx is unchanged. There is persistent left asymmetric soft tissue thickening at the larynx. The airway is maintained. Post treatment thickening of the epiglottis. Unchanged small retropharyngeal effusion. SALIVARY GLANDS: Post treatment hyperenhancement of the salivary glands is unchanged. THYROID: Normal. LYMPH NODES: Conglomerate nodal mass at left level 2A with areas of central necrosis is unchanged allowing for differences in scan quality and contrast bolus timing. 5 mm right level 1A node  is also unchanged. No new lymphadenopathy. VASCULAR: There is a right chest wall Port-A-Cath the terminates below the field of view. The left internal jugular vein remains occluded. There is narrowing of the distal left ICA due to mass effect by the surrounding soft tissues, unchanged. LIMITED INTRACRANIAL: Normal. VISUALIZED ORBITS: Normal. MASTOIDS AND VISUALIZED PARANASAL SINUSES: No fluid levels or advanced mucosal thickening. No mastoid effusion. SKELETON: No bony spinal canal stenosis. No lytic or blastic lesions. UPPER CHEST: Clear. OTHER: Unchanged cutaneous lesion of the left retroauricular region. IMPRESSION: 1. Unchanged appearance of confluent nodal mass at left level 2A with areas of central necrosis. 2. Unchanged 5 mm right level 1A node. 3. Unchanged cutaneous lesion of the left retroauricular region. 4. 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 Electronically Signed   By: KUlyses JarredM.D.   On: 06/04/2022 00:18   CT HEAD WO CONTRAST (5MM)  Result Date: 06/03/2022 CLINICAL DATA:  Altered mental status, head and neck carcinoma EXAM: CT HEAD WITHOUT CONTRAST TECHNIQUE: Contiguous axial images were obtained from the base of the skull through the vertex without intravenous contrast. RADIATION DOSE REDUCTION: This exam was performed according to the departmental dose-optimization program which includes automated exposure control, adjustment of the mA and/or kV according to patient size and/or use of iterative reconstruction technique. COMPARISON:  MRI 05/16/2022 FINDINGS: Brain: Normal anatomic configuration. Parenchymal volume loss is commensurate with the patient's age. Mild periventricular white matter changes are present likely reflecting the sequela of small vessel ischemia. No abnormal intra or extra-axial mass lesion or fluid collection. No abnormal mass effect or midline shift. No evidence of acute intracranial hemorrhage or infarct. Ventricular size is normal. Cerebellum  unremarkable. Vascular: No asymmetric hyperdense vasculature at the skull base. Skull: Intact Sinuses/Orbits: Paranasal sinuses are clear. Orbits are unremarkable. Other: Mastoid air cells and middle ear cavities are clear. Left retroauricular soft tissue  mass is partially visualized at the inferior margin of the examination, better assessed on CT examination of the neck on 05/15/2022. IMPRESSION: 1. No acute intracranial abnormality. 2. Left retroauricular soft tissue mass partially visualized, better assessed on CT examination of the neck on 05/15/2022. Electronically Signed   By: Fidela Salisbury M.D.   On: 06/03/2022 22:12   DG Chest Port 1 View  Result Date: 06/03/2022 CLINICAL DATA:  Questionable sepsis EXAM: PORTABLE CHEST 1 VIEW COMPARISON:  05/15/2022 FINDINGS: The heart size and mediastinal contours are within normal limits. Right chest port catheter. Both lungs are clear. The visualized skeletal structures are unremarkable. IMPRESSION: No acute abnormality of the lungs in AP portable projection. Electronically Signed   By: Delanna Ahmadi M.D.   On: 06/03/2022 21:20    Microbiology: Results for orders placed or performed during the hospital encounter of 06/03/22  Blood Culture (routine x 2)     Status: None   Collection Time: 06/03/22  9:24 PM   Specimen: BLOOD  Result Value Ref Range Status   Specimen Description BLOOD BLOOD LEFT FOREARM  Final   Special Requests   Final    BOTTLES DRAWN AEROBIC AND ANAEROBIC Blood Culture adequate volume   Culture   Final    NO GROWTH 5 DAYS Performed at [facility], [address] [place], [place] [address]    Report Status 06/08/2022 FINAL  Final  Blood Culture (routine x 2)     Status: None   Collection Time: 06/03/22  9:26 PM   Specimen: BLOOD  Result Value Ref Range Status   Specimen Description BLOOD BLOOD RIGHT FOREARM  Final   Special Requests   Final    BOTTLES DRAWN AEROBIC AND ANAEROBIC Blood Culture results may not be optimal  due to an inadequate volume of blood received in culture bottles   Culture   Final    NO GROWTH 5 DAYS Performed at [facility], [address] [place], [place] [address]    Report Status 06/08/2022 FINAL  Final  Urine Culture     Status: None   Collection Time: 06/04/22 12:20 AM   Specimen: Urine, Random  Result Value Ref Range Status   Specimen Description   Final    URINE, RANDOM Performed at [facility], [address], [place], [place] [address]    Special Requests   Final    NONE Performed at [facility], [address], [place], [place] [address]    Culture   Final    NO GROWTH Performed at [facility], [place] [address] [place], [place] [address]    Report Status 06/05/2022 FINAL  Final  SARS Coronavirus 2 by RT PCR (hospital order, performed in [facility] hospital lab) *cepheid single result test*     Status: None   Collection Time: 06/04/22 12:20 AM   Specimen: Nasal Swab  Result Value Ref Range Status   SARS Coronavirus 2 by RT PCR NEGATIVE NEGATIVE Final    Comment: (NOTE) SARS-CoV-2 target nucleic acids are NOT DETECTED.  The SARS-CoV-2 RNA is generally detectable in upper and lower respiratory specimens during the acute phase of infection. The lowest concentration of SARS-CoV-2 viral copies this assay can detect is 250 copies / mL. A negative result does not preclude SARS-CoV-2 infection and should not be used as the sole basis for treatment or other patient management decisions.  A negative result may occur with improper specimen collection / handling, submission of specimen other  than nasopharyngeal swab, presence of viral mutation(s) within the areas targeted by this assay, and inadequate number of viral copies (<250 copies / mL). A negative result must be combined with clinical observations, patient history, and epidemiological information.  Fact Sheet for Patients:    https://www.patel.info/  Fact Sheet for Healthcare Providers: https://hall.com/  This test is not yet approved or  cleared by the [place] FDA and has been authorized for detection and/or diagnosis of SARS-CoV-2 by FDA under an Emergency Use Authorization (EUA).  This EUA will remain in effect (meaning this test can be used) for the duration of the COVID-19 declaration under Section 564(b)(1) of the Act, 21 U.S.C. section 360bbb-3(b)(1), unless the authorization is terminated or revoked sooner.  Performed at [facility], [address] [place], [place] [address]     Labs: CBC: Recent Labs  Lab 06/15/22 0429 06/16/22 0501 06/17/22 0505 06/18/22 1009 06/18/22 2327 06/19/22 1257  WBC 14.8* 14.7* 16.7* 16.0*  --  17.4*  HGB 8.7* 8.5* 7.8* 7.3* 7.7* 6.6*  HCT 26.3* 26.5* 23.4* 22.8* 24.1* 20.9*  MCV 92.3 94.0 91.8 93.4  --  93.7  PLT 657* 670* 685* 706*  --  272*   Basic Metabolic Panel: Recent Labs  Lab 06/15/22 0429 06/16/22 0501 06/17/22 0505 06/18/22 1009 06/19/22 0532 06/19/22 1316 06/20/22 0952  NA 134* 134* 132* 134*  --  132*  --   K 3.7 3.7 3.5 3.5  --  3.6  --   CL 101 103 101 103  --  101  --   CO2 _0 --  26  --   GLUCOSE 154* 124* 111* 145*  --  178*  --   BUN _1 --  18  --   CREATININE 0.44 0.52 0.51 0.56  --  0.45  --   CALCIUM 8.3* 8.4* 8.5* 8.3*  --  8.5*  --   MG 2.0 1.6* 1.5* 1.7 1.5*  --  2.0  PHOS 3.4 3.5 3.6 3.2 3.3  --  3.6   Liver Function Tests: Recent Labs  Lab 06/19/22 1316  AST 15  ALT 13  ALKPHOS 75  BILITOT <0.1*  PROT 5.9*  ALBUMIN 2.0*   CBG: Recent Labs  Lab 06/19/22 1946 06/20/22 0005 06/20/22 0417 06/20/22 0923 06/20/22 1615  GLUCAP 186* 210* 142* 132* 210*    Discharge time spent: greater than 30 minutes.  This record has been created using Systems analyst. Errors have been sought and corrected,but may not  always be located. Such creation errors do not reflect on the standard of care.   Signed: Lorella Nimrod, MD [facility] 06/20/2022

## 2022-06-21 LAB — TYPE AND SCREEN
ABO/RH(D): B POS
Antibody Screen: NEGATIVE
Unit division: 0
Unit division: 0

## 2022-06-21 LAB — BPAM RBC
Blood Product Expiration Date: 202309172359
Blood Product Expiration Date: 202309182359
ISSUE DATE / TIME: 202308212355
ISSUE DATE / TIME: 202308220318
Unit Type and Rh: 5100
Unit Type and Rh: 7300

## 2022-06-21 NOTE — TOC Transition Note (Signed)
Transition of Care (TOC) - CM/SW Discharge Note   Patient Details  Name: Christina Porter MRN: [id number] Date of Birth: [birth year]  Transition of Care [facility]) CM/SW Contact:  Beverly Sessions, RN Phone Number: 06/21/2022, 11:41 AM   Clinical Narrative:        Signed PDN orders sent to Rob at [facility]  Barriers to Discharge: Continued Medical Work up   Patient Goals and CMS Choice Patient states their goals for this hospitalization and ongoing recovery are:: Home, self care CMS Medicare.gov Compare Post Acute Care list provided to:: Patient    Discharge Placement                       Discharge Plan and Services                                     Social Determinants of Health (SDOH) Interventions     Readmission Risk Interventions     No data to display

## 2022-08-30 DEATH — deceased
